# Patient Record
Sex: Male | Born: 1955 | Race: White | Hispanic: No | Marital: Married | Smoking: Current every day smoker
Health system: Southern US, Community
[De-identification: ages and names within clinical notes are randomized; demographics above are authoritative.]

## PROBLEM LIST (undated history)

## (undated) DIAGNOSIS — E559 Vitamin D deficiency, unspecified: Secondary | ICD-10-CM

## (undated) DIAGNOSIS — Z96659 Presence of unspecified artificial knee joint: Secondary | ICD-10-CM

## (undated) DIAGNOSIS — I739 Peripheral vascular disease, unspecified: Secondary | ICD-10-CM

## (undated) DIAGNOSIS — F419 Anxiety disorder, unspecified: Secondary | ICD-10-CM

## (undated) DIAGNOSIS — J45909 Unspecified asthma, uncomplicated: Secondary | ICD-10-CM

## (undated) DIAGNOSIS — I10 Essential (primary) hypertension: Secondary | ICD-10-CM

## (undated) DIAGNOSIS — I2510 Atherosclerotic heart disease of native coronary artery without angina pectoris: Secondary | ICD-10-CM

## (undated) DIAGNOSIS — R51 Headache: Secondary | ICD-10-CM

## (undated) DIAGNOSIS — K649 Unspecified hemorrhoids: Secondary | ICD-10-CM

## (undated) DIAGNOSIS — N400 Enlarged prostate without lower urinary tract symptoms: Secondary | ICD-10-CM

## (undated) DIAGNOSIS — R519 Headache, unspecified: Secondary | ICD-10-CM

## (undated) DIAGNOSIS — R7303 Prediabetes: Secondary | ICD-10-CM

## (undated) DIAGNOSIS — J449 Chronic obstructive pulmonary disease, unspecified: Secondary | ICD-10-CM

## (undated) DIAGNOSIS — K219 Gastro-esophageal reflux disease without esophagitis: Secondary | ICD-10-CM

## (undated) DIAGNOSIS — M5030 Other cervical disc degeneration, unspecified cervical region: Secondary | ICD-10-CM

## (undated) DIAGNOSIS — G4700 Insomnia, unspecified: Secondary | ICD-10-CM

## (undated) DIAGNOSIS — M1711 Unilateral primary osteoarthritis, right knee: Secondary | ICD-10-CM

## (undated) DIAGNOSIS — E785 Hyperlipidemia, unspecified: Secondary | ICD-10-CM

## (undated) DIAGNOSIS — F330 Major depressive disorder, recurrent, mild: Secondary | ICD-10-CM

## (undated) DIAGNOSIS — K429 Umbilical hernia without obstruction or gangrene: Secondary | ICD-10-CM

## (undated) DIAGNOSIS — M51369 Other intervertebral disc degeneration, lumbar region without mention of lumbar back pain or lower extremity pain: Secondary | ICD-10-CM

## (undated) DIAGNOSIS — M5136 Other intervertebral disc degeneration, lumbar region: Secondary | ICD-10-CM

## (undated) DIAGNOSIS — K2270 Barrett's esophagus without dysplasia: Secondary | ICD-10-CM

## (undated) DIAGNOSIS — I77819 Aortic ectasia, unspecified site: Secondary | ICD-10-CM

## (undated) DIAGNOSIS — G629 Polyneuropathy, unspecified: Secondary | ICD-10-CM

## (undated) DIAGNOSIS — T8459XA Infection and inflammatory reaction due to other internal joint prosthesis, initial encounter: Secondary | ICD-10-CM

## (undated) DIAGNOSIS — F17200 Nicotine dependence, unspecified, uncomplicated: Secondary | ICD-10-CM

## (undated) DIAGNOSIS — M1990 Unspecified osteoarthritis, unspecified site: Secondary | ICD-10-CM

## (undated) DIAGNOSIS — Z720 Tobacco use: Secondary | ICD-10-CM

## (undated) DIAGNOSIS — G609 Hereditary and idiopathic neuropathy, unspecified: Secondary | ICD-10-CM

## (undated) DIAGNOSIS — J329 Chronic sinusitis, unspecified: Secondary | ICD-10-CM

## (undated) HISTORY — PX: REPLACEMENT UNICONDYLAR JOINT KNEE: SUR1227

## (undated) HISTORY — PX: APPENDECTOMY: SHX54

## (undated) HISTORY — PX: OTHER SURGICAL HISTORY: SHX169

## (undated) HISTORY — PX: SUPERFICIAL PERONEAL NERVE RELEASE: SHX6200

## (undated) HISTORY — PX: BACK SURGERY: SHX140

## (undated) HISTORY — PX: HERNIA REPAIR: SHX51

## (undated) HISTORY — PX: JOINT REPLACEMENT: SHX530

## (undated) HISTORY — PX: KNEE ARTHROSCOPY: SUR90

---

## 2005-08-25 ENCOUNTER — Emergency Department: Payer: Self-pay | Admitting: Emergency Medicine

## 2005-11-09 ENCOUNTER — Emergency Department: Payer: Self-pay | Admitting: Emergency Medicine

## 2006-01-08 ENCOUNTER — Ambulatory Visit: Payer: Self-pay | Admitting: Gastroenterology

## 2006-08-17 ENCOUNTER — Ambulatory Visit: Payer: Self-pay | Admitting: Urology

## 2006-10-10 ENCOUNTER — Ambulatory Visit: Payer: Self-pay | Admitting: Family Medicine

## 2007-03-16 ENCOUNTER — Ambulatory Visit: Payer: Self-pay | Admitting: Family Medicine

## 2007-03-19 ENCOUNTER — Ambulatory Visit: Payer: Self-pay | Admitting: Family Medicine

## 2007-10-20 ENCOUNTER — Ambulatory Visit: Payer: Self-pay | Admitting: Unknown Physician Specialty

## 2007-12-13 ENCOUNTER — Ambulatory Visit: Payer: Self-pay | Admitting: Pain Medicine

## 2008-03-18 ENCOUNTER — Other Ambulatory Visit: Payer: Self-pay

## 2008-03-18 ENCOUNTER — Emergency Department: Payer: Self-pay | Admitting: Emergency Medicine

## 2008-04-06 ENCOUNTER — Ambulatory Visit: Payer: Self-pay | Admitting: Unknown Physician Specialty

## 2008-04-12 ENCOUNTER — Encounter: Payer: Self-pay | Admitting: Unknown Physician Specialty

## 2008-05-06 ENCOUNTER — Encounter: Payer: Self-pay | Admitting: Unknown Physician Specialty

## 2008-07-17 ENCOUNTER — Ambulatory Visit: Payer: Self-pay | Admitting: Psychology

## 2008-08-03 ENCOUNTER — Ambulatory Visit: Payer: Self-pay | Admitting: Internal Medicine

## 2008-08-30 ENCOUNTER — Ambulatory Visit: Payer: Self-pay | Admitting: Gastroenterology

## 2008-09-14 ENCOUNTER — Ambulatory Visit: Payer: Self-pay | Admitting: Gastroenterology

## 2008-11-09 ENCOUNTER — Ambulatory Visit: Payer: Self-pay | Admitting: Surgery

## 2009-06-21 ENCOUNTER — Emergency Department: Payer: Self-pay | Admitting: Emergency Medicine

## 2009-10-02 ENCOUNTER — Ambulatory Visit: Payer: Self-pay | Admitting: Unknown Physician Specialty

## 2009-10-06 ENCOUNTER — Ambulatory Visit: Payer: Self-pay | Admitting: Internal Medicine

## 2009-10-07 ENCOUNTER — Emergency Department: Payer: Self-pay | Admitting: Unknown Physician Specialty

## 2009-10-12 ENCOUNTER — Ambulatory Visit: Payer: Self-pay | Admitting: Internal Medicine

## 2009-10-17 ENCOUNTER — Ambulatory Visit: Payer: Self-pay | Admitting: Internal Medicine

## 2009-11-06 ENCOUNTER — Ambulatory Visit: Payer: Self-pay | Admitting: Internal Medicine

## 2010-01-04 ENCOUNTER — Ambulatory Visit: Payer: Self-pay | Admitting: Gastroenterology

## 2011-10-13 ENCOUNTER — Ambulatory Visit: Payer: Self-pay | Admitting: Family Medicine

## 2011-10-31 ENCOUNTER — Ambulatory Visit: Payer: Self-pay | Admitting: Gastroenterology

## 2011-11-20 ENCOUNTER — Ambulatory Visit: Payer: Self-pay | Admitting: Gastroenterology

## 2011-11-24 LAB — PATHOLOGY REPORT

## 2012-01-27 ENCOUNTER — Ambulatory Visit: Payer: Self-pay | Admitting: Gastroenterology

## 2012-02-03 ENCOUNTER — Ambulatory Visit: Payer: Self-pay | Admitting: Gastroenterology

## 2012-04-19 ENCOUNTER — Inpatient Hospital Stay: Payer: Self-pay | Admitting: Internal Medicine

## 2012-04-19 LAB — COMPREHENSIVE METABOLIC PANEL
Anion Gap: 11 (ref 7–16)
BUN: 12 mg/dL (ref 7–18)
Bilirubin,Total: 0.8 mg/dL (ref 0.2–1.0)
Chloride: 101 mmol/L (ref 98–107)
Co2: 27 mmol/L (ref 21–32)
Creatinine: 2.5 mg/dL — ABNORMAL HIGH (ref 0.60–1.30)
EGFR (African American): 32 — ABNORMAL LOW
Potassium: 3.6 mmol/L (ref 3.5–5.1)
Sodium: 139 mmol/L (ref 136–145)
Total Protein: 7.6 g/dL (ref 6.4–8.2)

## 2012-04-19 LAB — CK TOTAL AND CKMB (NOT AT ARMC): CK, Total: 226 U/L (ref 35–232)

## 2012-04-19 LAB — CBC
HCT: 48.6 % (ref 40.0–52.0)
MCHC: 34.1 g/dL (ref 32.0–36.0)
RDW: 13.6 % (ref 11.5–14.5)

## 2012-04-19 LAB — URINALYSIS, COMPLETE
Blood: NEGATIVE
Glucose,UR: NEGATIVE mg/dL (ref 0–75)
Hyaline Cast: 37
Ph: 5 (ref 4.5–8.0)
Squamous Epithelial: <1

## 2012-04-20 LAB — CBC WITH DIFFERENTIAL/PLATELET
Basophil #: 0.1 10*3/uL (ref 0.0–0.1)
Basophil %: 1.7 %
Eosinophil #: 0.2 10*3/uL (ref 0.0–0.7)
HCT: 44.4 % (ref 40.0–52.0)
HGB: 15.3 g/dL (ref 13.0–18.0)
Lymphocyte #: 2.2 10*3/uL (ref 1.0–3.6)
MCH: 34.6 pg — ABNORMAL HIGH (ref 26.0–34.0)
MCHC: 34.5 g/dL (ref 32.0–36.0)
MCV: 100 fL (ref 80–100)
Monocyte #: 0.5 x10 3/mm (ref 0.2–1.0)
Neutrophil #: 2.6 10*3/uL (ref 1.4–6.5)
RBC: 4.42 10*6/uL (ref 4.40–5.90)
RDW: 13.7 % (ref 11.5–14.5)
WBC: 5.6 10*3/uL (ref 3.8–10.6)

## 2012-04-20 LAB — BASIC METABOLIC PANEL
Calcium, Total: 8.6 mg/dL (ref 8.5–10.1)
Chloride: 106 mmol/L (ref 98–107)
EGFR (African American): >60
EGFR (Non-African Amer.): >60
Glucose: 119 mg/dL — ABNORMAL HIGH (ref 65–99)
Sodium: 142 mmol/L (ref 136–145)

## 2012-04-20 LAB — TROPONIN I: Troponin-I: <0.02 ng/mL

## 2012-04-25 LAB — CULTURE, BLOOD (SINGLE)

## 2012-10-13 ENCOUNTER — Other Ambulatory Visit: Payer: Self-pay | Admitting: Neurology

## 2012-10-13 DIAGNOSIS — M5430 Sciatica, unspecified side: Secondary | ICD-10-CM

## 2012-10-22 ENCOUNTER — Ambulatory Visit
Admission: RE | Admit: 2012-10-22 | Discharge: 2012-10-22 | Disposition: A | Discharge status: Home / Self Care | Payer: PRIVATE HEALTH INSURANCE | Source: Ambulatory Visit | Attending: Neurology | Admitting: Neurology

## 2012-10-22 DIAGNOSIS — M5430 Sciatica, unspecified side: Secondary | ICD-10-CM

## 2013-03-03 ENCOUNTER — Telehealth: Payer: Self-pay | Admitting: Neurology

## 2013-11-23 IMAGING — US US ABDOMEN LIMITED SLG ORGAN/ASCITES
1 series · 17 of 25 positions shown · non-contrast
Comparison: none

REASON FOR EXAM: RUQ abd pain gallbladder
COMMENTS:

[Series 1: us abdomen limited slg organ/ascites · 17 of 57 slices shown]
[im 1/57]
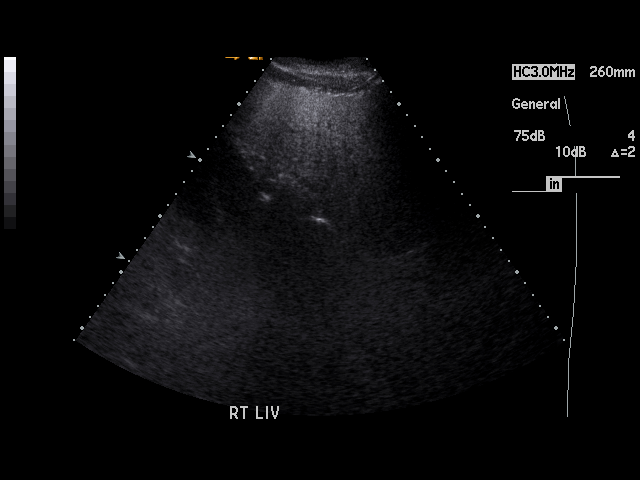
[im 5/57]
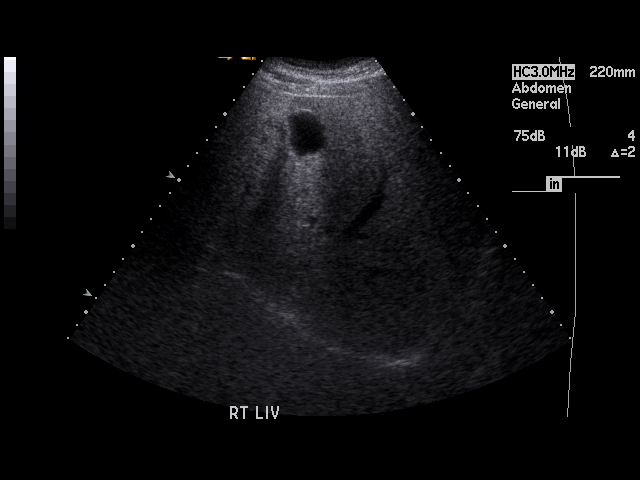
[im 8/57]
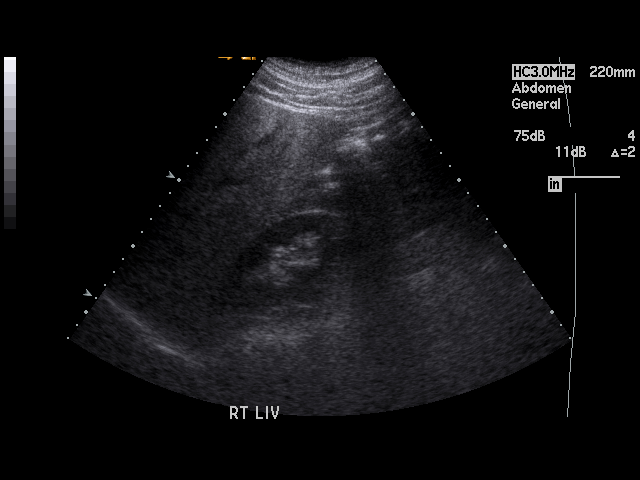
[im 12/57]
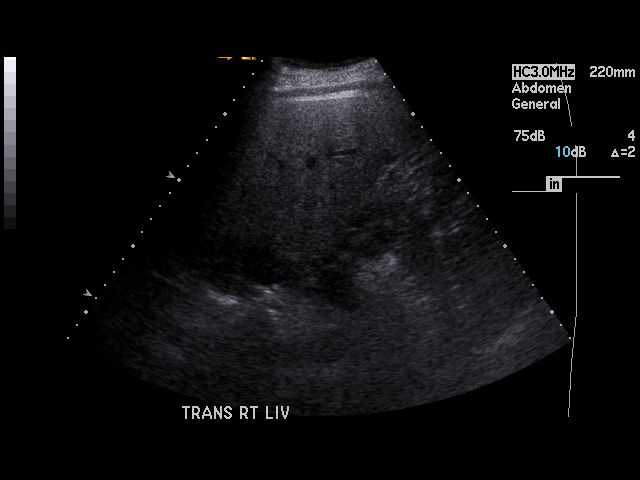
[im 15/57]
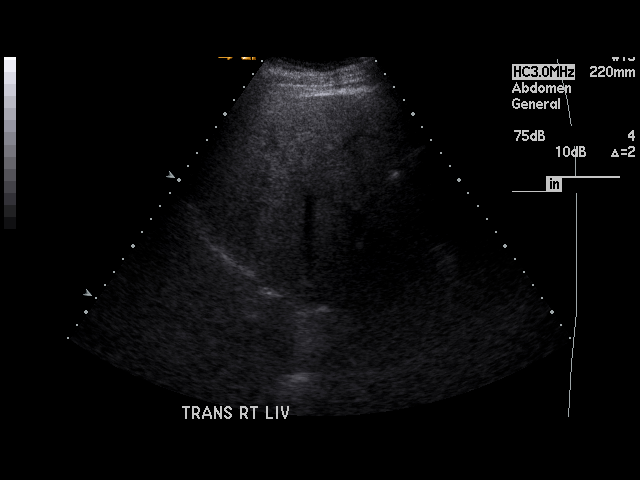
[im 19/57]
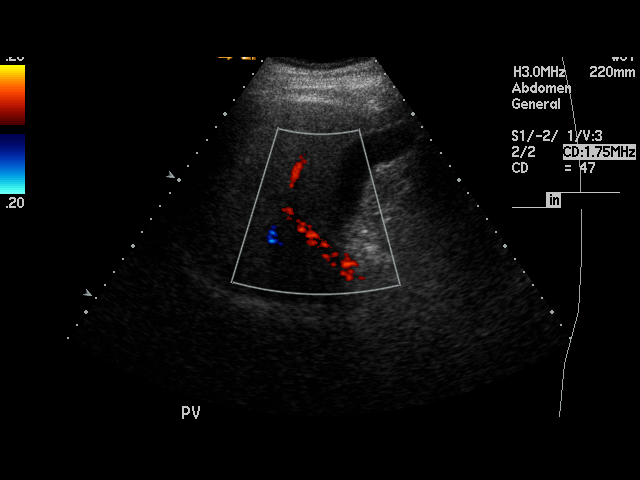
[im 22/57]
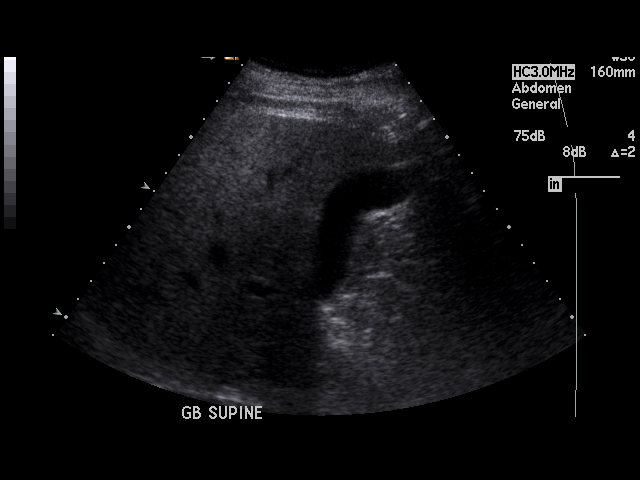
[im 26/57]
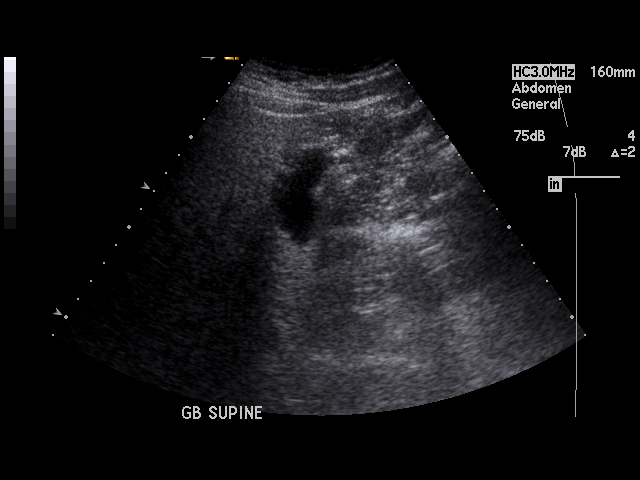
[im 29/57]
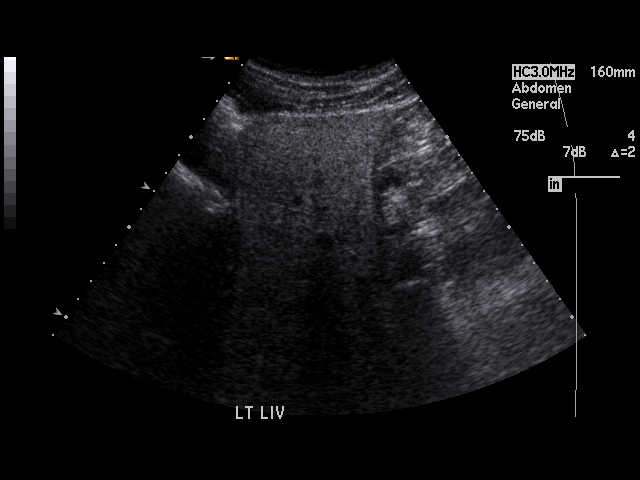
[im 31/57]
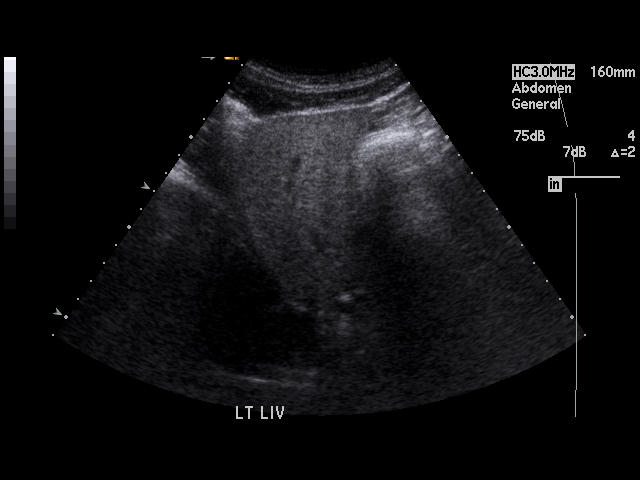
[im 36/57]
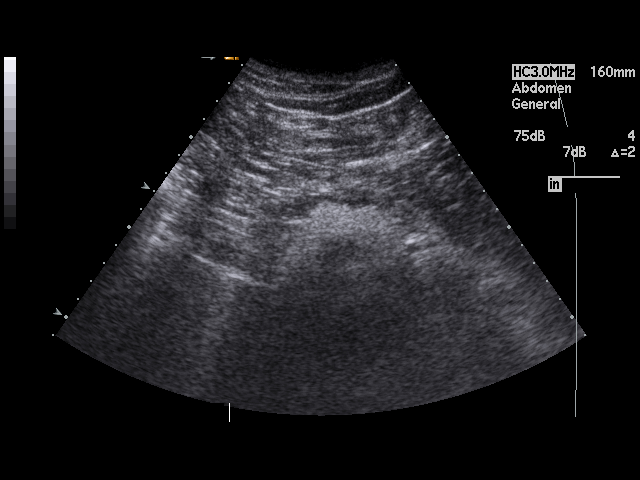
[im 38/57]
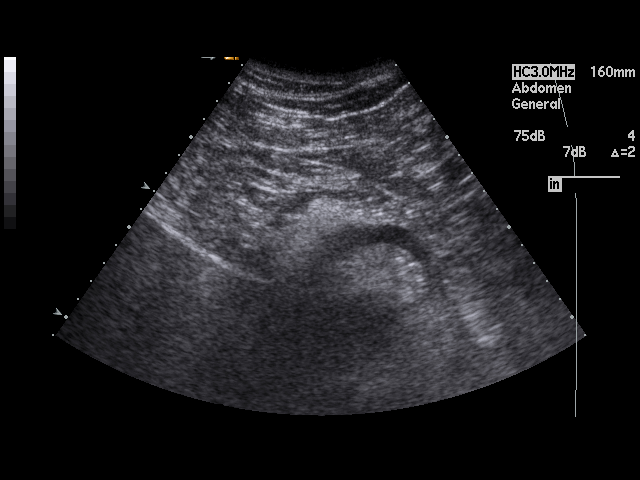
[im 43/57]
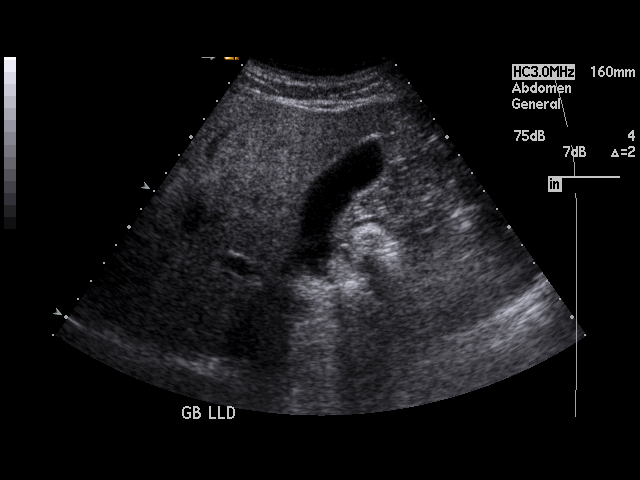
[im 45/57]
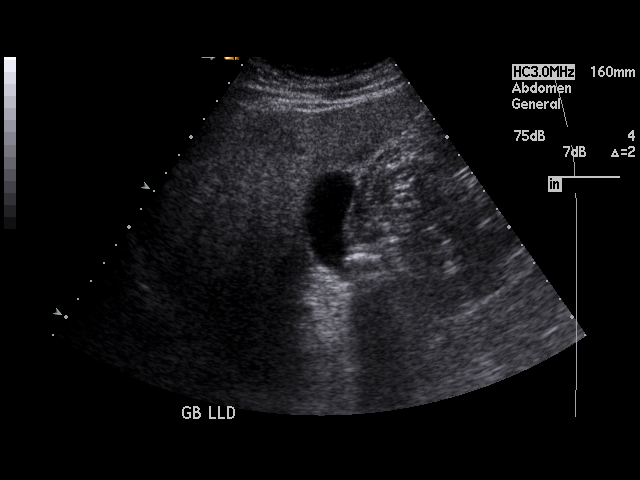
[im 50/57]
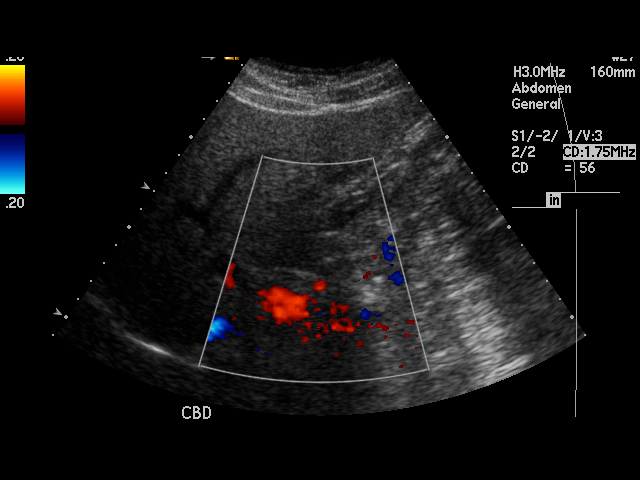
[im 52/57]
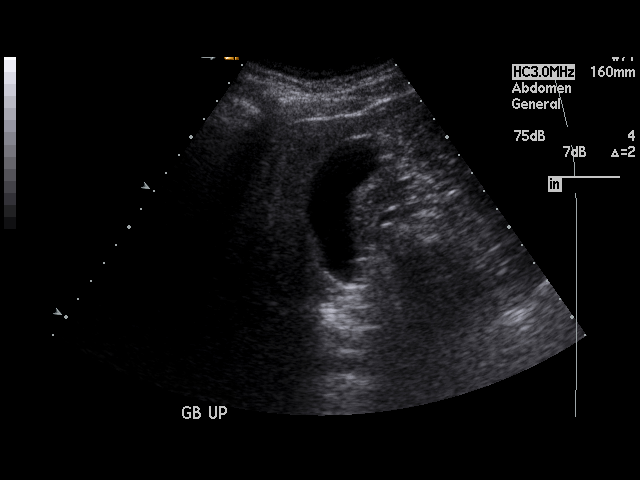
[im 57/57]
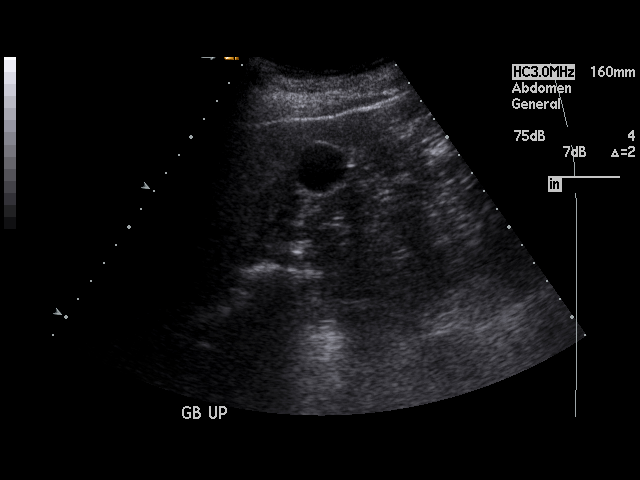

[17 of 25 positions shown; findings below may reference images not displayed]

PROCEDURE:     BRAIN - BRAIN ABDOMEN LTD 1 ORGAN OR QUAD  - October 13, 2011  [DATE]

RESULT:     Limited abdominal ultrasound examination was performed for
evaluation of the biliary tract. No gallstones are seen. There is no
thickening of the gallbladder wall. Common bile duct measures 4.8 mm in
number which is within normal limits. The pancreas is not visualized
adequately for evaluation. The region of the porta hepatis shows no
significant abnormalities. The liver appears hyperechogenic, suspicious for
fatty infiltration.
IMPRESSION: 1. No gallstones or other acute change is identified.
2. Possible fatty infiltration of the liver.

## 2013-12-14 NOTE — Telephone Encounter (Signed)
Pt never called to schedule an apt. Closing encounter

## 2014-03-21 DIAGNOSIS — G609 Hereditary and idiopathic neuropathy, unspecified: Secondary | ICD-10-CM | POA: Insufficient documentation

## 2014-03-21 DIAGNOSIS — I10 Essential (primary) hypertension: Secondary | ICD-10-CM | POA: Insufficient documentation

## 2014-03-21 DIAGNOSIS — K649 Unspecified hemorrhoids: Secondary | ICD-10-CM | POA: Insufficient documentation

## 2014-03-21 DIAGNOSIS — E559 Vitamin D deficiency, unspecified: Secondary | ICD-10-CM | POA: Insufficient documentation

## 2014-03-21 DIAGNOSIS — J449 Chronic obstructive pulmonary disease, unspecified: Secondary | ICD-10-CM | POA: Insufficient documentation

## 2014-04-27 ENCOUNTER — Emergency Department: Payer: Self-pay | Admitting: Emergency Medicine

## 2014-04-27 LAB — URINALYSIS, COMPLETE
BILIRUBIN, UR: NEGATIVE
Bacteria: NONE SEEN
Blood: NEGATIVE
GLUCOSE, UR: NEGATIVE mg/dL (ref 0–75)
Ketone: NEGATIVE
LEUKOCYTE ESTERASE: NEGATIVE
Nitrite: NEGATIVE
PROTEIN: NEGATIVE
Ph: 7 (ref 4.5–8.0)
RBC,UR: <1 /HPF (ref 0–5)
SQUAMOUS EPITHELIAL: NONE SEEN
Specific Gravity: 1.004 (ref 1.003–1.030)

## 2014-04-27 LAB — CBC WITH DIFFERENTIAL/PLATELET
Basophil #: 0.2 10*3/uL — ABNORMAL HIGH (ref 0.0–0.1)
Basophil %: 2.1 %
Eosinophil #: 0.2 10*3/uL (ref 0.0–0.7)
Eosinophil %: 2.2 %
HCT: 45 % (ref 40.0–52.0)
HGB: 15.2 g/dL (ref 13.0–18.0)
LYMPHS PCT: 24.2 %
Lymphocyte #: 1.8 10*3/uL (ref 1.0–3.6)
MCH: 34.5 pg — ABNORMAL HIGH (ref 26.0–34.0)
MCHC: 33.8 g/dL (ref 32.0–36.0)
MCV: 102 fL — ABNORMAL HIGH (ref 80–100)
MONOS PCT: 8.5 %
Monocyte #: 0.7 x10 3/mm (ref 0.2–1.0)
Neutrophil #: 4.8 10*3/uL (ref 1.4–6.5)
Neutrophil %: 63 %
PLATELETS: 177 10*3/uL (ref 150–440)
RBC: 4.41 10*6/uL (ref 4.40–5.90)
RDW: 13.9 % (ref 11.5–14.5)
WBC: 7.6 10*3/uL (ref 3.8–10.6)

## 2014-04-27 LAB — COMPREHENSIVE METABOLIC PANEL
ALT: 37 U/L
ANION GAP: 6 — AB (ref 7–16)
Albumin: 3.6 g/dL (ref 3.4–5.0)
Alkaline Phosphatase: 80 U/L
BILIRUBIN TOTAL: 0.5 mg/dL (ref 0.2–1.0)
BUN: 11 mg/dL (ref 7–18)
CHLORIDE: 107 mmol/L (ref 98–107)
Calcium, Total: 8.5 mg/dL (ref 8.5–10.1)
Co2: 25 mmol/L (ref 21–32)
Creatinine: 0.93 mg/dL (ref 0.60–1.30)
EGFR (African American): >60
Glucose: 118 mg/dL — ABNORMAL HIGH (ref 65–99)
Osmolality: 276 (ref 275–301)
POTASSIUM: 3.7 mmol/L (ref 3.5–5.1)
SGOT(AST): 25 U/L (ref 15–37)
Sodium: 138 mmol/L (ref 136–145)
Total Protein: 7.3 g/dL (ref 6.4–8.2)

## 2014-04-27 LAB — LIPASE, BLOOD: Lipase: 98 U/L (ref 73–393)

## 2014-04-27 LAB — TROPONIN I: Troponin-I: <0.02 ng/mL

## 2014-04-27 LAB — MAGNESIUM: Magnesium: 2.2 mg/dL

## 2014-07-22 ENCOUNTER — Emergency Department: Payer: Self-pay | Admitting: Emergency Medicine

## 2014-07-22 LAB — URINALYSIS, COMPLETE
Bacteria: NONE SEEN
Bilirubin,UR: NEGATIVE
Blood: NEGATIVE
GLUCOSE, UR: NEGATIVE mg/dL (ref 0–75)
KETONE: NEGATIVE
LEUKOCYTE ESTERASE: NEGATIVE
Nitrite: NEGATIVE
PROTEIN: NEGATIVE
Ph: 6 (ref 4.5–8.0)
RBC,UR: <1 /HPF (ref 0–5)
Specific Gravity: 1.005 (ref 1.003–1.030)
Squamous Epithelial: NONE SEEN
WBC UR: <1 /HPF (ref 0–5)

## 2015-01-12 ENCOUNTER — Ambulatory Visit
Admit: 2015-01-12 | Disposition: A | Discharge status: Home / Self Care | Payer: Self-pay | Attending: Gastroenterology | Admitting: Gastroenterology

## 2015-01-12 LAB — KOH PREP

## 2015-01-23 NOTE — Discharge Summary (Signed)
PATIENT NAME:  Anthony Bender, Anthony Bender MR#:  [id number] DATE OF BIRTH:  [birth year]  DATE OF ADMISSION:  04/19/2012 DATE OF DISCHARGE:  04/20/2012  DISCHARGE DIAGNOSES:  1. Acute renal failure, likely prerenal in nature, now resolved status post aggressive IV hydration. Stopped hydrochlorothiazide, which could be a contributing factor.  2. Hypokalemia, repleted.   SECONDARY DIAGNOSES:  1. Hypertension.  2. Chronic obstructive pulmonary disease.  3. Gastroesophageal reflux disease.  4. Hiatal hernia.   CONSULTATIONS: None.   PROCEDURES/RADIOLOGY:  1. Chest x-ray on 07/15 showed minimal bibasilar atelectasis.  2. Bilateral renal ultrasound on 07/16 showed no hydronephrosis.  3. Left lower extremity Doppler on 07/15 showed no evidence of deep vein thrombosis.   MAJOR LABORATORY PANEL: Urinalysis on admission was negative.   HISTORY AND SHORT HOSPITAL COURSE: The patient is a 59 year old male with above-mentioned medical problems who was admitted for acute renal failure thought to be secondary to prerenal etiology with dehydration and use of hydrochlorothiazide. The patient was having poor p.o. intake recently. Please see Dr. Marshia Ly dictated history and physical for further details. The patient was hydrated aggressively with IV fluids and his hydrochlorothiazide/losartan combination was held. His blood pressure was stable and he is renal function normalized. His symptoms were also significantly improved with aggressive IV hydration. He had some hypokalemia, which was repleted, and he was feeling much better and was discharged home in stable condition.   VITAL SIGNS: On the date of discharge, his vital signs are as follows: Temperature 97.9, heart rate 72 per minute, respirations 20 per minute, blood pressure 126/85 mmHg. He was saturating 96% on room air.   PERTINENT PHYSICAL EXAMINATION: On the date of discharge: CARDIOVASCULAR: S1, S2 normal. No murmur, rubs, gallop. LUNGS: Clear to auscultation  bilaterally. No wheezes, rales, rhonchi, or crepitation. ABDOMEN: Soft, benign. NEUROLOGIC: Nonfocal examination. All other physical examination remained at baseline.   DISCHARGE MEDICATIONS: 1. Nexium 40 mg p.o. b.i.d.  2. Spiriva once daily.  3. Advair 250/50, 1 puff b.i.d.  4. Ventolin 2 puffs inhaled every four hours as needed.  5. Neurontin 600 mg p.o. 3 times a day.  6. Ambien 10 mg p.o. at bedtime.  7. Gemfibrozil twice a day. 8. Losartan 50 mg p.o. daily.   DISCHARGE DIET: Low sodium.   DISCHARGE ACTIVITY: As tolerated.  DISCHARGE INSTRUCTIONS AND FOLLOWUP:  1. The patient was instructed to stop his hydrochlorothiazide/losartan combination.  2. He was instructed to follow up with his primary care physician, Dr. Juluis Pitch, in 1 to 2 weeks.   TOTAL TIME DISCHARGING THIS PATIENT: 50 minutes. ____________________________ Lucina Mellow. Manuella Ghazi, MD vss:bjt D: 04/20/2012 20:51:55 ET T: 04/21/2012 12:31:59 ET JOB#: [id number]  cc: Aikeem Lilley S. Manuella Ghazi, MD, <Dictator> Youlanda Roys. Lovie Macadamia, MD Remer Macho MD ELECTRONICALLY SIGNED 04/21/2012 16:50

## 2015-01-23 NOTE — H&P (Signed)
PATIENT NAME:  Anthony Bender, Anthony Bender MR#:  [id number] DATE OF BIRTH:  Jul 22, 1956  DATE OF ADMISSION:  04/20/2012  PRIMARY CARE PHYSICIAN: Dr. Lovie Macadamia   CHIEF COMPLAINT: Dizziness.   HISTORY OF PRESENT ILLNESS: This is a 59 year old man with history of hypertension, who takes lisinopril hydrochlorothiazide. He got dizzy. He went to take his blood pressure. It was 75/56. He had some neck pain, center of his back pain and shoulder blade pain and leg pain at the time. These pains got better after IV fluid hydration and eased up around 6:00 p.m. Patient was in the Emergency Room for over five hours, had an ultrasound of the left upper extremity that was negative for deep vein thrombosis. He was found in acute renal failure and he was hypotensive He has been feeling fatigued. Hospitalist services were contacted for further evaluation.   PAST MEDICAL HISTORY:  1. Chronic obstructive pulmonary disease.  2. Hypertension.  3. Neuropathy of the feet. 4. Gastroesophageal reflux disease. 5. Hiatal hernia.   PAST SURGICAL HISTORY:  1. Four knee arthroscopies.   2. Appendix.  3. Inguinal hernia. 4. Fatty tumor on the back. 5. Right arm nerve release. 6. Fifth finger on the left hand reattachment.   ALLERGIES: Codeine and Zyban.   MEDICATIONS:  1. Advair Diskus 250/50, 1 inhalation twice a day. 2. Hydrochlorothiazide/losartan 12.5/50 mg daily.  3. Neurontin 600 mg 3 times a day.  4. Nexium 40 mg twice a day.  5. Spiriva 18 mcg 1 inhalation daily.  6. Ventolin HFA 2 puffs every four hours as needed for wheezing.  7. Ambien 10 mg at bedtime.   SOCIAL HISTORY: Smokes half pack per day. No alcohol. No drug use. Works as a Air traffic controller.   FAMILY HISTORY: Mother living with CVA. He had tuberculosis as a child. Father died of an aneurysm in the chest or abdomen. He had chronic obstructive pulmonary disease and prostate cancer.   REVIEW OF SYSTEMS: CONSTITUTIONAL: Positive for fatigue. No  fever, chills, or sweats. No weight gain. No weight loss. EYES: He does wear glasses. EARS, NOSE, MOUTH, AND THROAT: Positive for ringing in the ears. Positive for jaw tightness while trying to eat today. CARDIOVASCULAR: No chest pain. Positive for palpitations. RESPIRATORY: Positive for shortness of breath. No coughing. No sputum. No hemoptysis. GASTROINTESTINAL: No nausea. No vomiting. No abdominal pain. No diarrhea. No constipation. No bright red blood per rectum. No melena. GENITOURINARY: No burning on urination. Decreased urination today. MUSCULOSKELETAL: Positive for joint pains. INTEGUMENT: Positive for itching on the back. NEUROLOGIC: No fainting or blackouts. PSYCHIATRIC: No anxiety, depression. ENDOCRINE: No thyroid problems. HEMATOLOGIC/LYMPHATIC: No anemia. No easy bruising or bleeding.   PHYSICAL EXAMINATION:  VITAL SIGNS: Temperature 98.5, pulse 70, respirations 18, blood pressure 101/60, pulse oximetry 96%, on presentation blood pressure was 101/59.   GENERAL: No respiratory distress.   EYES: Conjunctivae and lids normal. Pupils equal, round, and reactive to light. Extraocular muscles intact. No nystagmus.   EARS, NOSE, MOUTH, AND THROAT: Tympanic membranes no erythema. Nasal mucosa no erythema. Throat no erythema. No exudate seen. Lips and gums no lesions.   NECK: No JVD. No bruits. No lymphadenopathy. No thyromegaly. No thyroid nodules palpated.   RESPIRATORY: Lungs clear to auscultation. No use of accessory muscles to breathe. No rhonchi, rales, or wheeze heard.   CARDIOVASCULAR: S1, S2 normal. No gallops, rubs, or murmurs heard. Carotid upstroke 2+ bilaterally. No bruits.   EXTREMITIES: Dorsalis pedis pulses 2+ bilaterally. All pulses equal throughout upper and lower  extremities.   ABDOMEN: Soft, nontender. No organomegaly/splenomegaly. Normoactive bowel sounds. No masses felt.   LYMPHATIC: No lymph nodes in the neck.   MUSCULOSKELETAL: No clubbing, edema, or cyanosis.    SKIN: No rashes or ulcers seen.   NEUROLOGIC: Cranial nerves II through XII grossly intact. Deep tendon reflexes 2+ bilateral lower extremities.   PSYCHIATRIC: Patient is oriented to person, place, and time.   LABORATORY, DIAGNOSTIC, AND RADIOLOGICAL DATA: EKG normal sinus rhythm, 98 beats per minute, no acute ST-T wave changes. Left atrial enlargement. Ultrasound the left lower extremity negative for deep vein thrombosis. White blood cell count 11.3, hemoglobin and hematocrit 16.6 and 48.8, platelet count 208, MCV 101, glucose 128, BUN 12, creatinine 2.5, sodium 139, potassium 3.6, chloride 101, CO2 27, calcium 9.0. Liver function tests: AST slightly elevated at 38, GFR 27. Troponin negative. Urinalysis 1+ bacteria, nitrites and leukocyte esterase negative, ketones trace. D-dimer negative.   ASSESSMENT AND PLAN:  1. Acute renal failure with hypotension, probable ATN. Will give IV fluid hydration. Patient received 2 liters open, will give 125 mL/h. Will hold lisinopril/HCT. Will get a renal ultrasound, strict ins and outs. Hospital course will depend on how his kidney function and blood pressure respond.  2. Chronic obstructive pulmonary disease. Respiratory status stable. Continue Advair and Spiriva and Ventolin inhaler.  3. Tobacco abuse. Smoking cessation counseling done, three minutes by me. Nicotine patch applied.  4. Gastroesophageal reflux disease and hiatal hernia. Continue Nexium.  5. Neuropathy. Continue gabapentin.   TIME SPENT ON ADMISSION: 50 minutes.   ____________________________ Tana Conch. Leslye Peer, MD rjw:cms D: 04/19/2012 23:19:39 ET T: 04/20/2012 09:14:04 ET JOB#: [id number]  cc: Tana Conch. Leslye Peer, MD, <Dictator> Youlanda Roys. Lovie Macadamia, MD Marisue Brooklyn MD ELECTRONICALLY SIGNED 04/24/2012 16:03

## 2015-01-29 LAB — SURGICAL PATHOLOGY

## 2015-02-26 ENCOUNTER — Other Ambulatory Visit: Payer: Self-pay | Admitting: Unknown Physician Specialty

## 2015-02-26 DIAGNOSIS — M25562 Pain in left knee: Secondary | ICD-10-CM

## 2015-02-26 DIAGNOSIS — M25561 Pain in right knee: Secondary | ICD-10-CM

## 2015-02-26 DIAGNOSIS — M170 Bilateral primary osteoarthritis of knee: Secondary | ICD-10-CM

## 2015-03-02 ENCOUNTER — Ambulatory Visit: Payer: BLUE CROSS/BLUE SHIELD

## 2015-03-02 ENCOUNTER — Ambulatory Visit: Admission: RE | Admit: 2015-03-02 | Payer: BLUE CROSS/BLUE SHIELD | Source: Ambulatory Visit

## 2015-03-07 DIAGNOSIS — M1711 Unilateral primary osteoarthritis, right knee: Secondary | ICD-10-CM | POA: Insufficient documentation

## 2015-03-13 ENCOUNTER — Encounter: Payer: Self-pay | Admitting: *Deleted

## 2015-03-15 ENCOUNTER — Encounter
Admission: RE | Disposition: A | Discharge status: Home / Self Care | Payer: Self-pay | Source: Ambulatory Visit | Attending: Gastroenterology

## 2015-03-15 ENCOUNTER — Ambulatory Visit: Payer: BLUE CROSS/BLUE SHIELD | Admitting: Anesthesiology

## 2015-03-15 ENCOUNTER — Ambulatory Visit
Admission: RE | Admit: 2015-03-15 | Discharge: 2015-03-15 | Disposition: A | Discharge status: Home / Self Care | Payer: BLUE CROSS/BLUE SHIELD | Source: Ambulatory Visit | Attending: Gastroenterology | Admitting: Gastroenterology

## 2015-03-15 DIAGNOSIS — G629 Polyneuropathy, unspecified: Secondary | ICD-10-CM | POA: Insufficient documentation

## 2015-03-15 DIAGNOSIS — I10 Essential (primary) hypertension: Secondary | ICD-10-CM | POA: Insufficient documentation

## 2015-03-15 DIAGNOSIS — N400 Enlarged prostate without lower urinary tract symptoms: Secondary | ICD-10-CM | POA: Insufficient documentation

## 2015-03-15 DIAGNOSIS — M1990 Unspecified osteoarthritis, unspecified site: Secondary | ICD-10-CM | POA: Insufficient documentation

## 2015-03-15 DIAGNOSIS — Z791 Long term (current) use of non-steroidal anti-inflammatories (NSAID): Secondary | ICD-10-CM | POA: Diagnosis not present

## 2015-03-15 DIAGNOSIS — K219 Gastro-esophageal reflux disease without esophagitis: Secondary | ICD-10-CM | POA: Insufficient documentation

## 2015-03-15 DIAGNOSIS — K2270 Barrett's esophagus without dysplasia: Secondary | ICD-10-CM | POA: Insufficient documentation

## 2015-03-15 DIAGNOSIS — E785 Hyperlipidemia, unspecified: Secondary | ICD-10-CM | POA: Insufficient documentation

## 2015-03-15 DIAGNOSIS — J45909 Unspecified asthma, uncomplicated: Secondary | ICD-10-CM | POA: Insufficient documentation

## 2015-03-15 DIAGNOSIS — Z79899 Other long term (current) drug therapy: Secondary | ICD-10-CM | POA: Insufficient documentation

## 2015-03-15 DIAGNOSIS — J449 Chronic obstructive pulmonary disease, unspecified: Secondary | ICD-10-CM | POA: Insufficient documentation

## 2015-03-15 HISTORY — DX: Gastro-esophageal reflux disease without esophagitis: K21.9

## 2015-03-15 HISTORY — DX: Hyperlipidemia, unspecified: E78.5

## 2015-03-15 HISTORY — PX: ESOPHAGOGASTRODUODENOSCOPY: SHX5428

## 2015-03-15 HISTORY — DX: Unspecified osteoarthritis, unspecified site: M19.90

## 2015-03-15 HISTORY — DX: Barrett's esophagus without dysplasia: K22.70

## 2015-03-15 HISTORY — DX: Polyneuropathy, unspecified: G62.9

## 2015-03-15 HISTORY — DX: Essential (primary) hypertension: I10

## 2015-03-15 HISTORY — DX: Benign prostatic hyperplasia without lower urinary tract symptoms: N40.0

## 2015-03-15 HISTORY — DX: Unspecified asthma, uncomplicated: J45.909

## 2015-03-15 HISTORY — DX: Chronic obstructive pulmonary disease, unspecified: J44.9

## 2015-03-15 HISTORY — DX: Unspecified hemorrhoids: K64.9

## 2015-03-15 SURGERY — EGD (ESOPHAGOGASTRODUODENOSCOPY)
Anesthesia: General

## 2015-03-15 MED ORDER — SODIUM CHLORIDE 0.9 % IR SOLN: 1000.0000 mL | Status: DC

## 2015-03-15 MED ORDER — SODIUM CHLORIDE 0.9 % IV SOLN
INTRAVENOUS | Status: DC
  Administered 2015-03-15: 11:00:00 via INTRAVENOUS

## 2015-03-15 MED ORDER — LIDOCAINE HCL (CARDIAC) 20 MG/ML IV SOLN
INTRAVENOUS | Status: DC | PRN
  Administered 2015-03-15: 100 mg via INTRAVENOUS

## 2015-03-15 MED ORDER — MIDAZOLAM HCL 5 MG/5ML IJ SOLN
INTRAMUSCULAR | Status: DC | PRN
  Administered 2015-03-15: 1 mg via INTRAVENOUS

## 2015-03-15 MED ORDER — FENTANYL CITRATE (PF) 100 MCG/2ML IJ SOLN
INTRAMUSCULAR | Status: DC | PRN
  Administered 2015-03-15: 50 ug via INTRAVENOUS

## 2015-03-15 MED ORDER — PROPOFOL INFUSION 10 MG/ML OPTIME
INTRAVENOUS | Status: DC | PRN
  Administered 2015-03-15: 75 ug/kg/min via INTRAVENOUS

## 2015-03-15 MED ORDER — STERILE WATER FOR INJECTION IJ SOLN
Freq: Once | INTRAMUSCULAR | Status: AC
  Administered 2015-03-15: 60 mL via OROMUCOSAL
  Filled 2015-03-15: qty 3

## 2015-03-15 MED ORDER — GLYCOPYRROLATE 0.2 MG/ML IJ SOLN
INTRAMUSCULAR | Status: DC | PRN
  Administered 2015-03-15: 0.3 mg via INTRAVENOUS

## 2015-03-15 MED ORDER — ALFENTANIL 500 MCG/ML IJ INJ
INJECTION | INTRAMUSCULAR | Status: DC | PRN
Start: 2015-03-15 — End: 2015-03-15
  Administered 2015-03-15: 500 ug via INTRAVENOUS

## 2015-03-15 MED ORDER — PROPOFOL 10 MG/ML IV BOLUS
INTRAVENOUS | Status: DC | PRN
  Administered 2015-03-15: 70 mg via INTRAVENOUS

## 2015-03-15 NOTE — H&P (Signed)
    Primary Care Physician:  Juluis Pitch, MD Primary Gastroenterologist:  Dr. Candace Cruise  Pre-Procedure History & Physical: HPI:  Anthony Bender is a 59 y.o. male is here for an EGD with Barrx.   Past Medical History  Diagnosis Date  . Barrett's esophagus   . COPD (chronic obstructive pulmonary disease)   . Hypertension   . GERD (gastroesophageal reflux disease)   . Osteoarthritis   . Neuropathy   . Hyperlipidemia   . BPH (benign prostatic hyperplasia)   . Hemorrhoids   . Asthma     No past surgical history on file.  Prior to Admission medications   Medication Sig Start Date End Date Taking? Authorizing Provider  LYRICA 150 MG capsule  01/02/15  Yes Historical Provider, MD  ADVAIR DISKUS 250-50 MCG/DOSE AEPB  01/02/15   Historical Provider, MD  diclofenac (VOLTAREN) 75 MG EC tablet Take 75 mg by mouth 2 (two) times daily. 01/05/15   Historical Provider, MD  esomeprazole (Nelson) 40 MG capsule  02/23/15   Historical Provider, MD  fluticasone Asencion Islam) 50 MCG/ACT nasal spray  01/02/15   Historical Provider, MD  gemfibrozil (LOPID) 600 MG tablet  02/01/15   Historical Provider, MD  losartan (COZAAR) 50 MG tablet  01/28/15   Historical Provider, MD  PROAIR HFA 108 (90 BASE) MCG/ACT inhaler Inhale 2 puffs into the lungs every 6 (six) hours as needed. 12/21/14   Historical Provider, MD  SPIRIVA HANDIHALER 18 MCG inhalation capsule  01/02/15   Historical Provider, MD  zolpidem (AMBIEN) 10 MG tablet Take 10 mg by mouth at bedtime. 02/08/15   Historical Provider, MD    Allergies as of 02/19/2015  . (Not on File)    No family history on file.  History   Social History  . Marital Status: Married    Spouse Name: N/A  . Number of Children: N/A  . Years of Education: N/A   Occupational History  . Not on file.   Social History Main Topics  . Smoking status: Not on file  . Smokeless tobacco: Not on file  . Alcohol Use: Not on file  . Drug Use: Not on file  . Sexual Activity: Not on  file   Other Topics Concern  . Not on file   Social History Narrative  . No narrative on file    Review of Systems: See HPI, otherwise negative ROS  Physical Exam: BP 120/78 mmHg  Pulse 92  Temp(Src) 97.7 F (36.5 C) (Tympanic)  Resp 18  Ht 5\' 9"  (1.753 m)  Wt 93.441 kg (206 lb)  BMI 30.41 kg/m2  SpO2 100% General:   Alert,  pleasant and cooperative in NAD Head:  Normocephalic and atraumatic. Neck:  Supple; no masses or thyromegaly. Lungs:  Clear throughout to auscultation.    Heart:  Regular rate and rhythm. Abdomen:  Soft, nontender and nondistended. Normal bowel sounds, without guarding, and without rebound.   Neurologic:  Alert and  oriented x4;  grossly normal neurologically.  Impression/Plan: Anthony Bender is here for an EGDto be performed for long segment Barrett's.  Risks, benefits, limitations, and alternatives regarding EGD with Barrx have been reviewed with the patient.  Questions have been answered.  All parties agreeable.   Leldon Steege, Lupita Dawn, MD  03/15/2015, 9:40 AM

## 2015-03-15 NOTE — Transfer of Care (Signed)
Immediate Anesthesia Transfer of Care Note  Patient: Anthony Bender  Procedure(s) Performed: Procedure(s): ESOPHAGOGASTRODUODENOSCOPY (EGD) (N/A)  Patient Location: PACU  Anesthesia Type:General  Level of Consciousness: sedated  Airway & Oxygen Therapy: Patient Spontanous Breathing and Patient connected to nasal cannula oxygen  Post-op Assessment: Report given to RN and Post -op Vital signs reviewed and stable  Post vital signs: Reviewed and stable  Last Vitals:  Filed Vitals:   03/15/15 1106  BP: 94/63  Pulse: 91  Temp:   Resp: 16    Complications: No apparent anesthesia complications

## 2015-03-15 NOTE — Anesthesia Preprocedure Evaluation (Signed)
Anesthesia Evaluation  Patient identified by MRN, date of birth, ID band Patient awake    Reviewed: Allergy & Precautions, NPO status , Patient's Chart, lab work & pertinent test results  History of Anesthesia Complications Negative for: history of anesthetic complications  Airway Mallampati: II  TM Distance: >3 FB Neck ROM: Full    Dental no notable dental hx. (+) Edentulous Upper   Pulmonary neg pulmonary ROS, asthma , COPD COPD inhaler,  breath sounds clear to auscultation  Pulmonary exam normal       Cardiovascular Exercise Tolerance: Good hypertension, Pt. on medications Normal cardiovascular examRhythm:Regular Rate:Normal     Neuro/Psych negative neurological ROS  negative psych ROS   GI/Hepatic Neg liver ROS, GERD-  Medicated and Controlled,  Endo/Other  negative endocrine ROS  Renal/GU negative Renal ROS  negative genitourinary   Musculoskeletal  (+) Arthritis -,   Abdominal   Peds negative pediatric ROS (+)  Hematology negative hematology ROS (+)   Anesthesia Other Findings   Reproductive/Obstetrics negative OB ROS                             Anesthesia Physical Anesthesia Plan  ASA: III  Anesthesia Plan: General   Post-op Pain Management:    Induction: Intravenous  Airway Management Planned: Nasal Cannula  Additional Equipment:   Intra-op Plan:   Post-operative Plan:   Informed Consent: I have reviewed the patients History and Physical, chart, labs and discussed the procedure including the risks, benefits and alternatives for the proposed anesthesia with the patient or authorized representative who has indicated his/her understanding and acceptance.     Plan Discussed with: CRNA and Surgeon  Anesthesia Plan Comments:         Anesthesia Quick Evaluation

## 2015-03-15 NOTE — Anesthesia Postprocedure Evaluation (Signed)
  Anesthesia Post-op Note  Patient: Anthony Bender  Procedure(s) Performed: Procedure(s): ESOPHAGOGASTRODUODENOSCOPY (EGD) (N/A)  Anesthesia type:General  Patient location: PACU  Post pain: Pain level controlled  Post assessment: Post-op Vital signs reviewed, Patient's Cardiovascular Status Stable, Respiratory Function Stable, Patent Airway and No signs of Nausea or vomiting  Post vital signs: Reviewed and stable  Last Vitals:  Filed Vitals:   03/15/15 1126  BP: 108/73  Pulse: 83  Temp:   Resp: 15    Level of consciousness: awake, alert  and patient cooperative  Complications: No apparent anesthesia complications

## 2015-03-15 NOTE — Op Note (Signed)
[facility] Gastroenterology Patient Name: Anthony Bender Procedure Date: 03/15/2015 10:30 AM MRN: [id number] Account #: [id number] Date of Birth: [birth year] Admit Type: Outpatient Age: 59 Room: [facility] ENDO ROOM 4 Gender: Male Note Status: Finalized Procedure:         Upper GI endoscopy Indications:       For therapy of Barrett's esophagus Providers:         Lupita Dawn. Candace Cruise, MD Referring MD:      Youlanda Roys. Lovie Macadamia, MD (Referring MD) Medicines:         Monitored Anesthesia Care Complications:     No immediate complications. Procedure:         Pre-Anesthesia Assessment:                    - Prior to the procedure, a History and Physical was                     performed, and patient medications, allergies and                     sensitivities were reviewed. The patient's tolerance of                     previous anesthesia was reviewed.                    - The risks and benefits of the procedure and the sedation                     options and risks were discussed with the patient. All                     questions were answered and informed consent was obtained.                    - After reviewing the risks and benefits, the patient was                     deemed in satisfactory condition to undergo the procedure.                    After obtaining informed consent, the endoscope was passed                     under direct vision. Throughout the procedure, the                     patient's blood pressure, pulse, and oxygen saturations                     were monitored continuously. The Endoscope was introduced                     through the mouth, and advanced to the second part of                     duodenum. The upper GI endoscopy was accomplished without                     difficulty. The patient tolerated the procedure well. Findings:      The esophagus and gastroesophageal junction were examined with white       light and narrow band imaging (NBI). There were  esophageal mucosal  changes consistent with long-segment Barrett's esophagus, extending from       the upper extent of the gastric folds which were at 40 cm from the       incisors to the Z-line which was at 35 cm from the incisors.       Circumferential salmon-colored mucosa was present from 36 to 40 cm. The       maximum longitudinal extent of these esophageal mucosal changes was 5 cm       in length. Circumferential radiofrequency ablation of Barrett's       esophagus was performed, using the Halo 360 Express catheter and       balloon-based endoscopic ablation system. With the endoscope in place,       the position and extent of the Barrett's mucosa and the anatomic       landmarks including proximal and distal extent of Barrett's mucosa were       noted. The Barrett's mucosa was irrigated with N-acetylcysteine       (Mucomyst) 1% mixed with water. Esophageal contents were suctioned. A       guidewire was passed down the biopsy channel of the endoscope and the       endoscope was then withdrawn from the mouth leaving the guidewire in       place. The shaft of the balloon catheter was lubricated with water and       passed transorally over the guidewire into the esophagus and positioned       36 cm from the incisors. The balloon was automatically inflated with the       energy generator and the inner diameter measurement of the esophagus was       obtained. The sizing balloon catheter was withdrawn. 360 Express       radiofrequency ablation balloon catheter was then selected and passed       transorally over the guidewire into the esophagus. The endoscope was       introduced in a side-by-side manner with the ablation catheter. The       balloon ablation catheter was positioned under direct visualization so       that the proximal edge of the electrode was at 36 cm from the incisors.       The balloon was automatically inflated and energy was applied at 10       J/cm2. The ablation  catheter and guidewire were removed and the balloon       was cleaned. The ablation zone was then cleaned of overlying coagulative       debris using irrigation and suction via the endoscope and a cleaning       cap. The guidewire was reinserted and then the ablation catheter was       reintroduced into the esophagus over the wire. The ablation catheter was       positioned under direct visualization so that the proximal edge of the       electrode was at the proximal edge of the ablation zone. Reinflation and       a second round of ablation was performed to re-treat the Barrett's       epithelium already treated with the first round of ablation. The       ablation catheter and guidewire were then removed. The areas of the       esophagus where Barrett's mucosa had been ablated were then carefully       examined with  the endoscope. Areas of Barrett's esophagus were partially       treated. Few island at 35cm seen on NBI was not treated today. Will be       treated next time wit focal catheters.      The entire examined stomach was normal.      The examined duodenum was normal. Impression:        - Esophageal mucosal changes consistent with long-segment                     Barrett's esophagus. Treated with radiofrequency ablation.                    - Normal stomach.                    - Normal examined duodenum.                    - No specimens collected. Recommendation:    - Discharge patient to home.                    - Observe patient's clinical course.                    - Repeat the upper endoscopy in 2 months for retreatment.                    - The findings and recommendations were discussed with the                     patient.                    - Soft diet.                    - Treat pain with GI cocktail and tylenol with codeine                     elixir. Procedure Code(s): --- Professional ---                    [phone], Esophagogastroduodenoscopy, flexible, transoral;                      with ablation of tumor(s), polyp(s), or other lesion(s)                     (includes pre- and post-dilation and guide wire passage,                     when performed) Diagnosis Code(s): --- Professional ---                    K22.70, Barrett's esophagus without dysplasia CPT copyright 2014 [employer] Association. All rights reserved. The codes documented in this report are preliminary and upon coder review may  be revised to meet current compliance requirements. Hulen Luster, MD 03/15/2015 11:10:42 AM This report has been signed electronically. Number of Addenda: 0 Note Initiated On: 03/15/2015 10:30 AM      [facility]

## 2015-03-16 ENCOUNTER — Encounter: Payer: Self-pay | Admitting: Gastroenterology

## 2015-04-06 ENCOUNTER — Other Ambulatory Visit: Payer: Self-pay | Admitting: Unknown Physician Specialty

## 2015-04-06 DIAGNOSIS — M1712 Unilateral primary osteoarthritis, left knee: Secondary | ICD-10-CM

## 2015-04-11 ENCOUNTER — Ambulatory Visit
Admission: RE | Admit: 2015-04-11 | Discharge: 2015-04-11 | Disposition: A | Discharge status: Home / Self Care | Payer: BLUE CROSS/BLUE SHIELD | Source: Ambulatory Visit | Attending: Unknown Physician Specialty | Admitting: Unknown Physician Specialty

## 2015-04-11 DIAGNOSIS — M13862 Other specified arthritis, left knee: Secondary | ICD-10-CM | POA: Insufficient documentation

## 2015-04-11 DIAGNOSIS — X58XXXA Exposure to other specified factors, initial encounter: Secondary | ICD-10-CM | POA: Insufficient documentation

## 2015-04-11 DIAGNOSIS — M1712 Unilateral primary osteoarthritis, left knee: Secondary | ICD-10-CM

## 2015-04-11 DIAGNOSIS — S83242A Other tear of medial meniscus, current injury, left knee, initial encounter: Secondary | ICD-10-CM | POA: Diagnosis not present

## 2015-04-11 DIAGNOSIS — M25562 Pain in left knee: Secondary | ICD-10-CM | POA: Diagnosis present

## 2015-05-08 ENCOUNTER — Other Ambulatory Visit: Payer: Self-pay | Admitting: Unknown Physician Specialty

## 2015-05-08 DIAGNOSIS — M1712 Unilateral primary osteoarthritis, left knee: Secondary | ICD-10-CM

## 2015-05-11 ENCOUNTER — Ambulatory Visit
Admission: RE | Admit: 2015-05-11 | Discharge: 2015-05-11 | Disposition: A | Discharge status: Home / Self Care | Payer: BLUE CROSS/BLUE SHIELD | Source: Ambulatory Visit | Attending: Unknown Physician Specialty | Admitting: Unknown Physician Specialty

## 2015-05-11 DIAGNOSIS — I70202 Unspecified atherosclerosis of native arteries of extremities, left leg: Secondary | ICD-10-CM | POA: Diagnosis not present

## 2015-05-11 DIAGNOSIS — M1712 Unilateral primary osteoarthritis, left knee: Secondary | ICD-10-CM | POA: Diagnosis not present

## 2015-06-21 ENCOUNTER — Encounter: Admission: RE | Payer: Self-pay | Source: Ambulatory Visit

## 2015-06-21 ENCOUNTER — Ambulatory Visit
Admission: RE | Admit: 2015-06-21 | Payer: BLUE CROSS/BLUE SHIELD | Source: Ambulatory Visit | Admitting: Gastroenterology

## 2015-06-21 SURGERY — ESOPHAGOGASTRODUODENOSCOPY (EGD) WITH PROPOFOL
Anesthesia: General

## 2015-07-03 DIAGNOSIS — Z96652 Presence of left artificial knee joint: Secondary | ICD-10-CM | POA: Insufficient documentation

## 2015-07-25 ENCOUNTER — Other Ambulatory Visit: Payer: Self-pay | Admitting: Unknown Physician Specialty

## 2015-07-25 DIAGNOSIS — M5412 Radiculopathy, cervical region: Secondary | ICD-10-CM

## 2015-07-26 ENCOUNTER — Ambulatory Visit: Payer: BLUE CROSS/BLUE SHIELD

## 2015-07-27 ENCOUNTER — Encounter: Payer: Self-pay | Admitting: *Deleted

## 2015-07-27 ENCOUNTER — Emergency Department
Admission: EM | Admit: 2015-07-27 | Discharge: 2015-07-27 | Disposition: A | Discharge status: Home / Self Care | Payer: BLUE CROSS/BLUE SHIELD | Attending: Student | Admitting: Student

## 2015-07-27 DIAGNOSIS — Z79899 Other long term (current) drug therapy: Secondary | ICD-10-CM | POA: Diagnosis not present

## 2015-07-27 DIAGNOSIS — I10 Essential (primary) hypertension: Secondary | ICD-10-CM | POA: Diagnosis not present

## 2015-07-27 DIAGNOSIS — Z720 Tobacco use: Secondary | ICD-10-CM | POA: Insufficient documentation

## 2015-07-27 DIAGNOSIS — M25511 Pain in right shoulder: Secondary | ICD-10-CM | POA: Diagnosis present

## 2015-07-27 DIAGNOSIS — M5412 Radiculopathy, cervical region: Secondary | ICD-10-CM | POA: Diagnosis not present

## 2015-07-27 MED ORDER — HYDROCODONE-ACETAMINOPHEN 5-325 MG PO TABS: 1.0000 | ORAL_TABLET | ORAL | Status: DC | PRN

## 2015-07-27 MED ORDER — HYDROCODONE-ACETAMINOPHEN 5-325 MG PO TABS
1.0000 | ORAL_TABLET | ORAL | Status: AC
  Administered 2015-07-27: 1 via ORAL
  Filled 2015-07-27: qty 1

## 2015-07-27 MED ORDER — PREDNISONE 10 MG PO TABS: 10.0000 mg | ORAL_TABLET | Freq: Every day | ORAL | Status: DC

## 2015-07-27 MED ORDER — DIAZEPAM 5 MG PO TABS
5.0000 mg | ORAL_TABLET | Freq: Once | ORAL | Status: AC
  Administered 2015-07-27: 5 mg via ORAL
  Filled 2015-07-27: qty 1

## 2015-07-27 MED ORDER — DIAZEPAM 5 MG PO TABS: 5.0000 mg | ORAL_TABLET | Freq: Three times a day (TID) | ORAL | Status: DC | PRN

## 2015-07-27 MED ORDER — PREDNISONE 20 MG PO TABS
60.0000 mg | ORAL_TABLET | Freq: Once | ORAL | Status: AC
  Administered 2015-07-27: 60 mg via ORAL
  Filled 2015-07-27: qty 3

## 2015-07-27 NOTE — ED Notes (Addendum)
Pt was seen in the walk in clinic 2 days ago for painful shoulder, given Toradol and Flexeril, pt reports no relief, pt reports pain in shoulder blade and arm, pt denies chest pain or any other symptoms

## 2015-07-27 NOTE — Discharge Instructions (Signed)

## 2015-07-27 NOTE — ED Provider Notes (Signed)
CSN: [id number]     Arrival date & time 07/27/15  1814 History   First MD Initiated Contact with Patient 07/27/15 1857     Chief Complaint  Patient presents with  . Shoulder Pain     (Consider location/radiation/quality/duration/timing/severity/associated sxs/prior Treatment) HPI  59 year old male presents to the emergency department for evaluation of right shoulder and arm pain. He was seen at the walk-in clinic 2 days ago and diagnosed with cervical radiculopathy. Symptoms have been present for 3 days. X-rays of the cervical spine showed no evidence of acute bony abnormality. He was treated with Flexeril and injection of Toradol. He has had no improvement. Patient describes a dull ache in his right superior scapular border, right arm and into the forearm. Pain is increased with cervical range of motion. 7 out of 10. He is currently taking Goody's powders with no improvement. No fevers.  Past Medical History  Diagnosis Date  . Barrett's esophagus   . COPD (chronic obstructive pulmonary disease) ([place])   . Hypertension   . GERD (gastroesophageal reflux disease)   . Osteoarthritis   . Neuropathy ([place])   . Hyperlipidemia   . BPH (benign prostatic hyperplasia)   . Hemorrhoids   . Asthma    Past Surgical History  Procedure Laterality Date  . Esophagogastroduodenoscopy N/A 03/15/2015    Procedure: ESOPHAGOGASTRODUODENOSCOPY (EGD);  Surgeon: Hulen Luster, MD;  Location: [facility] ENDOSCOPY;  Service: Gastroenterology;  Laterality: N/A;  . Joint replacement     No family history on file. Social History  Substance Use Topics  . Smoking status: Current Every Day Smoker -- 2.00 packs/day  . Smokeless tobacco: None  . Alcohol Use: No    Review of Systems  Constitutional: Negative.  Negative for fever, chills, activity change and appetite change.  HENT: Negative for congestion, ear pain, mouth sores, rhinorrhea, sinus pressure, sore throat and trouble swallowing.   Eyes: Negative for  photophobia, pain and discharge.  Respiratory: Negative for cough, chest tightness and shortness of breath.   Cardiovascular: Negative for chest pain and leg swelling.  Gastrointestinal: Negative for nausea, vomiting, abdominal pain, diarrhea and abdominal distention.  Genitourinary: Negative for dysuria and difficulty urinating.  Musculoskeletal: Positive for neck pain (Right upper extremity cervical radicular symptoms.). Negative for back pain, arthralgias, gait problem and neck stiffness.  Skin: Negative for color change and rash.  Neurological: Negative for dizziness and headaches.  Hematological: Negative for adenopathy.  Psychiatric/Behavioral: Negative for behavioral problems and agitation.      Allergies  Codeine and Zyban  Home Medications   Prior to Admission medications   Medication Sig Start Date End Date Taking? Authorizing Provider  ADVAIR DISKUS 250-50 MCG/DOSE AEPB  01/02/15   Historical Provider, MD  diazepam (VALIUM) 5 MG tablet Take 1 tablet (5 mg total) by mouth every 8 (eight) hours as needed for muscle spasms. 07/27/15   Duanne Guess, PA-C  diclofenac (VOLTAREN) 75 MG EC tablet Take 75 mg by mouth 2 (two) times daily. 01/05/15   Historical Provider, MD  esomeprazole ([place]) 40 MG capsule  02/23/15   Historical Provider, MD  fluticasone Asencion Islam) 50 MCG/ACT nasal spray  01/02/15   Historical Provider, MD  gemfibrozil (LOPID) 600 MG tablet  02/01/15   Historical Provider, MD  HYDROcodone-acetaminophen (NORCO) 5-325 MG tablet Take 1-2 tablets by mouth every 4 (four) hours as needed for moderate pain. 07/27/15   Duanne Guess, PA-C  losartan (COZAAR) 50 MG tablet  01/28/15   Historical Provider, MD  LYRICA 150 MG capsule  01/02/15   Historical Provider, MD  predniSONE (DELTASONE) 10 MG tablet Take 1 tablet (10 mg total) by mouth daily. 6,5,4,3,2,1 six day taper 07/27/15   Duanne Guess, PA-C  PROAIR HFA 108 (90 BASE) MCG/ACT inhaler Inhale 2 puffs into the lungs every  6 (six) hours as needed. 12/21/14   Historical Provider, MD  SPIRIVA HANDIHALER 18 MCG inhalation capsule  01/02/15   Historical Provider, MD  zolpidem (AMBIEN) 10 MG tablet Take 10 mg by mouth at bedtime. 02/08/15   Historical Provider, MD   BP 145/98 mmHg  Pulse 95  Temp(Src) 98.1 F (36.7 C) (Oral)  Ht 5\' 10"  (1.778 m)  Wt 195 lb (88.451 kg)  BMI 27.98 kg/m2  SpO2 97% Physical Exam  Constitutional: He is oriented to person, place, and time. He appears well-developed and well-nourished.  HENT:  Head: Normocephalic and atraumatic.  Eyes: Conjunctivae and EOM are normal. Pupils are equal, round, and reactive to light.  Neck: Normal range of motion. Neck supple.  Cardiovascular: Normal rate and intact distal pulses.   Pulmonary/Chest: Effort normal. No respiratory distress.  Abdominal: Soft. Bowel sounds are normal. He exhibits no distension. There is no tenderness.  Musculoskeletal: Normal range of motion.  Examination of the cervical spine shows patient has full range of motion, increased discomfort in the right shoulder with cervical extension. He has a positive Spurling's test. Patient is nontender throughout the right shoulder. He has full active and passive range of motion of the right shoulder. He is tender over the right superior scapular border. He has 5 out of 5 strength with supraspinatus, biceps, triceps, grip strength in the right upper extremity. 2+ radial pulse sensation intact throughout.  Lymphadenopathy:    He has no cervical adenopathy.  Neurological: He is alert and oriented to person, place, and time.  Skin: Skin is warm and dry.  Psychiatric: He has a normal mood and affect. His behavior is normal. Judgment and thought content normal.  Nursing note and vitals reviewed.   ED Course  Procedures (including critical care time) Labs Review Labs Reviewed - No data to display  Imaging Review No results found. I have personally reviewed and evaluated these images and  lab results as part of my medical decision-making.   EKG Interpretation None      MDM   Final diagnoses:  Right cervical radiculopathy    59 year old male with a 5 day history of right cervical radiculopathy. Patient with no improvement with Toradol/Flexeril. No neurological deficits on exam. X-rays 2 days ago negative. Patient is treated with 6 day prednisone taper, Norco 5-3 25, one to 2 tabs by mouth every 4 hours when necessary pain quantity #30 was 0 refills, Valium 5 mg 1 tab by mouth every 8 hours when necessary muscle spasms. Patient will follow-up with orthopedics or neurology in 5-7 days if no improvement, would recommend MRI of the cervical spine.    Duanne Guess, PA-C 07/27/15 1926  Joanne Gavel, MD 07/28/15 [phone]

## 2015-08-23 ENCOUNTER — Ambulatory Visit: Payer: BLUE CROSS/BLUE SHIELD | Admitting: Certified Registered Nurse Anesthetist

## 2015-08-23 ENCOUNTER — Encounter
Admission: RE | Disposition: A | Discharge status: Home / Self Care | Payer: Self-pay | Source: Ambulatory Visit | Attending: Gastroenterology

## 2015-08-23 ENCOUNTER — Ambulatory Visit
Admission: RE | Admit: 2015-08-23 | Discharge: 2015-08-23 | Disposition: A | Discharge status: Home / Self Care | Payer: BLUE CROSS/BLUE SHIELD | Source: Ambulatory Visit | Attending: Gastroenterology | Admitting: Gastroenterology

## 2015-08-23 ENCOUNTER — Encounter: Payer: Self-pay | Admitting: *Deleted

## 2015-08-23 DIAGNOSIS — Z8249 Family history of ischemic heart disease and other diseases of the circulatory system: Secondary | ICD-10-CM | POA: Diagnosis not present

## 2015-08-23 DIAGNOSIS — K219 Gastro-esophageal reflux disease without esophagitis: Secondary | ICD-10-CM | POA: Insufficient documentation

## 2015-08-23 DIAGNOSIS — Z79891 Long term (current) use of opiate analgesic: Secondary | ICD-10-CM | POA: Insufficient documentation

## 2015-08-23 DIAGNOSIS — M1991 Primary osteoarthritis, unspecified site: Secondary | ICD-10-CM | POA: Diagnosis not present

## 2015-08-23 DIAGNOSIS — Z820 Family history of epilepsy and other diseases of the nervous system: Secondary | ICD-10-CM | POA: Diagnosis not present

## 2015-08-23 DIAGNOSIS — F17210 Nicotine dependence, cigarettes, uncomplicated: Secondary | ICD-10-CM | POA: Diagnosis not present

## 2015-08-23 DIAGNOSIS — E785 Hyperlipidemia, unspecified: Secondary | ICD-10-CM | POA: Diagnosis not present

## 2015-08-23 DIAGNOSIS — J449 Chronic obstructive pulmonary disease, unspecified: Secondary | ICD-10-CM | POA: Insufficient documentation

## 2015-08-23 DIAGNOSIS — K2270 Barrett's esophagus without dysplasia: Secondary | ICD-10-CM | POA: Insufficient documentation

## 2015-08-23 DIAGNOSIS — I10 Essential (primary) hypertension: Secondary | ICD-10-CM | POA: Diagnosis not present

## 2015-08-23 DIAGNOSIS — Z9889 Other specified postprocedural states: Secondary | ICD-10-CM | POA: Insufficient documentation

## 2015-08-23 DIAGNOSIS — Z7951 Long term (current) use of inhaled steroids: Secondary | ICD-10-CM | POA: Insufficient documentation

## 2015-08-23 DIAGNOSIS — Z791 Long term (current) use of non-steroidal anti-inflammatories (NSAID): Secondary | ICD-10-CM | POA: Insufficient documentation

## 2015-08-23 DIAGNOSIS — Z8042 Family history of malignant neoplasm of prostate: Secondary | ICD-10-CM | POA: Insufficient documentation

## 2015-08-23 DIAGNOSIS — Z7952 Long term (current) use of systemic steroids: Secondary | ICD-10-CM | POA: Diagnosis not present

## 2015-08-23 DIAGNOSIS — Z808 Family history of malignant neoplasm of other organs or systems: Secondary | ICD-10-CM | POA: Diagnosis not present

## 2015-08-23 DIAGNOSIS — Z823 Family history of stroke: Secondary | ICD-10-CM | POA: Insufficient documentation

## 2015-08-23 DIAGNOSIS — Z96653 Presence of artificial knee joint, bilateral: Secondary | ICD-10-CM | POA: Insufficient documentation

## 2015-08-23 DIAGNOSIS — Z79899 Other long term (current) drug therapy: Secondary | ICD-10-CM | POA: Insufficient documentation

## 2015-08-23 DIAGNOSIS — Z833 Family history of diabetes mellitus: Secondary | ICD-10-CM | POA: Insufficient documentation

## 2015-08-23 DIAGNOSIS — Z8262 Family history of osteoporosis: Secondary | ICD-10-CM | POA: Insufficient documentation

## 2015-08-23 DIAGNOSIS — J45909 Unspecified asthma, uncomplicated: Secondary | ICD-10-CM | POA: Diagnosis not present

## 2015-08-23 DIAGNOSIS — Z885 Allergy status to narcotic agent status: Secondary | ICD-10-CM | POA: Insufficient documentation

## 2015-08-23 DIAGNOSIS — N400 Enlarged prostate without lower urinary tract symptoms: Secondary | ICD-10-CM | POA: Diagnosis not present

## 2015-08-23 DIAGNOSIS — E559 Vitamin D deficiency, unspecified: Secondary | ICD-10-CM | POA: Diagnosis not present

## 2015-08-23 DIAGNOSIS — Z888 Allergy status to other drugs, medicaments and biological substances status: Secondary | ICD-10-CM | POA: Insufficient documentation

## 2015-08-23 DIAGNOSIS — G629 Polyneuropathy, unspecified: Secondary | ICD-10-CM | POA: Insufficient documentation

## 2015-08-23 HISTORY — PX: ESOPHAGOGASTRODUODENOSCOPY (EGD) WITH PROPOFOL: SHX5813

## 2015-08-23 SURGERY — ESOPHAGOGASTRODUODENOSCOPY (EGD) WITH PROPOFOL
Anesthesia: General

## 2015-08-23 MED ORDER — STERILE WATER FOR INJECTION IJ SOLN
Freq: Once | RESPIRATORY_TRACT | Status: AC
  Administered 2015-08-23: 09:00:00 via OROMUCOSAL
  Filled 2015-08-23: qty 3

## 2015-08-23 MED ORDER — GLYCOPYRROLATE 0.2 MG/ML IJ SOLN
INTRAMUSCULAR | Status: DC | PRN
  Administered 2015-08-23: .1 mg via INTRAVENOUS

## 2015-08-23 MED ORDER — PROPOFOL 10 MG/ML IV BOLUS
INTRAVENOUS | Status: DC | PRN
  Administered 2015-08-23: 70 mg via INTRAVENOUS

## 2015-08-23 MED ORDER — SODIUM CHLORIDE 0.9 % IV SOLN: INTRAVENOUS | Status: DC

## 2015-08-23 MED ORDER — FENTANYL CITRATE (PF) 100 MCG/2ML IJ SOLN
INTRAMUSCULAR | Status: DC | PRN
  Administered 2015-08-23: 50 ug via INTRAVENOUS

## 2015-08-23 MED ORDER — MIDAZOLAM HCL 5 MG/5ML IJ SOLN
INTRAMUSCULAR | Status: DC | PRN
  Administered 2015-08-23: 1 mg via INTRAVENOUS

## 2015-08-23 MED ORDER — PROPOFOL 500 MG/50ML IV EMUL
INTRAVENOUS | Status: DC | PRN
  Administered 2015-08-23: 140 ug/kg/min via INTRAVENOUS

## 2015-08-23 MED ORDER — SODIUM CHLORIDE 0.9 % IV SOLN
INTRAVENOUS | Status: DC
  Administered 2015-08-23: 1000 mL via INTRAVENOUS

## 2015-08-23 NOTE — Op Note (Signed)
[facility] Gastroenterology Patient Name: Anthony Bender Procedure Date: 08/23/2015 8:02 AM MRN: [id number] Account #: [id number] Date of Birth: 24-Mar-1956 Admit Type: Outpatient Age: 59 Room: [facility] ENDO ROOM 4 Gender: Male Note Status: Finalized Procedure:         Upper GI endoscopy Indications:       For therapy of Barrett's esophagus, S/P Barrx x 1. Providers:         Lupita Dawn. Candace Cruise, MD Referring MD:      Philis Pique. Vickki Muff, MD (Referring MD) Medicines:         Monitored Anesthesia Care Complications:     No immediate complications. Procedure:         Pre-Anesthesia Assessment:                    - Prior to the procedure, a History and Physical was                     performed, and patient medications, allergies and                     sensitivities were reviewed. The patient's tolerance of                     previous anesthesia was reviewed.                    - The risks and benefits of the procedure and the sedation                     options and risks were discussed with the patient. All                     questions were answered and informed consent was obtained.                    - After reviewing the risks and benefits, the patient was                     deemed in satisfactory condition to undergo the procedure.                    After obtaining informed consent, the endoscope was passed                     under direct vision. Throughout the procedure, the                     patient's blood pressure, pulse, and oxygen saturations                     were monitored continuously. The Endoscope was introduced                     through the mouth, and advanced to the second part of                     duodenum. The upper GI endoscopy was accomplished without                     difficulty. The patient tolerated the procedure well. Findings:      The esophagus and gastroesophageal junction were examined with white       light and narrow band imaging (NBI).  There were esophageal mucosal  changes consistent with long-segment Barrett's esophagus. These changes       involved the mucosa at the upper extent of the gastric folds (40 cm from       the incisors) extending to the Z-line (36 cm from the incisors).       Salmon-colored mucosa was present. The maximum longitudinal extent of       these esophageal mucosal changes was 4 cm in length. Focal       radiofrequency ablation of Barrett's esophagus was performed. With the       endoscope in place, the position and extent of the Barrett's mucosa and       the anatomic landmarks including proximal and distal extent of Barrett's       mucosa were noted. The Barrett's mucosa was irrigated with       N-acetylcysteine (Mucomyst) 1% mixed with water. Esophageal contents       were suctioned. The endoscope was then removed from the patient. The       Barrx-90 Ultra radiofrequency ablation catheter was attached to the tip       of the endoscope. The endoscope with the attached radiofrequency       ablation catheter was then passed transorally under direct vision into       the esophagus and advanced to the areas of Barrett's mucosa. The areas       included circumferential areas of Barrett's mucosa. The radiofrequency       ablation catheter was placed in contact with the surface of the       Barrett's mucosa under direct visualization and energy was applied once       at 12 J/cm2. Ablation was repeated in a likewise fashion to the entire       area of suspected Barrett's mucosa. The ablation zone was cleaned of       coagulative debris. The ablation catheter and endoscope were then       removed and the catheter was cleaned. The catheter and endoscope were       reinserted into the esophagus. A second round of ablation was then       performed. Energy was applied once at 12 J/cm2 to retreat the areas of       Barrett's epithelium that had been treated with the first series of       ablation. The  areas of the esophagus where Barrett's mucosa had been       ablated were carefully examined. Areas of Barrett's esophagus were       completely treated. Total of 12 treatments were given.      The entire examined stomach was normal.      The examined duodenum was normal. Impression:        - Esophageal mucosal changes consistent with long-segment                     Barrett's esophagus. Treated with radiofrequency ablation.                    - Normal stomach.                    - Normal examined duodenum.                    - No specimens collected. Recommendation:    - Discharge patient to home.                    -  Observe patient's clinical course.                    - The findings and recommendations were discussed with the                     patient.                    - Pain meds given. Repeat Barrx in 2-3 months. Procedure Code(s): --- Professional ---                    [phone], Esophagogastroduodenoscopy, flexible, transoral;                     with ablation of tumor(s), polyp(s), or other lesion(s)                     (includes pre- and post-dilation and guide wire passage,                     when performed) Diagnosis Code(s): --- Professional ---                    K22.70, Barrett's esophagus without dysplasia CPT copyright 2014 American Medical Association. All rights reserved. The codes documented in this report are preliminary and upon coder review may  be revised to meet current compliance requirements. Hulen Luster, MD 08/23/2015 8:49:51 AM This report has been signed electronically. Number of Addenda: 0 Note Initiated On: 08/23/2015 8:02 AM      [facility]

## 2015-08-23 NOTE — H&P (Signed)
Primary Care Physician:  Juluis Pitch, MD Primary Gastroenterologist:  Dr. Candace Cruise  Pre-Procedure History & Physical: HPI:  Anthony Bender is a 59 y.o. male is here for an EGD with Barrx.  Past Medical History  Diagnosis Date  . Barrett's esophagus   . COPD (chronic obstructive pulmonary disease) ([place])   . Hypertension   . GERD (gastroesophageal reflux disease)   . Osteoarthritis   . Neuropathy ([place])   . Hyperlipidemia   . BPH (benign prostatic hyperplasia)   . Hemorrhoids   . Asthma     Past Surgical History  Procedure Laterality Date  . Esophagogastroduodenoscopy N/A 03/15/2015    Procedure: ESOPHAGOGASTRODUODENOSCOPY (EGD);  Surgeon: Hulen Luster, MD;  Location: [facility] ENDOSCOPY;  Service: Gastroenterology;  Laterality: N/A;  . Joint replacement      Prior to Admission medications   Medication Sig Start Date End Date Taking? Authorizing Provider  ADVAIR DISKUS 250-50 MCG/DOSE AEPB  01/02/15  Yes Historical Provider, MD  diazepam (VALIUM) 5 MG tablet Take 1 tablet (5 mg total) by mouth every 8 (eight) hours as needed for muscle spasms. 07/27/15  Yes Duanne Guess, PA-C  diclofenac (VOLTAREN) 75 MG EC tablet Take 75 mg by mouth 2 (two) times daily. 01/05/15  Yes Historical Provider, MD  esomeprazole ([place]) 40 MG capsule  02/23/15  Yes Historical Provider, MD  fluticasone (FLONASE) 50 MCG/ACT nasal spray  01/02/15  Yes Historical Provider, MD  gemfibrozil (LOPID) 600 MG tablet  02/01/15  Yes Historical Provider, MD  HYDROcodone-acetaminophen (NORCO) 5-325 MG tablet Take 1-2 tablets by mouth every 4 (four) hours as needed for moderate pain. 07/27/15  Yes Duanne Guess, PA-C  losartan (COZAAR) 50 MG tablet  01/28/15  Yes Historical Provider, MD  LYRICA 150 MG capsule  01/02/15  Yes Historical Provider, MD  PROAIR HFA 108 (90 BASE) MCG/ACT inhaler Inhale 2 puffs into the lungs every 6 (six) hours as needed. 12/21/14  Yes Historical Provider, MD  SPIRIVA HANDIHALER 18 MCG inhalation  capsule  01/02/15  Yes Historical Provider, MD  zolpidem (AMBIEN) 10 MG tablet Take 10 mg by mouth at bedtime. 02/08/15  Yes Historical Provider, MD  predniSONE (DELTASONE) 10 MG tablet Take 1 tablet (10 mg total) by mouth daily. 6,5,4,3,2,1 six day taper Patient not taking: Reported on 08/23/2015 07/27/15   Duanne Guess, PA-C    Allergies as of 07/26/2015 - Review Complete 03/15/2015  Allergen Reaction Noted  . Codeine  03/14/2015  . Zyban [bupropion]  03/14/2015    History reviewed. No pertinent family history.  Social History   Social History  . Marital Status: Married    Spouse Name: N/A  . Number of Children: N/A  . Years of Education: N/A   Occupational History  . Not on file.   Social History Main Topics  . Smoking status: Current Every Day Smoker -- 2.00 packs/day  . Smokeless tobacco: Not on file  . Alcohol Use: No  . Drug Use: Not on file  . Sexual Activity: Not on file   Other Topics Concern  . Not on file   Social History Narrative    Review of Systems: See HPI, otherwise negative ROS  Physical Exam: BP 119/81 mmHg  Pulse 78  Temp(Src) 98.4 F (36.9 C) (Oral)  Resp 18  Ht 5\' 10"  (1.778 m)  Wt 89.812 kg (198 lb)  BMI 28.41 kg/m2  SpO2 96% General:   Alert,  pleasant and cooperative in NAD Head:  Normocephalic  and atraumatic. Neck:  Supple; no masses or thyromegaly. Lungs:  Clear throughout to auscultation.    Heart:  Regular rate and rhythm. Abdomen:  Soft, nontender and nondistended. Normal bowel sounds, without guarding, and without rebound.   Neurologic:  Alert and  oriented x4;  grossly normal neurologically.  Impression/Plan: Anthony Bender is here for an EGD with Barrx to be performed for long segment Barrett's Risks, benefits, limitations, and alternatives regarding EGD with Barrx have been reviewed with the patient.  Questions have been answered.  All parties agreeable.   Elyssa Pendelton, Lupita Dawn, MD  08/23/2015, 8:00 AM

## 2015-08-23 NOTE — Anesthesia Preprocedure Evaluation (Signed)
Anesthesia Evaluation  Patient identified by MRN, date of birth, ID band Patient awake    Reviewed: Allergy & Precautions, H&P , NPO status , Patient's Chart, lab work & pertinent test results, reviewed documented beta blocker date and time   Airway Mallampati: II  TM Distance: >3 FB Neck ROM: full    Dental  (+) Edentulous Upper, Upper Dentures, Partial Lower, Missing, Poor Dentition   Pulmonary neg shortness of breath, asthma , neg sleep apnea, COPD, neg recent URI, Current Smoker,    Pulmonary exam normal breath sounds clear to auscultation       Cardiovascular Exercise Tolerance: Good hypertension, On Medications (-) angina(-) CAD, (-) Past MI, (-) Cardiac Stents and (-) CABG Normal cardiovascular exam(-) dysrhythmias (-) Valvular Problems/Murmurs Rhythm:regular Rate:Normal     Neuro/Psych negative neurological ROS  negative psych ROS   GI/Hepatic Neg liver ROS, GERD  ,Barrett's esophagus   Endo/Other  negative endocrine ROS  Renal/GU negative Renal ROS  negative genitourinary   Musculoskeletal   Abdominal   Peds  Hematology negative hematology ROS (+)   Anesthesia Other Findings Past Medical History:   Barrett's esophagus                                          COPD (chronic obstructive pulmonary disease) (*              Hypertension                                                 GERD (gastroesophageal reflux disease)                       Osteoarthritis                                               Neuropathy (HCC)                                             Hyperlipidemia                                               BPH (benign prostatic hyperplasia)                           Hemorrhoids                                                  Asthma  Reproductive/Obstetrics negative OB ROS                             Anesthesia  Physical Anesthesia Plan  ASA: III  Anesthesia Plan: General   Post-op Pain Management:    Induction:   Airway Management Planned:   Additional Equipment:   Intra-op Plan:   Post-operative Plan:   Informed Consent: I have reviewed the patients History and Physical, chart, labs and discussed the procedure including the risks, benefits and alternatives for the proposed anesthesia with the patient or authorized representative who has indicated his/her understanding and acceptance.   Dental Advisory Given  Plan Discussed with: Anesthesiologist, CRNA and Surgeon  Anesthesia Plan Comments:         Anesthesia Quick Evaluation

## 2015-08-23 NOTE — Anesthesia Procedure Notes (Signed)
Date/Time: 08/23/2015 8:20 AM Performed by: Kennon Holter Pre-anesthesia Checklist: Patient identified, Emergency Drugs available, Suction available, Patient being monitored and Timeout performed Patient Re-evaluated:Patient Re-evaluated prior to inductionOxygen Delivery Method: Nasal cannula Preoxygenation: Pre-oxygenation with 100% oxygen Intubation Type: IV induction

## 2015-08-23 NOTE — Transfer of Care (Signed)
Immediate Anesthesia Transfer of Care Note  Patient: Anthony Bender  Procedure(s) Performed: Procedure(s) with comments: ESOPHAGOGASTRODUODENOSCOPY (EGD) WITH PROPOFOL (N/A) -  Barrx Procedure  Patient Location: PACU and Endoscopy Unit  Anesthesia Type:General  Level of Consciousness: awake and sedated  Airway & Oxygen Therapy: Patient Spontanous Breathing and Patient connected to nasal cannula oxygen  Post-op Assessment: Report given to RN and Post -op Vital signs reviewed and stable  Post vital signs: Reviewed and stable  Last Vitals:  Filed Vitals:   08/23/15 0837  BP:   Pulse:   Temp: 37.2 C  Resp:     Complications: No apparent anesthesia complications

## 2015-08-23 NOTE — Anesthesia Postprocedure Evaluation (Signed)
  Anesthesia Post-op Note  Patient: Anthony Bender  Procedure(s) Performed: Procedure(s) with comments: ESOPHAGOGASTRODUODENOSCOPY (EGD) WITH PROPOFOL (N/A) -  Barrx Procedure  Anesthesia type:General  Patient location: PACU  Post pain: Pain level controlled  Post assessment: Post-op Vital signs reviewed, Patient's Cardiovascular Status Stable, Respiratory Function Stable, Patent Airway and No signs of Nausea or vomiting  Post vital signs: Reviewed and stable  Last Vitals:  Filed Vitals:   08/23/15 0907  BP: 132/82  Pulse: 76  Temp:   Resp: 13    Level of consciousness: awake, alert  and patient cooperative  Complications: No apparent anesthesia complications

## 2015-08-24 ENCOUNTER — Encounter: Payer: Self-pay | Admitting: Gastroenterology

## 2015-08-24 DIAGNOSIS — F5104 Psychophysiologic insomnia: Secondary | ICD-10-CM | POA: Insufficient documentation

## 2015-09-04 ENCOUNTER — Other Ambulatory Visit: Payer: Self-pay | Admitting: Unknown Physician Specialty

## 2015-09-04 DIAGNOSIS — M5412 Radiculopathy, cervical region: Secondary | ICD-10-CM

## 2015-09-25 ENCOUNTER — Ambulatory Visit
Admission: RE | Admit: 2015-09-25 | Discharge: 2015-09-25 | Disposition: A | Discharge status: Home / Self Care | Payer: BLUE CROSS/BLUE SHIELD | Source: Ambulatory Visit | Attending: Unknown Physician Specialty | Admitting: Unknown Physician Specialty

## 2015-09-25 DIAGNOSIS — M5412 Radiculopathy, cervical region: Secondary | ICD-10-CM

## 2015-10-12 DIAGNOSIS — Z87891 Personal history of nicotine dependence: Secondary | ICD-10-CM | POA: Insufficient documentation

## 2015-10-19 ENCOUNTER — Ambulatory Visit
Admission: RE | Admit: 2015-10-19 | Discharge: 2015-10-19 | Disposition: A | Discharge status: Home / Self Care | Payer: BLUE CROSS/BLUE SHIELD | Source: Ambulatory Visit | Attending: Unknown Physician Specialty | Admitting: Unknown Physician Specialty

## 2015-10-19 DIAGNOSIS — M5020 Other cervical disc displacement, unspecified cervical region: Secondary | ICD-10-CM | POA: Insufficient documentation

## 2015-10-19 DIAGNOSIS — M50321 Other cervical disc degeneration at C4-C5 level: Secondary | ICD-10-CM | POA: Insufficient documentation

## 2015-10-19 DIAGNOSIS — M5412 Radiculopathy, cervical region: Secondary | ICD-10-CM | POA: Diagnosis present

## 2015-12-18 DIAGNOSIS — M545 Low back pain, unspecified: Secondary | ICD-10-CM | POA: Insufficient documentation

## 2015-12-18 DIAGNOSIS — M5440 Lumbago with sciatica, unspecified side: Secondary | ICD-10-CM

## 2016-01-17 ENCOUNTER — Other Ambulatory Visit: Payer: Self-pay | Admitting: Orthopedic Surgery

## 2016-01-17 DIAGNOSIS — M5441 Lumbago with sciatica, right side: Secondary | ICD-10-CM

## 2016-01-17 DIAGNOSIS — M5136 Other intervertebral disc degeneration, lumbar region: Secondary | ICD-10-CM

## 2016-01-23 ENCOUNTER — Encounter: Payer: Self-pay | Admitting: *Deleted

## 2016-01-24 ENCOUNTER — Encounter: Payer: Self-pay | Admitting: *Deleted

## 2016-01-24 ENCOUNTER — Encounter
Admission: RE | Disposition: A | Discharge status: Home / Self Care | Payer: Self-pay | Source: Ambulatory Visit | Attending: Gastroenterology

## 2016-01-24 ENCOUNTER — Ambulatory Visit
Admission: RE | Admit: 2016-01-24 | Discharge: 2016-01-24 | Disposition: A | Discharge status: Home / Self Care | Payer: BLUE CROSS/BLUE SHIELD | Source: Ambulatory Visit | Attending: Gastroenterology | Admitting: Gastroenterology

## 2016-01-24 ENCOUNTER — Ambulatory Visit: Payer: BLUE CROSS/BLUE SHIELD | Admitting: Anesthesiology

## 2016-01-24 DIAGNOSIS — K219 Gastro-esophageal reflux disease without esophagitis: Secondary | ICD-10-CM | POA: Diagnosis not present

## 2016-01-24 DIAGNOSIS — M1990 Unspecified osteoarthritis, unspecified site: Secondary | ICD-10-CM | POA: Diagnosis not present

## 2016-01-24 DIAGNOSIS — F17210 Nicotine dependence, cigarettes, uncomplicated: Secondary | ICD-10-CM | POA: Insufficient documentation

## 2016-01-24 DIAGNOSIS — K2270 Barrett's esophagus without dysplasia: Secondary | ICD-10-CM | POA: Insufficient documentation

## 2016-01-24 DIAGNOSIS — Z7982 Long term (current) use of aspirin: Secondary | ICD-10-CM | POA: Insufficient documentation

## 2016-01-24 DIAGNOSIS — N400 Enlarged prostate without lower urinary tract symptoms: Secondary | ICD-10-CM | POA: Insufficient documentation

## 2016-01-24 DIAGNOSIS — J45909 Unspecified asthma, uncomplicated: Secondary | ICD-10-CM | POA: Insufficient documentation

## 2016-01-24 DIAGNOSIS — I10 Essential (primary) hypertension: Secondary | ICD-10-CM | POA: Diagnosis not present

## 2016-01-24 DIAGNOSIS — Z9049 Acquired absence of other specified parts of digestive tract: Secondary | ICD-10-CM | POA: Diagnosis not present

## 2016-01-24 DIAGNOSIS — Z79891 Long term (current) use of opiate analgesic: Secondary | ICD-10-CM | POA: Insufficient documentation

## 2016-01-24 DIAGNOSIS — Z9889 Other specified postprocedural states: Secondary | ICD-10-CM | POA: Insufficient documentation

## 2016-01-24 DIAGNOSIS — Z79899 Other long term (current) drug therapy: Secondary | ICD-10-CM | POA: Diagnosis not present

## 2016-01-24 DIAGNOSIS — Z7951 Long term (current) use of inhaled steroids: Secondary | ICD-10-CM | POA: Diagnosis not present

## 2016-01-24 DIAGNOSIS — K449 Diaphragmatic hernia without obstruction or gangrene: Secondary | ICD-10-CM | POA: Diagnosis not present

## 2016-01-24 DIAGNOSIS — Z888 Allergy status to other drugs, medicaments and biological substances status: Secondary | ICD-10-CM | POA: Diagnosis not present

## 2016-01-24 DIAGNOSIS — Z885 Allergy status to narcotic agent status: Secondary | ICD-10-CM | POA: Insufficient documentation

## 2016-01-24 DIAGNOSIS — J449 Chronic obstructive pulmonary disease, unspecified: Secondary | ICD-10-CM | POA: Insufficient documentation

## 2016-01-24 DIAGNOSIS — G629 Polyneuropathy, unspecified: Secondary | ICD-10-CM | POA: Insufficient documentation

## 2016-01-24 DIAGNOSIS — E785 Hyperlipidemia, unspecified: Secondary | ICD-10-CM | POA: Diagnosis not present

## 2016-01-24 DIAGNOSIS — Z791 Long term (current) use of non-steroidal anti-inflammatories (NSAID): Secondary | ICD-10-CM | POA: Diagnosis not present

## 2016-01-24 HISTORY — PX: ESOPHAGOGASTRODUODENOSCOPY (EGD) WITH PROPOFOL: SHX5813

## 2016-01-24 SURGERY — ESOPHAGOGASTRODUODENOSCOPY (EGD) WITH PROPOFOL
Anesthesia: General

## 2016-01-24 MED ORDER — PROPOFOL 500 MG/50ML IV EMUL
INTRAVENOUS | Status: DC | PRN
  Administered 2016-01-24: 120 ug/kg/min via INTRAVENOUS

## 2016-01-24 MED ORDER — STERILE WATER FOR INJECTION IJ SOLN
Freq: Once | RESPIRATORY_TRACT | Status: DC
  Filled 2016-01-24: qty 3

## 2016-01-24 MED ORDER — MIDAZOLAM HCL 2 MG/2ML IJ SOLN
INTRAMUSCULAR | Status: DC | PRN
  Administered 2016-01-24: 1 mg via INTRAVENOUS

## 2016-01-24 MED ORDER — SODIUM CHLORIDE 0.9 % IV SOLN
INTRAVENOUS | Status: DC
  Administered 2016-01-24: 09:00:00 via INTRAVENOUS

## 2016-01-24 MED ORDER — FENTANYL CITRATE (PF) 100 MCG/2ML IJ SOLN
INTRAMUSCULAR | Status: DC | PRN
  Administered 2016-01-24: 50 ug via INTRAVENOUS

## 2016-01-24 MED ORDER — PROPOFOL 10 MG/ML IV BOLUS
INTRAVENOUS | Status: DC | PRN
  Administered 2016-01-24: 90 mg via INTRAVENOUS

## 2016-01-24 MED ORDER — SODIUM CHLORIDE 0.9 % IV SOLN: INTRAVENOUS | Status: DC

## 2016-01-24 NOTE — H&P (Signed)
Primary Care Physician:  Chrisandra Carota, MD Primary Gastroenterologist:  Dr. Candace Cruise  Pre-Procedure History & Physical: HPI:  Anthony Bender is a 60 y.o. male is here for an EGD   Past Medical History  Diagnosis Date  . Barrett's esophagus   . COPD (chronic obstructive pulmonary disease) ([place])   . Hypertension   . GERD (gastroesophageal reflux disease)   . Osteoarthritis   . Neuropathy ([place])   . Hyperlipidemia   . BPH (benign prostatic hyperplasia)   . Hemorrhoids   . Asthma     Past Surgical History  Procedure Laterality Date  . Esophagogastroduodenoscopy N/A 03/15/2015    Procedure: ESOPHAGOGASTRODUODENOSCOPY (EGD);  Surgeon: Hulen Luster, MD;  Location: [facility] ENDOSCOPY;  Service: Gastroenterology;  Laterality: N/A;  . Joint replacement    . Esophagogastroduodenoscopy (egd) with propofol N/A 08/23/2015    Procedure: ESOPHAGOGASTRODUODENOSCOPY (EGD) WITH PROPOFOL;  Surgeon: Hulen Luster, MD;  Location: [facility] ENDOSCOPY;  Service: Gastroenterology;  Laterality: N/A;   Barrx Procedure  . Appendectomy    . Hernia repair    . Knee arthroscopy    . Reattachment of finger    . Superficial peroneal nerve release    . Back surgery      Prior to Admission medications   Medication Sig Start Date End Date Taking? Authorizing Provider  aspirin 325 MG tablet Take 325 mg by mouth daily.   Yes Historical Provider, MD  esomeprazole (NEXIUM) 40 MG capsule  02/23/15  Yes Historical Provider, MD  losartan (COZAAR) 50 MG tablet  01/28/15  Yes Historical Provider, MD  nicotine (NICOTROL) 10 MG inhaler Inhale 1 continuous puffing into the lungs as needed for smoking cessation.   Yes Historical Provider, MD  omega-3 acid ethyl esters (LOVAZA) 1 g capsule Take by mouth 2 (two) times daily.   Yes Historical Provider, MD  oxycodone (OXY-IR) 5 MG capsule Take 5 mg by mouth every 4 (four) hours as needed.   Yes Historical Provider, MD  PROAIR HFA 108 (90 BASE) MCG/ACT inhaler Inhale 2 puffs into the lungs  every 6 (six) hours as needed. 12/21/14  Yes Historical Provider, MD  varenicline (CHANTIX) 1 MG tablet Take 1 mg by mouth 2 (two) times daily.   Yes Historical Provider, MD  diazepam (VALIUM) 5 MG tablet Take 1 tablet (5 mg total) by mouth every 8 (eight) hours as needed for muscle spasms. 07/27/15   Duanne Guess, PA-C  diclofenac (VOLTAREN) 75 MG EC tablet Take 75 mg by mouth 2 (two) times daily. 01/05/15   Historical Provider, MD  fluticasone Asencion Islam) 50 MCG/ACT nasal spray  01/02/15   Historical Provider, MD  gemfibrozil (LOPID) 600 MG tablet  02/01/15   Historical Provider, MD  LYRICA 150 MG capsule  01/02/15   Historical Provider, MD  SPIRIVA HANDIHALER 18 MCG inhalation capsule  01/02/15   Historical Provider, MD  zolpidem (AMBIEN) 10 MG tablet Take 10 mg by mouth at bedtime. 02/08/15   Historical Provider, MD    Allergies as of 01/18/2016 - Review Complete 08/23/2015  Allergen Reaction Noted  . Codeine  03/14/2015  . Zyban [bupropion]  03/14/2015    History reviewed. No pertinent family history.  Social History   Social History  . Marital Status: Married    Spouse Name: N/A  . Number of Children: N/A  . Years of Education: N/A   Occupational History  . Not on file.   Social History Main Topics  . Smoking status: Current Every  Day Smoker -- 2.00 packs/day  . Smokeless tobacco: Not on file  . Alcohol Use: No  . Drug Use: No  . Sexual Activity: Not on file   Other Topics Concern  . Not on file   Social History Narrative    Review of Systems: See HPI, otherwise negative ROS  Physical Exam: BP 129/85 mmHg  Pulse 79  Temp(Src) 98.9 F (37.2 C) (Tympanic)  Resp 18  Ht 5\' 10"  (1.778 m)  Wt 89.812 kg (198 lb)  BMI 28.41 kg/m2  SpO2 100% General:   Alert,  pleasant and cooperative in NAD Head:  Normocephalic and atraumatic. Neck:  Supple; no masses or thyromegaly. Lungs:  Clear throughout to auscultation.    Heart:  Regular rate and rhythm. Abdomen:  Soft,  nontender and nondistended. Normal bowel sounds, without guarding, and without rebound.   Neurologic:  Alert and  oriented x4;  grossly normal neurologically.  Impression/Plan: Anthony Bender is here for an EGD to be performed for Barrett's esophagus  Risks, benefits, limitations, and alternatives regarding  EGD have been reviewed with the patient.  Questions have been answered.  All parties agreeable.   Alexandro Line, Lupita Dawn, MD  01/24/2016, 8:46 AM

## 2016-01-24 NOTE — Brief Op Note (Signed)
Ablation zone 37-40 cms. TIM 37, TGF 40. 8 pass1, Total 16.

## 2016-01-24 NOTE — Op Note (Signed)
[facility] Gastroenterology Patient Name: Anthony Bender Procedure Date: 01/24/2016 8:51 AM MRN: [id number] Account #: [id number] Date of Birth: [birth year] Admit Type: Outpatient Age: 60 Room: [facility] ENDO ROOM 4 Gender: Male Note Status: Finalized Procedure:            Upper GI endoscopy Indications:          For therapy of Barrett's esophagus, Long segment                        Barret's Providers:            Lupita Dawn. Candace Cruise, MD Referring MD:         Bo Mcclintock. Vickki Muff (Referring MD) Medicines:            s/p Barrx x 2. Monitored Anesthesia Care Complications:        No immediate complications. Procedure:            Pre-Anesthesia Assessment:                       - Prior to the procedure, a History and Physical was                        performed, and patient medications, allergies and                        sensitivities were reviewed. The patient's tolerance of                        previous anesthesia was reviewed.                       - The risks and benefits of the procedure and the                        sedation options and risks were discussed with the                        patient. All questions were answered and informed                        consent was obtained.                       - After reviewing the risks and benefits, the patient                        was deemed in satisfactory condition to undergo the                        procedure.                       After obtaining informed consent, the endoscope was                        passed under direct vision. Throughout the procedure,                        the patient's blood pressure, pulse, and oxygen  saturations were monitored continuously. The                        Colonoscope was introduced through the mouth, and                        advanced to the second part of duodenum. The upper GI                        endoscopy was accomplished without difficulty. The                     patient tolerated the procedure well. Findings:      The esophagus and gastroesophageal junction were examined with white       light and narrow band imaging (NBI) from a forward view and retroflexed       position. There were esophageal mucosal changes consistent with       short-segment Barrett's esophagus. These changes involved the mucosa at       the upper extent of the gastric folds (40 cm from the incisors)       extending to the Z-line (37 cm from the incisors). Tongues of       salmon-colored mucosa were present. The maximum longitudinal extent of       these esophageal mucosal changes was 3 cm in length. Focal       radiofrequency ablation of Barrett's esophagus was performed. With the       endoscope in place, the position and extent of the Barrett's mucosa and       the anatomic landmarks including proximal and distal extent of Barrett's       mucosa were noted. Endoscopic visualization identified an ablation site       including the entire visible Barrett's segment. The Barrett's mucosa was       irrigated with N-acetylcysteine (Mucomyst) 1% mixed with water.       Esophageal contents were suctioned. The endoscope was then removed from       the patient. The Barrx-90 Ultra radiofrequency ablation catheter was       attached to the tip of the endoscope. The endoscope with the attached       radiofrequency ablation catheter was then passed transorally under       direct vision into the esophagus and advanced to the areas of Barrett's       mucosa. The areas included tongues of Barrett's mucosa. The       radiofrequency ablation catheter was placed in contact with the surface       of the Barrett's mucosa under direct visualization and energy was       applied once at 12 J/cm2. Ablation was repeated in a likewise fashion to       the entire area of suspected Barrett's mucosa. The ablation zone was       cleaned of coagulative debris. The ablation catheter and  endoscope were       then removed and the catheter was cleaned. The catheter and endoscope       were reinserted into the esophagus. A second round of ablation was then       performed. Energy was applied once at 12 J/cm2 to retreat the areas of       Barrett's epithelium that had been treated with the first series of  ablation. The areas of the esophagus where Barrett's mucosa had been       ablated were examined. Total of 16 treatments were given.      The exam was otherwise without abnormality.      A medium-sized hiatal hernia was present.      The exam was otherwise without abnormality.      The examined duodenum was normal. Impression:           - Esophageal mucosal changes consistent with                        short-segment Barrett's esophagus. Treated with                        radiofrequency ablation.                       - The examination was otherwise normal.                       - Medium-sized hiatal hernia.                       - The examination was otherwise normal.                       - Normal examined duodenum.                       - No specimens collected. Recommendation:       - Discharge patient to home.                       - Observe patient's clinical course.                       - Continue present medications.                       - The findings and recommendations were discussed with                        the patient.                       - Pain meds prn. Procedure Code(s):    --- Professional ---                       [phone], Esophagogastroduodenoscopy, flexible, transoral;                        with ablation of tumor(s), polyp(s), or other lesion(s)                        (includes pre- and post-dilation and guide wire                        passage, when performed) Diagnosis Code(s):    --- Professional ---                       K22.70, Barrett's esophagus without dysplasia                       K44.9, Diaphragmatic hernia  without  obstruction or                        gangrene CPT copyright 2016 American Medical Association. All rights reserved. The codes documented in this report are preliminary and upon coder review may  be revised to meet current compliance requirements. Hulen Luster, MD 01/24/2016 9:12:07 AM This report has been signed electronically. Number of Addenda: 0 Note Initiated On: 01/24/2016 8:51 AM      [facility]

## 2016-01-24 NOTE — Anesthesia Preprocedure Evaluation (Signed)
Anesthesia Evaluation  Patient identified by MRN, date of birth, ID band Patient awake    Reviewed: Allergy & Precautions, NPO status , Patient's Chart, lab work & pertinent test results  Airway Mallampati: II       Dental  (+) Upper Dentures   Pulmonary COPD,  COPD inhaler, Current Smoker,    + rhonchi  + decreased breath sounds      Cardiovascular Exercise Tolerance: Good hypertension, Pt. on medications  Rhythm:Regular Rate:Normal     Neuro/Psych    GI/Hepatic Neg liver ROS, GERD  ,  Endo/Other    Renal/GU negative Renal ROS     Musculoskeletal   Abdominal Normal abdominal exam  (+)   Peds  Hematology negative hematology ROS (+)   Anesthesia Other Findings   Reproductive/Obstetrics                             Anesthesia Physical Anesthesia Plan  ASA: III  Anesthesia Plan: General   Post-op Pain Management:    Induction: Intravenous  Airway Management Planned: Natural Airway and Nasal Cannula  Additional Equipment:   Intra-op Plan:   Post-operative Plan:   Informed Consent: I have reviewed the patients History and Physical, chart, labs and discussed the procedure including the risks, benefits and alternatives for the proposed anesthesia with the patient or authorized representative who has indicated his/her understanding and acceptance.     Plan Discussed with: CRNA  Anesthesia Plan Comments:         Anesthesia Quick Evaluation

## 2016-01-24 NOTE — Transfer of Care (Signed)
Immediate Anesthesia Transfer of Care Note  Patient: Anthony Bender  Procedure(s) Performed: Procedure(s): ESOPHAGOGASTRODUODENOSCOPY (EGD) WITH PROPOFOL (N/A)  Patient Location: PACU and Endoscopy Unit  Anesthesia Type:General  Level of Consciousness: awake, alert  and oriented  Airway & Oxygen Therapy: Patient Spontanous Breathing and Patient connected to nasal cannula oxygen  Post-op Assessment: Report given to RN and Post -op Vital signs reviewed and stable  Post vital signs: Reviewed and stable  Last Vitals:  Filed Vitals:   01/24/16 0815 01/24/16 0915  BP: 129/85 137/86  Pulse: 79 80  Temp: 37.2 C   Resp: 18 22    Complications: No apparent anesthesia complications

## 2016-01-24 NOTE — Anesthesia Postprocedure Evaluation (Signed)
Anesthesia Post Note  Patient: Anthony Bender  Procedure(s) Performed: Procedure(s) (LRB): ESOPHAGOGASTRODUODENOSCOPY (EGD) WITH PROPOFOL (N/A)  Patient location during evaluation: PACU Anesthesia Type: General Level of consciousness: awake Pain management: pain level controlled Vital Signs Assessment: post-procedure vital signs reviewed and stable Respiratory status: spontaneous breathing Cardiovascular status: blood pressure returned to baseline Anesthetic complications: no    Last Vitals:  Filed Vitals:   01/24/16 0815 01/24/16 0915  BP: 129/85 137/86  Pulse: 79 80  Temp: 37.2 C 36.9 C  Resp: 18 22    Last Pain: There were no vitals filed for this visit.               VAN STAVEREN,Boyce Keltner

## 2016-01-27 ENCOUNTER — Encounter: Payer: Self-pay | Admitting: Gastroenterology

## 2016-02-06 ENCOUNTER — Ambulatory Visit
Admission: RE | Admit: 2016-02-06 | Discharge: 2016-02-06 | Disposition: A | Discharge status: Home / Self Care | Payer: BLUE CROSS/BLUE SHIELD | Source: Ambulatory Visit | Attending: Orthopedic Surgery | Admitting: Orthopedic Surgery

## 2016-02-06 DIAGNOSIS — M5441 Lumbago with sciatica, right side: Secondary | ICD-10-CM | POA: Diagnosis present

## 2016-02-06 DIAGNOSIS — M5136 Other intervertebral disc degeneration, lumbar region: Secondary | ICD-10-CM | POA: Insufficient documentation

## 2016-03-12 DIAGNOSIS — M25561 Pain in right knee: Secondary | ICD-10-CM | POA: Insufficient documentation

## 2016-03-12 DIAGNOSIS — M25562 Pain in left knee: Secondary | ICD-10-CM

## 2016-03-13 ENCOUNTER — Other Ambulatory Visit: Payer: Self-pay | Admitting: Unknown Physician Specialty

## 2016-03-13 DIAGNOSIS — M1711 Unilateral primary osteoarthritis, right knee: Secondary | ICD-10-CM

## 2016-03-18 DIAGNOSIS — M5030 Other cervical disc degeneration, unspecified cervical region: Secondary | ICD-10-CM | POA: Insufficient documentation

## 2016-03-18 DIAGNOSIS — I739 Peripheral vascular disease, unspecified: Secondary | ICD-10-CM | POA: Insufficient documentation

## 2016-06-07 IMAGING — CR DG CHEST 1V PORT
1 series · 1 of 1 positions shown · non-contrast
Comparison: 04/19/2012

CLINICAL DATA: Right upper quadrant pain

EXAM:
PORTABLE CHEST - 1 VIEW

[ap]
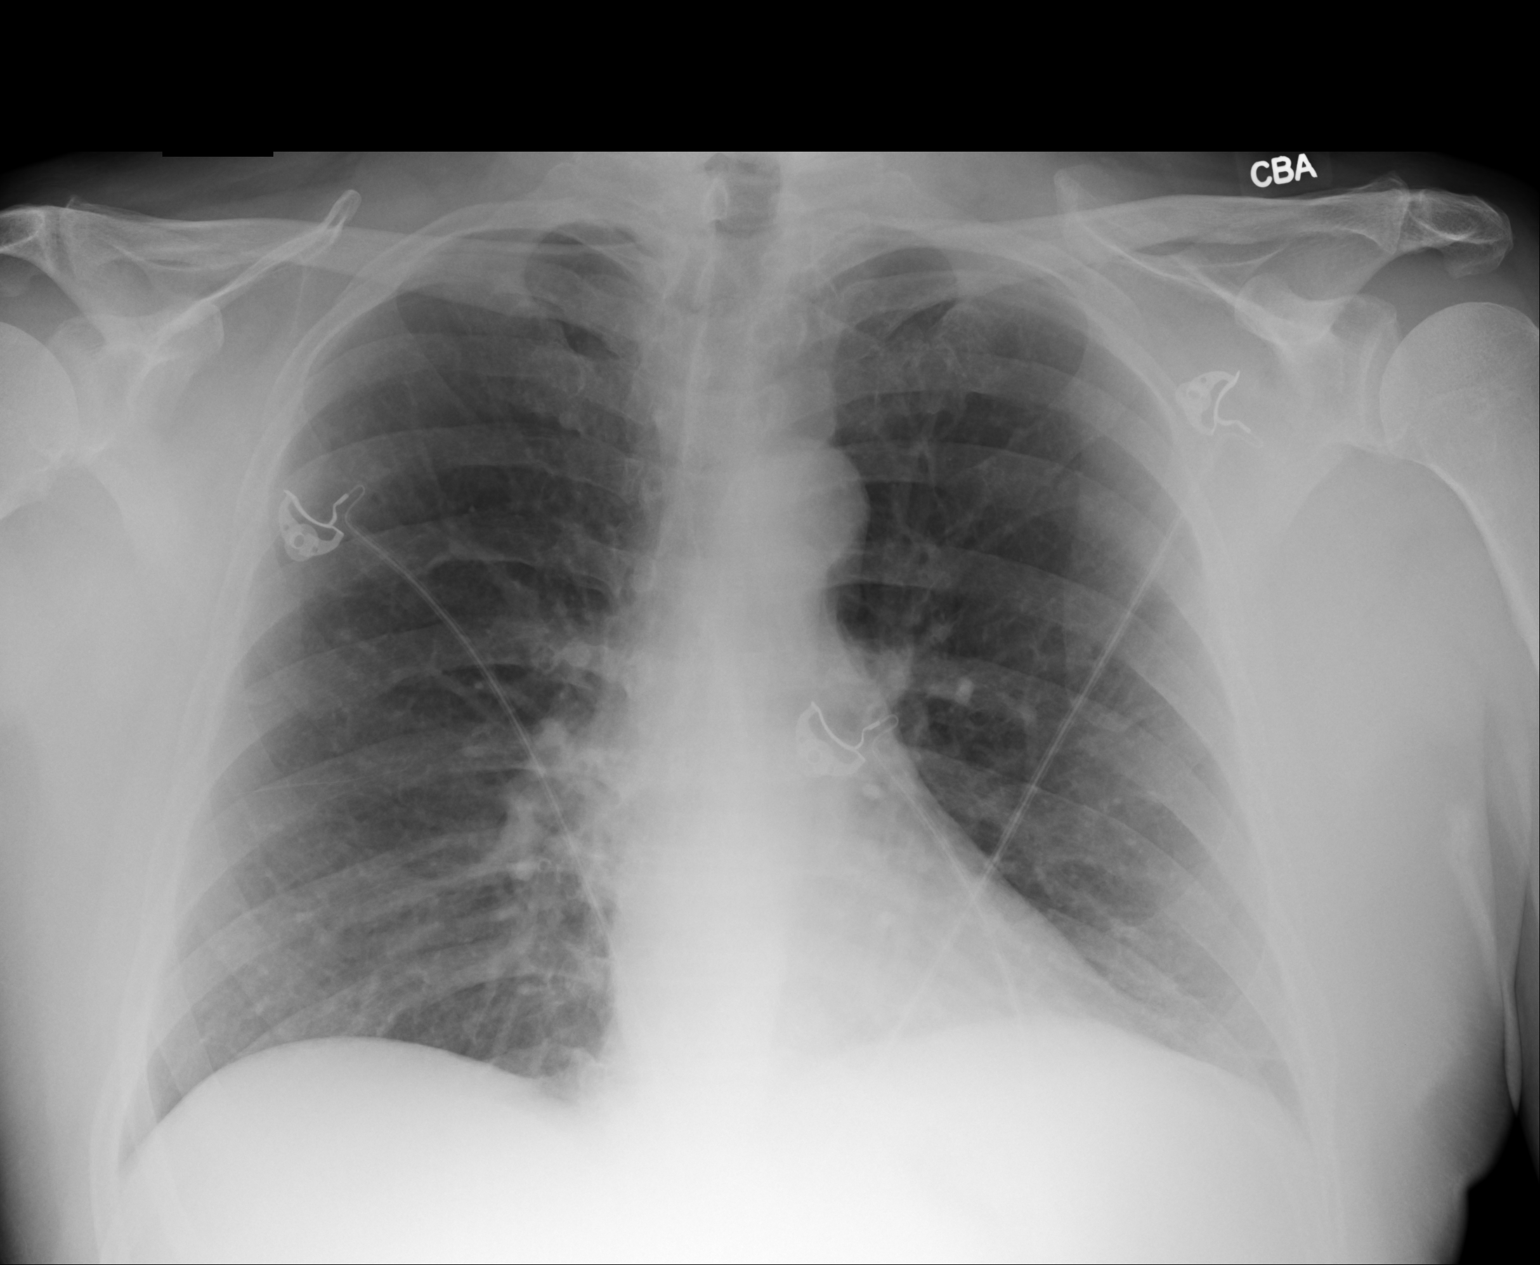

[1 of 1 positions shown; findings below may reference images not displayed]

FINDINGS: The heart size and mediastinal contours are within normal limits.
Both lungs are clear. The visualized skeletal structures are
unremarkable.
IMPRESSION: No active disease.

## 2016-10-30 ENCOUNTER — Encounter: Admission: RE | Payer: Self-pay | Source: Ambulatory Visit

## 2016-10-30 ENCOUNTER — Ambulatory Visit
Admission: RE | Admit: 2016-10-30 | Payer: BLUE CROSS/BLUE SHIELD | Source: Ambulatory Visit | Admitting: Gastroenterology

## 2016-10-30 SURGERY — COLONOSCOPY WITH PROPOFOL
Anesthesia: General

## 2017-05-01 ENCOUNTER — Other Ambulatory Visit: Payer: Self-pay | Admitting: Neurology

## 2017-05-01 DIAGNOSIS — R51 Headache: Secondary | ICD-10-CM

## 2017-05-01 DIAGNOSIS — R413 Other amnesia: Secondary | ICD-10-CM

## 2017-05-01 DIAGNOSIS — R519 Headache, unspecified: Secondary | ICD-10-CM

## 2017-05-19 ENCOUNTER — Ambulatory Visit
Admission: RE | Admit: 2017-05-19 | Discharge: 2017-05-19 | Disposition: A | Discharge status: Home / Self Care | Payer: BLUE CROSS/BLUE SHIELD | Source: Ambulatory Visit | Attending: Neurology | Admitting: Neurology

## 2017-05-19 DIAGNOSIS — R519 Headache, unspecified: Secondary | ICD-10-CM

## 2017-05-19 DIAGNOSIS — R413 Other amnesia: Secondary | ICD-10-CM | POA: Insufficient documentation

## 2017-05-19 DIAGNOSIS — R51 Headache: Secondary | ICD-10-CM | POA: Diagnosis present

## 2017-06-02 ENCOUNTER — Other Ambulatory Visit: Payer: Self-pay | Admitting: Unknown Physician Specialty

## 2017-06-02 DIAGNOSIS — K118 Other diseases of salivary glands: Secondary | ICD-10-CM

## 2017-06-09 ENCOUNTER — Other Ambulatory Visit: Payer: Self-pay | Admitting: Radiology

## 2017-06-10 ENCOUNTER — Ambulatory Visit
Admission: RE | Admit: 2017-06-10 | Discharge: 2017-06-10 | Disposition: A | Discharge status: Home / Self Care | Payer: BLUE CROSS/BLUE SHIELD | Source: Ambulatory Visit | Attending: Unknown Physician Specialty | Admitting: Unknown Physician Specialty

## 2017-06-10 ENCOUNTER — Other Ambulatory Visit: Payer: Self-pay | Admitting: Unknown Physician Specialty

## 2017-06-10 DIAGNOSIS — K119 Disease of salivary gland, unspecified: Secondary | ICD-10-CM | POA: Diagnosis not present

## 2017-06-10 DIAGNOSIS — K118 Other diseases of salivary glands: Secondary | ICD-10-CM

## 2017-06-10 LAB — CBC
HEMATOCRIT: 49.5 % (ref 40.0–52.0)
HEMOGLOBIN: 17.4 g/dL (ref 13.0–18.0)
MCH: 34 pg (ref 26.0–34.0)
MCHC: 35.1 g/dL (ref 32.0–36.0)
MCV: 96.8 fL (ref 80.0–100.0)
Platelets: 190 10*3/uL (ref 150–440)
RBC: 5.11 MIL/uL (ref 4.40–5.90)
RDW: 14 % (ref 11.5–14.5)
WBC: 9.4 10*3/uL (ref 3.8–10.6)

## 2017-06-10 LAB — PROTIME-INR
INR: 0.88
Prothrombin Time: 11.9 seconds (ref 11.4–15.2)

## 2017-06-10 LAB — APTT: APTT: 31 s (ref 24–36)

## 2017-06-10 MED ORDER — FENTANYL CITRATE (PF) 100 MCG/2ML IJ SOLN
INTRAMUSCULAR | Status: AC | PRN
  Administered 2017-06-10 (×2): 25 ug via INTRAVENOUS
  Administered 2017-06-10: 50 ug via INTRAVENOUS

## 2017-06-10 MED ORDER — FENTANYL CITRATE (PF) 100 MCG/2ML IJ SOLN
INTRAMUSCULAR | Status: AC
  Filled 2017-06-10: qty 2

## 2017-06-10 MED ORDER — MIDAZOLAM HCL 5 MG/5ML IJ SOLN
INTRAMUSCULAR | Status: AC | PRN
  Administered 2017-06-10 (×2): 1 mg via INTRAVENOUS
  Administered 2017-06-10: 0.5 mg via INTRAVENOUS
  Administered 2017-06-10: 1 mg via INTRAVENOUS

## 2017-06-10 MED ORDER — SODIUM CHLORIDE 0.9 % IV SOLN
INTRAVENOUS | Status: DC
  Administered 2017-06-10: 12:00:00 via INTRAVENOUS

## 2017-06-10 MED ORDER — MIDAZOLAM HCL 5 MG/5ML IJ SOLN
INTRAMUSCULAR | Status: AC
  Filled 2017-06-10: qty 5

## 2017-06-10 NOTE — Procedures (Signed)
  Procedure:   [place] L parotid mass FNA 25g x4 Preprocedure diagnosis:  Parotid mass Postprocedure diagnosis:  same EBL:     minimal Complications:   none immediate  See full dictation in [facility].  Dillard Cannon MD Main # [phone] Pager  [phone]

## 2017-06-11 ENCOUNTER — Other Ambulatory Visit: Payer: Self-pay | Admitting: Unknown Physician Specialty

## 2017-06-12 ENCOUNTER — Other Ambulatory Visit: Payer: Self-pay | Admitting: Unknown Physician Specialty

## 2017-06-12 LAB — CYTOLOGY - NON PAP

## 2017-06-16 ENCOUNTER — Other Ambulatory Visit: Payer: Self-pay | Admitting: Unknown Physician Specialty

## 2017-06-17 ENCOUNTER — Other Ambulatory Visit: Payer: Self-pay | Admitting: Unknown Physician Specialty

## 2017-06-17 DIAGNOSIS — K118 Other diseases of salivary glands: Secondary | ICD-10-CM

## 2017-06-23 ENCOUNTER — Encounter
Admission: RE | Admit: 2017-06-23 | Discharge: 2017-06-23 | Disposition: A | Discharge status: Home / Self Care | Payer: BLUE CROSS/BLUE SHIELD | Source: Ambulatory Visit | Attending: Unknown Physician Specialty | Admitting: Unknown Physician Specialty

## 2017-06-23 DIAGNOSIS — Z01810 Encounter for preprocedural cardiovascular examination: Secondary | ICD-10-CM | POA: Diagnosis present

## 2017-06-23 DIAGNOSIS — Z01812 Encounter for preprocedural laboratory examination: Secondary | ICD-10-CM | POA: Insufficient documentation

## 2017-06-23 DIAGNOSIS — I10 Essential (primary) hypertension: Secondary | ICD-10-CM | POA: Diagnosis not present

## 2017-06-23 HISTORY — DX: Other intervertebral disc degeneration, lumbar region without mention of lumbar back pain or lower extremity pain: M51.369

## 2017-06-23 HISTORY — DX: Anxiety disorder, unspecified: F41.9

## 2017-06-23 HISTORY — DX: Vitamin D deficiency, unspecified: E55.9

## 2017-06-23 HISTORY — DX: Headache, unspecified: R51.9

## 2017-06-23 HISTORY — DX: Tobacco use: Z72.0

## 2017-06-23 HISTORY — DX: Other intervertebral disc degeneration, lumbar region: M51.36

## 2017-06-23 HISTORY — DX: Insomnia, unspecified: G47.00

## 2017-06-23 HISTORY — DX: Headache: R51

## 2017-06-23 NOTE — Pre-Procedure Instructions (Signed)
Component Name Value Ref Range  Vent Rate (bpm) 79   PR Interval (msec) 164   QRS Interval (msec) 80   QT Interval (msec) 386   QTc (msec) 442   Result Narrative  Normal sinus rhythm Normal ECG  No previous ECGs available I reviewed and concur with this report. Electronically signed PF:[email or web address], MD, DOUGLAS [phone]) on 10/09/2016 4:28:25 PM

## 2017-06-23 NOTE — Patient Instructions (Signed)
Your procedure is scheduled on: June 30, 2017 [facility]) Report to Same Day Surgery 2nd floor medical mall ([place] Entrance-take elevator on left to 2nd floor.  Check in with surgery information desk.) To find out your arrival time please call [phone] between 1PM - 3PM on  June 29, 2017 (MONDAY)   Remember: Instructions that are not followed completely may result in serious medical risk, up to and including death, or upon the discretion of your surgeon and anesthesiologist your surgery may need to be rescheduled.    _x___ 1. Do not eat food after midnight the night before your procedure. You may drink clear liquids up to 2 hours before you are scheduled to arrive at the hospital for your procedure.  Do not drink clear liquids within 2 hours of your scheduled arrival to the hospital.  Clear liquids include  --Water or Apple juice without pulp  --Clear carbohydrate beverage such as ClearFast or Gatorade  --Black Coffee or Clear Tea (No milk, no creamers, do not add anything to                  the coffee or Tea Type 1 and type 2 diabetics should only drink water.  No gum chewing or hard candies.     __x__ 2. No Alcohol for 24 hours before or after surgery.   __x__3. No Smoking for 24 prior to surgery.   ____  4. Bring all medications with you on the day of surgery if instructed.    __x__ 5. Notify your doctor if there is any change in your medical condition     (cold, fever, infections).     Do not wear jewelry, make-up, hairpins, clips or nail polish.  Do not wear lotions, powders, or perfumes.   Do not shave 48 hours prior to surgery. Men may shave face and neck.  Do not bring valuables to the hospital.    [facility] is not responsible for any belongings or valuables.               Contacts, dentures or bridgework may not be worn into surgery.  Leave your suitcase in the car. After surgery it may be brought to your room.  For patients admitted to the  hospital, discharge time is determined by your treatment team                       Patients discharged the day of surgery will not be allowed to drive home.  You will need someone to drive you home and stay with you the night of your procedure.    Please read over the following fact sheets that you were given:   [facility] Preparing for Surgery and or MRSA Information  TAKE THE FOLLOWING MEDICATIONS THE MORNING OF SURGERY WITH A SIP OF WATER :  1. Brice  2. LOPID  3. LYRICA  4.  5.  6.  ____Fleets enema or Magnesium Citrate as directed.   __ Use CHG Soap or sage wipes as directed on instruction sheet   _X___ Use inhalers on the day of surgery and bring to hospital day of surgery (USE ALBUTEROL AND Ada ALBUTEROL [place] )  ____ Stop Metformin and Janumet 2 days prior to surgery.    ____ Take 1/2 of usual insulin dose the night before surgery and none on the morning     surgery.   _x___  Follow recommendations from Cardiologist, Pulmonologist or PCP regarding          stopping Aspirin, Coumadin, Plavix ,Eliquis, Effient, or Pradaxa, and Pletal. (PATIENT STATED HAS ALREADY STOPPED ASPIRIN )  X____Stop Anti-inflammatories such as Advil, Aleve, Ibuprofen, Motrin, Naproxen, Naprosyn, Goodies powders or aspirin products. OK to take Tylenol   (PATIENT HAS STOPPED VOLTAREN TABLET )   _x___ Stop supplements until after surgery.  But may continue Vitamin D, Vitamin B, and multivitamin (STOP OMEGA -3 NOW )        ____ Bring C-Pap to the hospital.

## 2017-06-30 ENCOUNTER — Ambulatory Visit: Payer: BLUE CROSS/BLUE SHIELD | Admitting: Anesthesiology

## 2017-06-30 ENCOUNTER — Encounter: Payer: Self-pay | Admitting: *Deleted

## 2017-06-30 ENCOUNTER — Encounter
Admission: RE | Disposition: A | Discharge status: Home / Self Care | Payer: Self-pay | Source: Ambulatory Visit | Attending: Unknown Physician Specialty

## 2017-06-30 ENCOUNTER — Ambulatory Visit
Admission: RE | Admit: 2017-06-30 | Discharge: 2017-06-30 | Disposition: A | Discharge status: Home / Self Care | Payer: BLUE CROSS/BLUE SHIELD | Source: Ambulatory Visit | Attending: Unknown Physician Specialty | Admitting: Unknown Physician Specialty

## 2017-06-30 DIAGNOSIS — F419 Anxiety disorder, unspecified: Secondary | ICD-10-CM | POA: Diagnosis not present

## 2017-06-30 DIAGNOSIS — K219 Gastro-esophageal reflux disease without esophagitis: Secondary | ICD-10-CM | POA: Diagnosis not present

## 2017-06-30 DIAGNOSIS — Z79899 Other long term (current) drug therapy: Secondary | ICD-10-CM | POA: Diagnosis not present

## 2017-06-30 DIAGNOSIS — Z885 Allergy status to narcotic agent status: Secondary | ICD-10-CM | POA: Insufficient documentation

## 2017-06-30 DIAGNOSIS — Z91030 Bee allergy status: Secondary | ICD-10-CM | POA: Diagnosis not present

## 2017-06-30 DIAGNOSIS — D110 Benign neoplasm of parotid gland: Secondary | ICD-10-CM | POA: Insufficient documentation

## 2017-06-30 DIAGNOSIS — F17200 Nicotine dependence, unspecified, uncomplicated: Secondary | ICD-10-CM | POA: Diagnosis not present

## 2017-06-30 DIAGNOSIS — R221 Localized swelling, mass and lump, neck: Secondary | ICD-10-CM | POA: Diagnosis present

## 2017-06-30 DIAGNOSIS — Z7982 Long term (current) use of aspirin: Secondary | ICD-10-CM | POA: Diagnosis not present

## 2017-06-30 DIAGNOSIS — Z888 Allergy status to other drugs, medicaments and biological substances status: Secondary | ICD-10-CM | POA: Diagnosis not present

## 2017-06-30 DIAGNOSIS — I10 Essential (primary) hypertension: Secondary | ICD-10-CM | POA: Insufficient documentation

## 2017-06-30 DIAGNOSIS — J449 Chronic obstructive pulmonary disease, unspecified: Secondary | ICD-10-CM | POA: Diagnosis not present

## 2017-06-30 HISTORY — PX: PAROTIDECTOMY: SHX2163

## 2017-06-30 SURGERY — EXCISION, PAROTID GLAND
Anesthesia: General | Laterality: Left

## 2017-06-30 MED ORDER — SODIUM CHLORIDE 0.9 % IV SOLN
INTRAVENOUS | Status: DC | PRN
  Administered 2017-06-30: 15 ug/min via INTRAVENOUS

## 2017-06-30 MED ORDER — ACETAMINOPHEN 10 MG/ML IV SOLN
INTRAVENOUS | Status: AC
  Filled 2017-06-30: qty 100

## 2017-06-30 MED ORDER — PROPOFOL 10 MG/ML IV BOLUS
INTRAVENOUS | Status: AC
  Filled 2017-06-30: qty 20

## 2017-06-30 MED ORDER — OXYCODONE-ACETAMINOPHEN 5-325 MG PO TABS
ORAL_TABLET | ORAL | Status: AC
  Filled 2017-06-30: qty 1

## 2017-06-30 MED ORDER — MIDAZOLAM HCL 2 MG/2ML IJ SOLN
INTRAMUSCULAR | Status: DC | PRN
  Administered 2017-06-30: 2 mg via INTRAVENOUS

## 2017-06-30 MED ORDER — METOPROLOL TARTRATE 5 MG/5ML IV SOLN
INTRAVENOUS | Status: AC
  Filled 2017-06-30: qty 5

## 2017-06-30 MED ORDER — FENTANYL CITRATE (PF) 100 MCG/2ML IJ SOLN
INTRAMUSCULAR | Status: AC
  Filled 2017-06-30: qty 2

## 2017-06-30 MED ORDER — ONDANSETRON HCL 4 MG/2ML IJ SOLN
INTRAMUSCULAR | Status: DC | PRN
  Administered 2017-06-30: 4 mg via INTRAVENOUS

## 2017-06-30 MED ORDER — OXYCODONE-ACETAMINOPHEN 5-325 MG PO TABS: 1.0000 | ORAL_TABLET | Freq: Four times a day (QID) | ORAL | 0 refills | Status: DC | PRN

## 2017-06-30 MED ORDER — FENTANYL CITRATE (PF) 100 MCG/2ML IJ SOLN
25.0000 ug | INTRAMUSCULAR | Status: AC | PRN
  Administered 2017-06-30 (×6): 25 ug via INTRAVENOUS

## 2017-06-30 MED ORDER — LIDOCAINE-EPINEPHRINE 1 %-1:100000 IJ SOLN
INTRAMUSCULAR | Status: AC
  Filled 2017-06-30: qty 1

## 2017-06-30 MED ORDER — ACETAMINOPHEN 10 MG/ML IV SOLN
INTRAVENOUS | Status: DC | PRN
  Administered 2017-06-30: 1000 mg via INTRAVENOUS

## 2017-06-30 MED ORDER — PROPOFOL 10 MG/ML IV BOLUS
INTRAVENOUS | Status: DC | PRN
  Administered 2017-06-30: 30 mg via INTRAVENOUS
  Administered 2017-06-30: 50 mg via INTRAVENOUS
  Administered 2017-06-30: 170 mg via INTRAVENOUS
  Administered 2017-06-30: 20 mg via INTRAVENOUS

## 2017-06-30 MED ORDER — FENTANYL CITRATE (PF) 100 MCG/2ML IJ SOLN
INTRAMUSCULAR | Status: DC | PRN
  Administered 2017-06-30 (×6): 50 ug via INTRAVENOUS

## 2017-06-30 MED ORDER — LIDOCAINE-EPINEPHRINE 1 %-1:100000 IJ SOLN
INTRAMUSCULAR | Status: DC | PRN
  Administered 2017-06-30: 4 mL

## 2017-06-30 MED ORDER — METOPROLOL TARTRATE 5 MG/5ML IV SOLN
INTRAVENOUS | Status: DC | PRN
  Administered 2017-06-30 (×2): 1 mg via INTRAVENOUS

## 2017-06-30 MED ORDER — OXYCODONE-ACETAMINOPHEN 5-325 MG PO TABS
1.0000 | ORAL_TABLET | Freq: Four times a day (QID) | ORAL | Status: DC | PRN
  Administered 2017-06-30: 1 via ORAL

## 2017-06-30 MED ORDER — MIDAZOLAM HCL 2 MG/2ML IJ SOLN
INTRAMUSCULAR | Status: AC
  Filled 2017-06-30: qty 2

## 2017-06-30 MED ORDER — LACTATED RINGERS IV SOLN
INTRAVENOUS | Status: DC
  Administered 2017-06-30: 06:00:00 via INTRAVENOUS

## 2017-06-30 MED ORDER — SUCCINYLCHOLINE CHLORIDE 20 MG/ML IJ SOLN
INTRAMUSCULAR | Status: DC | PRN
  Administered 2017-06-30: 120 mg via INTRAVENOUS

## 2017-06-30 MED ORDER — FENTANYL CITRATE (PF) 100 MCG/2ML IJ SOLN
INTRAMUSCULAR | Status: AC
  Administered 2017-06-30: 25 ug via INTRAVENOUS
  Filled 2017-06-30: qty 2

## 2017-06-30 MED ORDER — LIDOCAINE HCL (CARDIAC) 20 MG/ML IV SOLN
INTRAVENOUS | Status: DC | PRN
  Administered 2017-06-30: 40 mg via INTRAVENOUS

## 2017-06-30 MED ORDER — DEXAMETHASONE SODIUM PHOSPHATE 10 MG/ML IJ SOLN
INTRAMUSCULAR | Status: DC | PRN
  Administered 2017-06-30: 10 mg via INTRAVENOUS

## 2017-06-30 MED ORDER — ONDANSETRON HCL 4 MG/2ML IJ SOLN: 4.0000 mg | Freq: Once | INTRAMUSCULAR | Status: DC | PRN

## 2017-06-30 SURGICAL SUPPLY — 46 items
ADH LQ OCL WTPRF AMP STRL LF (MISCELLANEOUS)
ADH SKN CLS APL DERMABOND .7 (GAUZE/BANDAGES/DRESSINGS) ×1
ADHESIVE MASTISOL STRL (MISCELLANEOUS) ×1 IMPLANT
BLADE SURG 15 STRL LF DISP TIS (BLADE) ×1 IMPLANT
BLADE SURG 15 STRL SS (BLADE) ×3
CANISTER SUCT 1200ML W/VALVE (MISCELLANEOUS) ×3 IMPLANT
CORD BIP STRL DISP 12FT (MISCELLANEOUS) ×3 IMPLANT
DERMABOND ADVANCED (GAUZE/BANDAGES/DRESSINGS) ×2
DERMABOND ADVANCED .7 DNX12 (GAUZE/BANDAGES/DRESSINGS) ×1 IMPLANT
DRAIN TLS ROUND 10FR (DRAIN) ×1 IMPLANT
DRAPE MAG INST 16X20 L/F (DRAPES) ×3 IMPLANT
DRAPE SURG 17X11 SM STRL (DRAPES) ×3 IMPLANT
DRSG TEGADERM 2-3/8X2-3/4 SM (GAUZE/BANDAGES/DRESSINGS) ×9 IMPLANT
ELECT CAUTERY BLADE TIP 2.5 (TIP) ×3
ELECT EMG 20MM DUAL (MISCELLANEOUS) ×6
ELECT NEEDLE 20X.3 GREEN (MISCELLANEOUS) ×3
ELECT REM PT RETURN 9FT ADLT (ELECTROSURGICAL) ×3
ELECTRODE CAUTERY BLDE TIP 2.5 (TIP) ×1 IMPLANT
ELECTRODE EMG 20MM DUAL (MISCELLANEOUS) ×2 IMPLANT
ELECTRODE NDL 20X.3 GREEN (MISCELLANEOUS) ×1 IMPLANT
ELECTRODE NEEDLE 20X.3 GREEN (MISCELLANEOUS) ×1 IMPLANT
ELECTRODE REM PT RTRN 9FT ADLT (ELECTROSURGICAL) ×1 IMPLANT
FORCEPS JEWEL BIP 4-3/4 STR (INSTRUMENTS) ×3 IMPLANT
GLOVE BIO SURGEON STRL SZ7.5 (GLOVE) ×6 IMPLANT
GOWN STRL REUS W/ TWL LRG LVL3 (GOWN DISPOSABLE) ×2 IMPLANT
GOWN STRL REUS W/TWL LRG LVL3 (GOWN DISPOSABLE) ×6
HEMOSTAT SURGICEL 2X3 (HEMOSTASIS) ×1 IMPLANT
HOOK STAY 5M SHARP BLUNT 3316- (MISCELLANEOUS) ×3 IMPLANT
JACKSON PRATT 7MM (INSTRUMENTS) ×2 IMPLANT
KIT RM TURNOVER STRD PROC AR (KITS) ×3 IMPLANT
LABEL OR SOLS (LABEL) ×1 IMPLANT
MARKER SKIN DUAL TIP RULER LAB (MISCELLANEOUS) ×3 IMPLANT
NS IRRIG 500ML POUR BTL (IV SOLUTION) ×3 IMPLANT
PACK HEAD/NECK (MISCELLANEOUS) ×3 IMPLANT
PROBE MONO 100X0.75 ELECT 1.9M (MISCELLANEOUS) ×3 IMPLANT
SHEARS HARMONIC 9CM CVD (BLADE) ×3 IMPLANT
SPONGE KITTNER 5P (MISCELLANEOUS) ×7 IMPLANT
SPONGE XRAY 4X4 16PLY STRL (MISCELLANEOUS) ×7 IMPLANT
STAPLER SKIN PROX 35W (STAPLE) ×3 IMPLANT
SUT PROLENE 5 0 PS 3 (SUTURE) ×3 IMPLANT
SUT SILK 2 0 (SUTURE) ×3
SUT SILK 2-0 30XBRD TIE 12 (SUTURE) ×1 IMPLANT
SUT SILK 3 0 (SUTURE) ×3
SUT SILK 3-0 18XBRD TIE 12 (SUTURE) ×1 IMPLANT
SUT VIC AB 4-0 RB1 18 (SUTURE) ×6 IMPLANT
SYSTEM CHEST DRAIN TLS 7FR (DRAIN) ×1 IMPLANT

## 2017-06-30 NOTE — Op Note (Signed)
06/30/2017  9:45 AM    Tresa Res  [id number]   Pre-Op Dx: Left parotid mass  Post-op Dx: SAME  Proc: left superficial parotidectomy;facial nerve monitoring 2 hours  Surg:  Beverly Gust T  Asst.:Juengel  Anes:  GOT  EBL:  Less than 10 cc  Comp:  none  Findings:  2 separate masses within the inferior aspect of the superficial lobe of  Parotid. Normal facial nerve anatomy  Procedure: Lem was identified in the holding area taken the operating room placed in supine position.after general endotracheal anesthesia the table was turned 90. The facial nerve monitor was applied and tested and remained on throughout the procedure. The mass could be palpated in the inferior aspect and superficial lobe of the parotid. Incision line was marked in the preauricular area down onto the upper neck. A local anesthetic of 1% lidocaine with 1 100,000pinephrine was used to inject the incision line.  A total of 5 cc was used The left face was then prepped and draped in sterile fashion. A 15 blade was used to incise down to the SMAS hemostasis was achieved using the Bovie cautery. A supra-SMAS flap was elevated onto the face posteriorly. Hypertension stays were then placed. The pretragal fascia was identified the parotid was dissected anteriorly away from the tragal cartilage. This extended posteriorly to the anterior aspect of the sternocleidomastoid muscle.The Harmonic scalpel was used for hemostasis. The parotid was dissected anteriorly down the tympanosquamous suture line any facial nerve was identified and stimulated. All branches were intact. The parotid was then dissected off of the facial nerve using blunt dissection the Harmonic scalpel.His inferior aspect was dissected free froe sternocleidomastoid muscle a mass was identified which appeared to be either tumor or lymph node. Careful dissection around this to prevent capsular opening was performed. This was sent for frozen section and was  consistent with a Warthin's tumors.Another masses palpated within the gland therefore thetion continued along the branches of the facial nerve tracking down all branches. His inferior aspect of the superficial lobe of the  parootid was lifted anteriorly another mass was identified which appeared to be similar to the previous mass.this mass was left with the superficial lobe of the parotid as it  was dissected over the anterior portion of the facial nerve. The fascia was then divided anteriorly removing the superficial lobe and the inferior mass. With all masses removed the facial nerve was beautifully identified.This was stimulated and all branches remained intact. The wounds was copiously irrigated with saline any small bleeding vessels were cauterized using the microbipolar. With no active bleeding a #7 Jackson-Pratt drain was brought of the wound inferiorly the SMAS layer was closed using 4-0 Vicryl, the subcutaneous layers were closed using 4-0 Vicryl and the skin was closed using Dermabond. A 2-0 silk was used as a drain stitch. The patient was in n anesthesia where he was awakened in thoperating room taken recovein stable condition.  Cultures: None  Specimens: Deep parotid mas and left superficial lobe parotid  Dispo:   good  Plan:  Discharged home will be taught how to empty drain every 4-6 hours return to office 2 days  Ivory Maduro T  06/30/2017 9:45 AM

## 2017-06-30 NOTE — Discharge Instructions (Signed)

## 2017-06-30 NOTE — Anesthesia Post-op Follow-up Note (Signed)
Anesthesia QCDR form completed.        

## 2017-06-30 NOTE — OR Nursing (Signed)
Instructions given to pt and family about JP drain specifically how to empty, record drainage, recharge drain and milk drain line if needed.

## 2017-06-30 NOTE — Anesthesia Procedure Notes (Signed)
Procedure Name: Intubation Performed by: Demetrius Charity Pre-anesthesia Checklist: Patient identified, Patient being monitored, Timeout performed, Emergency Drugs available and Suction available Patient Re-evaluated:Patient Re-evaluated prior to induction Oxygen Delivery Method: Circle system utilized Preoxygenation: Pre-oxygenation with 100% oxygen Induction Type: IV induction Ventilation: Oral airway inserted - appropriate to patient size and Mask ventilation without difficulty Laryngoscope Size: Mac and 4 Grade View: Grade II Tube type: Oral Tube size: 7.5 mm Number of attempts: 1 Airway Equipment and Method: Stylet Placement Confirmation: ETT inserted through vocal cords under direct vision,  positive ETCO2 and breath sounds checked- equal and bilateral Secured at: 23 cm Tube secured with: Tape Dental Injury: Teeth and Oropharynx as per pre-operative assessment

## 2017-06-30 NOTE — Anesthesia Postprocedure Evaluation (Signed)
Anesthesia Post Note  Patient: Anthony Bender  Procedure(s) Performed: Procedure(s) (LRB): PAROTIDECTOMY (Left)  Patient location during evaluation: PACU Anesthesia Type: General Level of consciousness: awake and alert Pain management: pain level controlled Vital Signs Assessment: post-procedure vital signs reviewed and stable Respiratory status: spontaneous breathing and respiratory function stable (placed on her CPAP) Cardiovascular status: stable Anesthetic complications: no     Last Vitals:  Vitals:   06/30/17 0958 06/30/17 1008  BP: 140/88   Pulse: 79 81  Resp: 12 13  Temp:    SpO2: 98% 97%    Last Pain:  Vitals:   06/30/17 0958  TempSrc:   PainSc: Asleep                 KEPHART,WILLIAM K

## 2017-06-30 NOTE — Transfer of Care (Signed)
Immediate Anesthesia Transfer of Care Note  Patient: Anthony Bender  Procedure(s) Performed: Procedure(s): PAROTIDECTOMY (Left)  Patient Location: PACU  Anesthesia Type:General  Level of Consciousness: sedated  Airway & Oxygen Therapy: Patient Spontanous Breathing and Patient connected to face mask oxygen  Post-op Assessment: Report given to RN and Post -op Vital signs reviewed and stable  Post vital signs: Reviewed and stable  Last Vitals:  Vitals:   06/30/17 0619 06/30/17 0943  BP: (!) 126/92 135/82  Pulse: 90 80  Resp: 18 (!) 9  Temp: 36.8 C (!) 36.2 C  SpO2: 96% 98%    Last Pain:  Vitals:   06/30/17 0619  TempSrc: Oral  PainSc: 6          Complications: No apparent anesthesia complications

## 2017-06-30 NOTE — Anesthesia Preprocedure Evaluation (Signed)
Anesthesia Evaluation  Patient identified by MRN, date of birth, ID band Patient awake    Reviewed: Allergy & Precautions, NPO status , Patient's Chart, lab work & pertinent test results  History of Anesthesia Complications Negative for: history of anesthetic complications  Airway Mallampati: II       Dental   Pulmonary asthma , COPD,  COPD inhaler, Current Smoker,           Cardiovascular hypertension, Pt. on medications      Neuro/Psych neg Seizures Anxiety    GI/Hepatic Neg liver ROS, GERD  Medicated and Controlled,  Endo/Other  neg diabetes  Renal/GU negative Renal ROS     Musculoskeletal   Abdominal   Peds  Hematology   Anesthesia Other Findings   Reproductive/Obstetrics                             Anesthesia Physical Anesthesia Plan  ASA: II  Anesthesia Plan: General   Post-op Pain Management:    Induction:   PONV Risk Score and Plan: 1 and Ondansetron and Dexamethasone  Airway Management Planned: Oral ETT  Additional Equipment:   Intra-op Plan:   Post-operative Plan:   Informed Consent: I have reviewed the patients History and Physical, chart, labs and discussed the procedure including the risks, benefits and alternatives for the proposed anesthesia with the patient or authorized representative who has indicated his/her understanding and acceptance.     Plan Discussed with:   Anesthesia Plan Comments:         Anesthesia Quick Evaluation

## 2017-06-30 NOTE — H&P (Signed)
The patient's history has been reviewed, patient examined, no change in status, stable for surgery.  Questions were answered to the patients satisfaction.  

## 2017-07-01 LAB — SURGICAL PATHOLOGY

## 2017-11-10 ENCOUNTER — Other Ambulatory Visit: Payer: Self-pay | Admitting: Orthopedic Surgery

## 2017-11-10 DIAGNOSIS — M4802 Spinal stenosis, cervical region: Secondary | ICD-10-CM

## 2017-11-10 DIAGNOSIS — M5412 Radiculopathy, cervical region: Secondary | ICD-10-CM

## 2017-11-10 DIAGNOSIS — M5030 Other cervical disc degeneration, unspecified cervical region: Secondary | ICD-10-CM

## 2017-11-20 ENCOUNTER — Ambulatory Visit
Admission: RE | Admit: 2017-11-20 | Discharge: 2017-11-20 | Disposition: A | Discharge status: Home / Self Care | Payer: BLUE CROSS/BLUE SHIELD | Source: Ambulatory Visit | Attending: Orthopedic Surgery | Admitting: Orthopedic Surgery

## 2017-11-20 DIAGNOSIS — M4802 Spinal stenosis, cervical region: Secondary | ICD-10-CM | POA: Diagnosis not present

## 2017-11-20 DIAGNOSIS — M50222 Other cervical disc displacement at C5-C6 level: Secondary | ICD-10-CM | POA: Insufficient documentation

## 2017-11-20 DIAGNOSIS — M5412 Radiculopathy, cervical region: Secondary | ICD-10-CM | POA: Insufficient documentation

## 2017-11-20 DIAGNOSIS — M5030 Other cervical disc degeneration, unspecified cervical region: Secondary | ICD-10-CM | POA: Diagnosis not present

## 2018-01-12 ENCOUNTER — Other Ambulatory Visit: Payer: Self-pay

## 2018-01-12 ENCOUNTER — Ambulatory Visit (INDEPENDENT_AMBULATORY_CARE_PROVIDER_SITE_OTHER): Payer: BLUE CROSS/BLUE SHIELD | Admitting: Psychiatry

## 2018-01-12 ENCOUNTER — Encounter: Payer: Self-pay | Admitting: Psychiatry

## 2018-01-12 VITALS — BP 127/87 | HR 97 | Temp 97.8°F | Wt 193.4 lb

## 2018-01-12 DIAGNOSIS — F5104 Psychophysiologic insomnia: Secondary | ICD-10-CM | POA: Diagnosis not present

## 2018-01-12 DIAGNOSIS — F330 Major depressive disorder, recurrent, mild: Secondary | ICD-10-CM

## 2018-01-12 DIAGNOSIS — F17200 Nicotine dependence, unspecified, uncomplicated: Secondary | ICD-10-CM

## 2018-01-12 DIAGNOSIS — N400 Enlarged prostate without lower urinary tract symptoms: Secondary | ICD-10-CM | POA: Insufficient documentation

## 2018-01-12 DIAGNOSIS — J45909 Unspecified asthma, uncomplicated: Secondary | ICD-10-CM | POA: Insufficient documentation

## 2018-01-12 DIAGNOSIS — F1290 Cannabis use, unspecified, uncomplicated: Secondary | ICD-10-CM

## 2018-01-12 DIAGNOSIS — K2270 Barrett's esophagus without dysplasia: Secondary | ICD-10-CM | POA: Insufficient documentation

## 2018-01-12 DIAGNOSIS — E785 Hyperlipidemia, unspecified: Secondary | ICD-10-CM | POA: Insufficient documentation

## 2018-01-12 DIAGNOSIS — F411 Generalized anxiety disorder: Secondary | ICD-10-CM | POA: Diagnosis not present

## 2018-01-12 MED ORDER — MIRTAZAPINE 7.5 MG PO TABS: 7.5000 mg | ORAL_TABLET | Freq: Every day | ORAL | 1 refills | Status: DC

## 2018-01-12 MED ORDER — DULOXETINE HCL 30 MG PO CPEP: 30.0000 mg | ORAL_CAPSULE | Freq: Every day | ORAL | 1 refills | Status: DC

## 2018-01-12 MED ORDER — ZOLPIDEM TARTRATE 10 MG PO TABS: 5.0000 mg | ORAL_TABLET | Freq: Every day | ORAL | 0 refills | Status: AC

## 2018-01-12 MED ORDER — HYDROXYZINE PAMOATE 25 MG PO CAPS: 25.0000 mg | ORAL_CAPSULE | Freq: Two times a day (BID) | ORAL | 1 refills | Status: DC | PRN
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
## 2018-01-12 NOTE — Patient Instructions (Signed)
Hydroxyzine capsules or tablets What is this medicine? HYDROXYZINE (hye Felsenthal i zeen) is an antihistamine. This medicine is used to treat allergy symptoms. It is also used to treat anxiety and tension. This medicine can be used with other medicines to induce sleep before surgery. This medicine may be used for other purposes; ask your health care provider or pharmacist if you have questions. COMMON BRAND NAME(S): ANX, Atarax, Rezine, Vistaril What should I tell my health care provider before I take this medicine? They need to know if you have any of these conditions: -any chronic illness -difficulty passing urine -glaucoma -heart disease -kidney disease -liver disease -lung disease -an unusual or allergic reaction to hydroxyzine, cetirizine, other medicines, foods, dyes, or preservatives -pregnant or trying to get pregnant -breast-feeding How should I use this medicine? Take this medicine by mouth with a full glass of water. Follow the directions on the prescription label. You may take this medicine with food or on an empty stomach. Take your medicine at regular intervals. Do not take your medicine more often than directed. Talk to your pediatrician regarding the use of this medicine in children. Special care may be needed. While this drug may be prescribed for children as young as 27 years of age for selected conditions, precautions do apply. Patients over 75 years old may have a stronger reaction and need a smaller dose. Overdosage: If you think you have taken too much of this medicine contact a poison control center or emergency room at once. NOTE: This medicine is only for you. Do not share this medicine with others. What if I miss a dose? If you miss a dose, take it as soon as you can. If it is almost time for your next dose, take only that dose. Do not take double or extra doses. What may interact with this medicine? -alcohol -barbiturate medicines for sleep or  seizures -medicines for colds, allergies -medicines for depression, anxiety, or emotional disturbances -medicines for pain -medicines for sleep -muscle relaxants This list may not describe all possible interactions. Give your health care provider a list of all the medicines, herbs, non-prescription drugs, or dietary supplements you use. Also tell them if you smoke, drink alcohol, or use illegal drugs. Some items may interact with your medicine. What should I watch for while using this medicine? Tell your doctor or health care professional if your symptoms do not improve. You may get drowsy or dizzy. Do not drive, use machinery, or do anything that needs mental alertness until you know how this medicine affects you. Do not stand or sit up quickly, especially if you are an older patient. This reduces the risk of dizzy or fainting spells. Alcohol may interfere with the effect of this medicine. Avoid alcoholic drinks. Your mouth may get dry. Chewing sugarless gum or sucking hard candy, and drinking plenty of water may help. Contact your doctor if the problem does not go away or is severe. This medicine may cause dry eyes and blurred vision. If you wear contact lenses you may feel some discomfort. Lubricating drops may help. See your eye doctor if the problem does not go away or is severe. If you are receiving skin tests for allergies, tell your doctor you are using this medicine. What side effects may I notice from receiving this medicine? Side effects that you should report to your doctor or health care professional as soon as possible: -fast or irregular heartbeat -difficulty passing urine -seizures -slurred speech or confusion -tremor  Side effects that usually do not require medical attention (report to your doctor or health care professional if they continue or are bothersome): -constipation -drowsiness -fatigue -headache -stomach upset This list may not describe all possible side effects.  Call your doctor for medical advice about side effects. You may report side effects to FDA at 1-800-FDA-1088. Where should I keep my medicine? Keep out of the reach of children. Store at room temperature between 15 and 30 degrees C (59 and 86 degrees F). Keep container tightly closed. Throw away any unused medicine after the expiration date. NOTE: This sheet is a summary. It may not cover all possible information. If you have questions about this medicine, talk to your doctor, pharmacist, or health care provider.  2018 Elsevier/Gold Standard (2008-02-04 14:50:59) Mirtazapine tablets What is this medicine? MIRTAZAPINE (mir TAZ a peen) is used to treat depression. This medicine may be used for other purposes; ask your health care provider or pharmacist if you have questions. COMMON BRAND NAME(S): Remeron What should I tell my health care provider before I take this medicine? They need to know if you have any of these conditions: -bipolar disorder -glaucoma -kidney disease -liver disease -suicidal thoughts -an unusual or allergic reaction to mirtazapine, other medicines, foods, dyes, or preservatives -pregnant or trying to get pregnant -breast-feeding How should I use this medicine? Take this medicine by mouth with a glass of water. Follow the directions on the prescription label. Take your medicine at regular intervals. Do not take your medicine more often than directed. Do not stop taking this medicine suddenly except upon the advice of your doctor. Stopping this medicine too quickly may cause serious side effects or your condition may worsen. A special MedGuide will be given to you by the pharmacist with each prescription and refill. Be sure to read this information carefully each time. Talk to your pediatrician regarding the use of this medicine in children. Special care may be needed. Overdosage: If you think you have taken too much of this medicine contact a poison control center or  emergency room at once. NOTE: This medicine is only for you. Do not share this medicine with others. What if I miss a dose? If you miss a dose, take it as soon as you can. If it is almost time for your next dose, take only that dose. Do not take double or extra doses. What may interact with this medicine? Do not take this medicine with any of the following medications: -linezolid -MAOIs like Carbex, Eldepryl, Marplan, Nardil, and Parnate -methylene blue (injected into a vein) This medicine may also interact with the following medications: -alcohol -antiviral medicines for HIV or AIDS -certain medicines that treat or prevent blood clots like warfarin -certain medicines for depression, anxiety, or psychotic disturbances -certain medicines for fungal infections like ketoconazole and itraconazole -certain medicines for migraine headache like almotriptan, eletriptan, frovatriptan, naratriptan, rizatriptan, sumatriptan, zolmitriptan -certain medicines for seizures like carbamazepine or phenytoin -certain medicines for sleep -cimetidine -erythromycin -fentanyl -lithium -medicines for blood pressure -nefazodone -rasagiline -rifampin -supplements like St. John's wort, kava kava, valerian -tramadol -tryptophan This list may not describe all possible interactions. Give your health care provider a list of all the medicines, herbs, non-prescription drugs, or dietary supplements you use. Also tell them if you smoke, drink alcohol, or use illegal drugs. Some items may interact with your medicine. What should I watch for while using this medicine? Tell your doctor if your symptoms do not get better or if they get worse.  Visit your doctor or health care professional for regular checks on your progress. Because it may take several weeks to see the full effects of this medicine, it is important to continue your treatment as prescribed by your doctor. Patients and their families should watch out for new  or worsening thoughts of suicide or depression. Also watch out for sudden changes in feelings such as feeling anxious, agitated, panicky, irritable, hostile, aggressive, impulsive, severely restless, overly excited and hyperactive, or not being able to sleep. If this happens, especially at the beginning of treatment or after a change in dose, call your health care professional. Dennis Bast may get drowsy or dizzy. Do not drive, use machinery, or do anything that needs mental alertness until you know how this medicine affects you. Do not stand or sit up quickly, especially if you are an older patient. This reduces the risk of dizzy or fainting spells. Alcohol may interfere with the effect of this medicine. Avoid alcoholic drinks. This medicine may cause dry eyes and blurred vision. If you wear contact lenses you may feel some discomfort. Lubricating drops may help. See your eye doctor if the problem does not go away or is severe. Your mouth may get dry. Chewing sugarless gum or sucking hard candy, and drinking plenty of water may help. Contact your doctor if the problem does not go away or is severe. What side effects may I notice from receiving this medicine? Side effects that you should report to your doctor or health care professional as soon as possible: -allergic reactions like skin rash, itching or hives, swelling of the face, lips, or tongue -anxious -changes in vision -chest pain -confusion -elevated mood, decreased need for sleep, racing thoughts, impulsive behavior -eye pain -fast, irregular heartbeat -feeling faint or lightheaded, falls -feeling agitated, angry, or irritable -fever or chills, sore throat -hallucination, loss of contact with reality -loss of balance or coordination -mouth sores -redness, blistering, peeling or loosening of the skin, including inside the mouth -restlessness, pacing, inability to keep still -seizures -stiff muscles -suicidal thoughts or other mood  changes -trouble passing urine or change in the amount of urine -trouble sleeping -unusual bleeding or bruising -unusually weak or tired -vomiting Side effects that usually do not require medical attention (report to your doctor or health care professional if they continue or are bothersome): -change in appetite -constipation -dizziness -dry mouth -muscle aches or pains -nausea -tired -weight gain This list may not describe all possible side effects. Call your doctor for medical advice about side effects. You may report side effects to FDA at 1-800-FDA-1088. Where should I keep my medicine? Keep out of the reach of children. Store at room temperature between 15 and 30 degrees C (59 and 86 degrees F) Protect from light and moisture. Throw away any unused medicine after the expiration date. NOTE: This sheet is a summary. It may not cover all possible information. If you have questions about this medicine, talk to your doctor, pharmacist, or health care provider.  2018 Elsevier/Gold Standard (2016-02-21 17:30:45)  PLEASE START WEANING OFF KLONOPIN. PLEASE TAKE KLONOPIN AS NEEDED EVERY TWO DAYS OR SO , ONLY FOR SEVERE ANXIETY AND AGITATION AND STOP TAKING IT IN TWO WEEKS.  PLEASE USE HYDROXYZINE AS NEEDED IN PLACE OF KLONOPIN FOR ANXIETY SX AS NEEDED.  PLEASE CUT YOU AMBIEN IN TO HALF AND TAKE IT FOR SLEEP SINCE YOU ARE ALSO ON ANOTHER SLEEP AID NOW CALLED REMERON ( MIRTAZEPINE)  PLEASE START TAKING CYMBALTA 30 MG STARTING TODAY.

## 2018-01-13 ENCOUNTER — Encounter: Payer: Self-pay | Admitting: Psychiatry

## 2018-02-09 ENCOUNTER — Ambulatory Visit (INDEPENDENT_AMBULATORY_CARE_PROVIDER_SITE_OTHER): Payer: BLUE CROSS/BLUE SHIELD | Admitting: Psychiatry

## 2018-02-09 ENCOUNTER — Other Ambulatory Visit: Payer: Self-pay

## 2018-02-09 ENCOUNTER — Ambulatory Visit (INDEPENDENT_AMBULATORY_CARE_PROVIDER_SITE_OTHER): Payer: BLUE CROSS/BLUE SHIELD | Admitting: Licensed Clinical Social Worker

## 2018-02-09 ENCOUNTER — Encounter: Payer: Self-pay | Admitting: Psychiatry

## 2018-02-09 VITALS — BP 149/96 | HR 86 | Temp 97.7°F | Wt 194.0 lb

## 2018-02-09 DIAGNOSIS — F5104 Psychophysiologic insomnia: Secondary | ICD-10-CM

## 2018-02-09 DIAGNOSIS — F411 Generalized anxiety disorder: Secondary | ICD-10-CM

## 2018-02-09 DIAGNOSIS — F330 Major depressive disorder, recurrent, mild: Secondary | ICD-10-CM | POA: Diagnosis not present

## 2018-02-09 DIAGNOSIS — F17200 Nicotine dependence, unspecified, uncomplicated: Secondary | ICD-10-CM

## 2018-02-09 MED ORDER — MIRTAZAPINE 15 MG PO TABS: 15.0000 mg | ORAL_TABLET | Freq: Every day | ORAL | 1 refills | Status: DC
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
## 2018-02-10 ENCOUNTER — Encounter: Payer: Self-pay | Admitting: Psychiatry

## 2018-03-09 ENCOUNTER — Ambulatory Visit: Payer: BLUE CROSS/BLUE SHIELD | Admitting: Psychiatry

## 2018-03-15 ENCOUNTER — Encounter: Payer: Self-pay | Admitting: Psychiatry

## 2018-03-15 ENCOUNTER — Ambulatory Visit (INDEPENDENT_AMBULATORY_CARE_PROVIDER_SITE_OTHER): Payer: BLUE CROSS/BLUE SHIELD | Admitting: Psychiatry

## 2018-03-15 ENCOUNTER — Other Ambulatory Visit: Payer: Self-pay

## 2018-03-15 VITALS — BP 136/89 | HR 89 | Temp 97.6°F | Wt 195.6 lb

## 2018-03-15 DIAGNOSIS — F411 Generalized anxiety disorder: Secondary | ICD-10-CM

## 2018-03-15 DIAGNOSIS — F17200 Nicotine dependence, unspecified, uncomplicated: Secondary | ICD-10-CM | POA: Diagnosis not present

## 2018-03-15 DIAGNOSIS — F1290 Cannabis use, unspecified, uncomplicated: Secondary | ICD-10-CM | POA: Diagnosis not present

## 2018-03-15 DIAGNOSIS — F330 Major depressive disorder, recurrent, mild: Secondary | ICD-10-CM

## 2018-03-15 MED ORDER — MIRTAZAPINE 30 MG PO TABS: 30.0000 mg | ORAL_TABLET | Freq: Every day | ORAL | 2 refills | Status: DC

## 2018-03-15 MED ORDER — HYDROXYZINE PAMOATE 25 MG PO CAPS: 25.0000 mg | ORAL_CAPSULE | Freq: Two times a day (BID) | ORAL | 2 refills | Status: DC | PRN
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
## 2018-03-25 ENCOUNTER — Encounter

## 2018-03-25 ENCOUNTER — Ambulatory Visit: Payer: BLUE CROSS/BLUE SHIELD | Admitting: Licensed Clinical Social Worker

## 2018-04-22 ENCOUNTER — Telehealth: Payer: Self-pay

## 2018-04-22 DIAGNOSIS — F330 Major depressive disorder, recurrent, mild: Secondary | ICD-10-CM

## 2018-04-22 NOTE — Telephone Encounter (Signed)
Medication management - Telephone call with pt, after he left a message requesting a call back.  States he is getting upset easily with his sons, agitated often and he and his PCP from [facility] do not feel his medication is working effectively at this time.  Patient requests a change.  Stated he is easily angered and is taking his prescribed Hydroxyzine, Remeron and Ambien as prescribed.  Patient denied any current suicidal or homicidal ideations and no plan or intent to harm self or others but admitted this has been getting worse.  Stated his PCP was even concerned he may need hospitalization if continued to worsen which patient denied and again stated he has no plan or intent to harm self or others but does feel a medication adjustment may be warranted.  Agreed to send a message to a covering provider since Dr. Shea Evans is currently out of the country and patient agreed with plan.

## 2018-04-23 MED ORDER — ARIPIPRAZOLE 2 MG PO TABS: 2.0000 mg | ORAL_TABLET | Freq: Every day | ORAL | 0 refills | Status: DC

## 2018-04-23 NOTE — Telephone Encounter (Signed)
Medication management - Telephone call with patient to inform of Dr. Joycelyn Schmid recommendation for patient to try Abilify 2 mg, one a day in conjunction with Remeron.  Reviewed all potential risks and benefits of medication and potential side effects.  Patient stated understanding plan and reported understanding all risks.  New one time order e-scribed to patient's [facility] on [address] in [place].  Patient agreed to follow-up with Dr. Shea Evans upon her return from vacation and to keep appointment on 05/14/18.  Patient to also call the [place] office back if any problems with medication once started and agreed with plan.

## 2018-04-23 NOTE — Telephone Encounter (Signed)
I looked over his chart, he would be appropriate to start a low dose of Abilify for augmentation of remeron - can start at 2 mg daily, and should make sure he is aware of the risks of akathisia, dystonia, metabolic effects with increasing doses of the medication.  Looks like he will see Dr. Shea Evans in a couple weeks and perhaps they can go to 5 mg at that time if he is having some benefits

## 2018-04-27 DIAGNOSIS — R0789 Other chest pain: Secondary | ICD-10-CM | POA: Insufficient documentation

## 2018-04-29 ENCOUNTER — Telehealth: Payer: Self-pay

## 2018-04-29 NOTE — Telephone Encounter (Signed)
I have reviewed patient's chart. Dr. Shea Evans had increased patient's dose of Remeron to 30 mg on 03/15/2018 which does not seem to have helped patient. He was recently started on Abilify 2 mg by Dr. Tory Emerald after he complained of not having any relief from anxiety and mood lability. He has called back reporting that this has not been helpful. Given multiple medication changes recently I will not be changing any of his medications and recommended that he go to the ER if he is not safe to other people or has suicidal thoughts. He can also approach the [facility] office for IOP as suggested by Dr. Shea Evans.

## 2018-04-29 NOTE — Telephone Encounter (Signed)
ARIPiprazole (ABILIFY) 2 MG tablet  Medication  Date: 04/23/2018 Department: [facility] Ordering/Authorizing: Aundra Dubin, MD  Order Providers   Prescribing Provider Encounter Provider  Daron Offer, Richard Miu, MD Watt Climes, RN  Outpatient Medication Detail    Disp Refills Start End   ARIPiprazole (ABILIFY) 2 MG tablet 30 tablet 0 04/23/2018    Sig - Route: Take 1 tablet (2 mg total) by mouth daily. - Oral   Sent to pharmacy as: ARIPiprazole (ABILIFY) 2 MG tablet   E-Prescribing Status: Receipt confirmed by pharmacy (04/23/2018 12:44 PM EDT)    Dr. Shea Evans patient.   - pt called states that the he been on the abilify 1-2 weeks now and is telling no differences.  States it is not working.

## 2018-05-05 ENCOUNTER — Ambulatory Visit: Payer: BLUE CROSS/BLUE SHIELD | Admitting: Licensed Clinical Social Worker

## 2018-05-14 ENCOUNTER — Ambulatory Visit: Payer: BLUE CROSS/BLUE SHIELD | Admitting: Psychiatry

## 2018-05-25 ENCOUNTER — Telehealth: Payer: Self-pay

## 2018-05-25 DIAGNOSIS — F330 Major depressive disorder, recurrent, mild: Secondary | ICD-10-CM

## 2018-05-25 MED ORDER — ARIPIPRAZOLE 2 MG PO TABS: 2.0000 mg | ORAL_TABLET | Freq: Every day | ORAL | 1 refills | Status: DC

## 2018-05-25 NOTE — Telephone Encounter (Signed)
Sent Abilify to pharmacy 

## 2018-05-25 NOTE — Telephone Encounter (Signed)
pt called states that he needs a refill on his medication the abilify.   ARIPiprazole (ABILIFY) 2 MG tablet  Medication  Date: 04/23/2018 Department: [facility] Ordering/Authorizing: Aundra Dubin, MD  Order Providers   Prescribing Provider Encounter Provider  Daron Offer, Richard Miu, MD Watt Climes, RN  Outpatient Medication Detail    Disp Refills Start End   ARIPiprazole (ABILIFY) 2 MG tablet 30 tablet 0 04/23/2018    Sig - Route: Take 1 tablet (2 mg total) by mouth daily. - Oral   Sent to pharmacy as: ARIPiprazole (ABILIFY) 2 MG tablet   E-Prescribing Status: Receipt confirmed by pharmacy (04/23/2018 12:44 PM EDT)

## 2018-05-26 ENCOUNTER — Ambulatory Visit: Payer: BLUE CROSS/BLUE SHIELD | Admitting: Psychiatry

## 2018-06-01 ENCOUNTER — Other Ambulatory Visit: Payer: Self-pay

## 2018-06-01 ENCOUNTER — Ambulatory Visit (INDEPENDENT_AMBULATORY_CARE_PROVIDER_SITE_OTHER): Payer: BLUE CROSS/BLUE SHIELD | Admitting: Psychiatry

## 2018-06-01 ENCOUNTER — Encounter: Payer: Self-pay | Admitting: Psychiatry

## 2018-06-01 VITALS — BP 158/100 | HR 83 | Wt 196.7 lb

## 2018-06-01 DIAGNOSIS — F1290 Cannabis use, unspecified, uncomplicated: Secondary | ICD-10-CM | POA: Diagnosis not present

## 2018-06-01 DIAGNOSIS — F331 Major depressive disorder, recurrent, moderate: Secondary | ICD-10-CM

## 2018-06-01 DIAGNOSIS — F17200 Nicotine dependence, unspecified, uncomplicated: Secondary | ICD-10-CM | POA: Diagnosis not present

## 2018-06-01 DIAGNOSIS — F5104 Psychophysiologic insomnia: Secondary | ICD-10-CM

## 2018-06-01 DIAGNOSIS — F411 Generalized anxiety disorder: Secondary | ICD-10-CM | POA: Diagnosis not present

## 2018-06-01 MED ORDER — DULOXETINE HCL 30 MG PO CPEP: 30.0000 mg | ORAL_CAPSULE | Freq: Two times a day (BID) | ORAL | 1 refills | Status: DC

## 2018-06-01 MED ORDER — MIRTAZAPINE 30 MG PO TABS: 30.0000 mg | ORAL_TABLET | Freq: Every day | ORAL | 2 refills | Status: DC

## 2018-06-01 MED ORDER — QUETIAPINE FUMARATE 25 MG PO TABS: 25.0000 mg | ORAL_TABLET | Freq: Every day | ORAL | 1 refills | Status: DC

## 2018-06-01 MED ORDER — HYDROXYZINE PAMOATE 25 MG PO CAPS: 25.0000 mg | ORAL_CAPSULE | Freq: Two times a day (BID) | ORAL | 2 refills | Status: DC | PRN

## 2018-06-01 MED ORDER — QUETIAPINE FUMARATE 25 MG PO TABS: 25.0000 mg | ORAL_TABLET | Freq: Every day | ORAL | 1 refills | Status: DC | PRN
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
## 2018-06-01 NOTE — Patient Instructions (Signed)
Quetiapine tablets What is this medicine? QUETIAPINE (kwe TYE a peen) is an antipsychotic. It is used to treat schizophrenia and bipolar disorder, also known as manic-depression. This medicine may be used for other purposes; ask your health care provider or pharmacist if you have questions. COMMON BRAND NAME(S): Seroquel What should I tell my health care provider before I take this medicine? They need to know if you have any of these conditions: -brain tumor or head injury -breast cancer -cataracts -diabetes -difficulty swallowing -heart disease -kidney disease -liver disease -low blood counts, like low white cell, platelet, or red cell counts -low blood pressure or dizziness when standing up -Parkinson's disease -previous heart attack -seizures -suicidal thoughts, plans, or attempt by you or a family member -thyroid disease -an unusual or allergic reaction to quetiapine, other medicines, foods, dyes, or preservatives -pregnant or trying to get pregnant -breast-feeding How should I use this medicine? Take this medicine by mouth. Swallow it with a drink of water. Follow the directions on the prescription label. If it upsets your stomach you can take it with food. Take your medicine at regular intervals. Do not take it more often than directed. Do not stop taking except on the advice of your doctor or health care professional. A special MedGuide will be given to you by the pharmacist with each prescription and refill. Be sure to read this information carefully each time. Talk to your pediatrician regarding the use of this medicine in children. While this drug may be prescribed for children as young as 10 years for selected conditions, precautions do apply. Patients over age 65 years may have a stronger reaction to this medicine and need smaller doses. Overdosage: If you think you have taken too much of this medicine contact a poison control center or emergency room at once. NOTE: This  medicine is only for you. Do not share this medicine with others. What if I miss a dose? If you miss a dose, take it as soon as you can. If it is almost time for your next dose, take only that dose. Do not take double or extra doses. What may interact with this medicine? Do not take this medicine with any of the following medications: -certain medicines for fungal infections like fluconazole, itraconazole, ketoconazole, posaconazole, voriconazole -cisapride -dofetilide -dronedarone -droperidol -grepafloxacin -halofantrine -phenothiazines like chlorpromazine, mesoridazine, thioridazine -pimozide -sparfloxacin -ziprasidone This medicine may also interact with the following medications: -alcohol -antiviral medicines for HIV or AIDS -certain medicines for blood pressure -certain medicines for depression, anxiety, or psychotic disturbances like haloperidol, lorazepam -certain medicines for diabetes -certain medicines for Parkinson's disease -certain medicines for seizures like carbamazepine, phenobarbital, phenytoin -cimetidine -erythromycin -other medicines that prolong the QT interval (cause an abnormal heart rhythm) -rifampin -steroid medicines like prednisone or cortisone This list may not describe all possible interactions. Give your health care provider a list of all the medicines, herbs, non-prescription drugs, or dietary supplements you use. Also tell them if you smoke, drink alcohol, or use illegal drugs. Some items may interact with your medicine. What should I watch for while using this medicine? Visit your doctor or health care professional for regular checks on your progress. It may be several weeks before you see the full effects of this medicine. Your health care provider may suggest that you have your eyes examined prior to starting this medicine, and every 6 months thereafter. If you have been taking this medicine regularly for some time, do not suddenly stop taking it.  You must gradually   reduce the dose or your symptoms may get worse. Ask your doctor or health care professional for advice. Patients and their families should watch out for worsening depression or thoughts of suicide. Also watch out for sudden or severe changes in feelings such as feeling anxious, agitated, panicky, irritable, hostile, aggressive, impulsive, severely restless, overly excited and hyperactive, or not being able to sleep. If this happens, especially at the beginning of antidepressant treatment or after a change in dose, call your health care professional. You may get dizzy or drowsy. Do not drive, use machinery, or do anything that needs mental alertness until you know how this medicine affects you. Do not stand or sit up quickly, especially if you are an older patient. This reduces the risk of dizzy or fainting spells. Alcohol can increase dizziness and drowsiness. Avoid alcoholic drinks. Do not treat yourself for colds, diarrhea or allergies. Ask your doctor or health care professional for advice, some ingredients may increase possible side effects. This medicine can reduce the response of your body to heat or cold. Dress warm in cold weather and stay hydrated in hot weather. If possible, avoid extreme temperatures like saunas, hot tubs, very hot or cold showers, or activities that can cause dehydration such as vigorous exercise. What side effects may I notice from receiving this medicine? Side effects that you should report to your doctor or health care professional as soon as possible: -allergic reactions like skin rash, itching or hives, swelling of the face, lips, or tongue -difficulty swallowing -fast or irregular heartbeat -fever or chills, sore throat -fever with rash, swollen lymph nodes, or swelling of the face -increased hunger or thirst -increased urination -problems with balance, talking, walking -seizures -stiff muscles -suicidal thoughts or other mood  changes -uncontrollable head, mouth, neck, arm, or leg movements -unusually weak or tired Side effects that usually do not require medical attention (report to your doctor or health care professional if they continue or are bothersome): -change in sex drive or performance -constipation -drowsy or dizzy -dry mouth -stomach upset -weight gain This list may not describe all possible side effects. Call your doctor for medical advice about side effects. You may report side effects to FDA at 1-800-FDA-1088. Where should I keep my medicine? Keep out of the reach of children. Store at room temperature between 15 and 30 degrees C (59 and 86 degrees F). Throw away any unused medicine after the expiration date. NOTE: This sheet is a summary. It may not cover all possible information. If you have questions about this medicine, talk to your doctor, pharmacist, or health care provider.  2018 Elsevier/Gold Standard (2015-03-27 13:07:35)  

## 2018-06-08 ENCOUNTER — Ambulatory Visit (INDEPENDENT_AMBULATORY_CARE_PROVIDER_SITE_OTHER): Payer: BLUE CROSS/BLUE SHIELD | Admitting: Licensed Clinical Social Worker

## 2018-06-08 DIAGNOSIS — F411 Generalized anxiety disorder: Secondary | ICD-10-CM

## 2018-06-08 DIAGNOSIS — F331 Major depressive disorder, recurrent, moderate: Secondary | ICD-10-CM

## 2018-06-29 ENCOUNTER — Ambulatory Visit: Payer: BLUE CROSS/BLUE SHIELD | Admitting: Psychiatry

## 2018-06-30 NOTE — Progress Notes (Signed)
   THERAPIST PROGRESS NOTE  Session Time: 42mn  Participation Level: Active  Behavioral Response: CasualAlertAnxious  Type of Therapy: Individual Therapy  Treatment Goals addressed: Anxiety  Interventions: CBT  Summary: Anthony FARVEis a 62y.o. male who presents with symptoms of his diagnosis.  Therapist met with Patient in an outpatient setting to assess current mood and assist with making progress towards goals through the use of therapeutic intervention. Therapist did a brief mood check, assessing anger, fear, disgust, excitement, happiness, and sadness.  Patient reports that he is "angry" and it seems to not go away.  Patient becomes frustrated easily about his oldest 2 adult sons.  Patient reports that he wants his sons to be drug free and live productive lives.  Explored how he can relax and assist with guiding his children but not avoid taking on their struggles. Completed Action, Emotions, Behavior CBT technique with Patient.    Suicidal/Homicidal: No  Plan: Return again in2 weeks.  Diagnosis: Axis I: Generalized Anxiety Disorder and Depression    Axis II: No diagnosis    NLubertha South LCSW 06/08/2018

## 2018-07-08 ENCOUNTER — Ambulatory Visit: Payer: BLUE CROSS/BLUE SHIELD | Admitting: Licensed Clinical Social Worker

## 2019-04-14 ENCOUNTER — Other Ambulatory Visit: Payer: Self-pay | Admitting: Specialist

## 2019-04-14 DIAGNOSIS — R0609 Other forms of dyspnea: Secondary | ICD-10-CM

## 2019-04-14 DIAGNOSIS — J439 Emphysema, unspecified: Secondary | ICD-10-CM

## 2019-04-21 ENCOUNTER — Telehealth: Payer: Self-pay | Admitting: *Deleted

## 2019-04-21 ENCOUNTER — Encounter: Payer: Self-pay | Admitting: *Deleted

## 2019-04-21 DIAGNOSIS — Z122 Encounter for screening for malignant neoplasm of respiratory organs: Secondary | ICD-10-CM

## 2019-04-21 DIAGNOSIS — Z87891 Personal history of nicotine dependence: Secondary | ICD-10-CM

## 2019-04-21 NOTE — Telephone Encounter (Signed)
Received referral for initial lung cancer screening scan. Contacted patient and obtained smoking history,(current, 88.5 pack year) as well as answering questions related to screening process. Patient denies signs of lung cancer such as weight loss or hemoptysis. Patient denies comorbidity that would prevent curative treatment if lung cancer were found. Patient is scheduled for shared decision making visit and CT scan on 04/25/19 at 215pm.

## 2019-04-25 ENCOUNTER — Other Ambulatory Visit: Payer: Self-pay

## 2019-04-25 ENCOUNTER — Inpatient Hospital Stay: Payer: Managed Care, Other (non HMO) | Attending: Nurse Practitioner | Admitting: Nurse Practitioner

## 2019-04-25 ENCOUNTER — Ambulatory Visit
Admission: RE | Admit: 2019-04-25 | Discharge: 2019-04-25 | Disposition: A | Discharge status: Home / Self Care | Payer: Managed Care, Other (non HMO) | Source: Ambulatory Visit | Attending: Nurse Practitioner | Admitting: Nurse Practitioner

## 2019-04-25 DIAGNOSIS — Z122 Encounter for screening for malignant neoplasm of respiratory organs: Secondary | ICD-10-CM | POA: Diagnosis not present

## 2019-04-25 DIAGNOSIS — Z87891 Personal history of nicotine dependence: Secondary | ICD-10-CM

## 2019-04-25 NOTE — Progress Notes (Signed)
Virtual Visit via Video Enabled Telemedicine Note   I connected with Anthony Bender on 04/25/19 at 2:00 PM EST by video enabled telemedicine visit and verified that I am speaking with the correct person using two identifiers.   I discussed the limitations, risks, security and privacy concerns of performing an evaluation and management service by telemedicine and the availability of in-person appointments. I also discussed with the patient that there may be a patient responsible charge related to this service. The patient expressed understanding and agreed to proceed.   Other persons participating in the visit and their role in the encounter: Burgess Estelle, RN- checking in patient & navigation  Patient's location: clinic  Provider's location: home  Chief Complaint: Low Dose CT Screening  Patient agreed to evaluation by telemedicine to discuss shared decision making for consideration of low dose CT lung cancer screening.    In accordance with CMS guidelines, patient has met eligibility criteria including age, absence of signs or symptoms of lung cancer.  Social History   Tobacco Use  . Smoking status: Current Every Day Smoker    Packs/day: 1.50    Years: 59.00    Pack years: 88.50    Types: Cigarettes  . Smokeless tobacco: Never Used  Substance Use Topics  . Alcohol use: Not Currently    Comment: socail     A shared decision-making session was conducted prior to the performance of CT scan. This includes one or more decision aids, includes benefits and harms of screening, follow-up diagnostic testing, over-diagnosis, false positive rate, and total radiation exposure.   Counseling on the importance of adherence to annual lung cancer LDCT screening, impact of co-morbidities, and ability or willingness to undergo diagnosis and treatment is imperative for compliance of the program.   Counseling on the importance of continued smoking cessation for former smokers; the importance of  smoking cessation for current smokers, and information about tobacco cessation interventions have been given to patient including [place] and [employer] [address] programs.   Written order for lung cancer screening with LDCT has been given to the patient and any and all questions have been answered to the best of my abilities.    Yearly follow up will be coordinated by Burgess Estelle, Thoracic Navigator.  I discussed the assessment and treatment plan with the patient. The patient was provided an opportunity to ask questions and all were answered. The patient agreed with the plan and demonstrated an understanding of the instructions.   The patient was advised to call back or seek an in-person evaluation if the symptoms worsen or if the condition fails to improve as anticipated.   I provided 15 minutes of face-to-face video visit time during this encounter, and > 50% was spent counseling as documented under my assessment & plan.   Beckey Rutter, DNP, AGNP-C [place] [facility] [phone] (work cell) [phone] (office)

## 2019-04-26 ENCOUNTER — Encounter: Payer: Self-pay | Admitting: *Deleted

## 2020-04-20 ENCOUNTER — Telehealth: Payer: Self-pay

## 2020-04-20 NOTE — Telephone Encounter (Signed)
Contacted patient to schedule his annual lung CT screening scan.  I left message for patient to call Burgess Estelle, lung navigator to schedule scan appointment.

## 2020-04-25 ENCOUNTER — Telehealth: Payer: Self-pay

## 2020-04-25 NOTE — Telephone Encounter (Signed)
Contacted patient to schedule his annual lung screening CT scan. Message left for patient to call Burgess Estelle, lung navigator to schedule.

## 2020-04-27 ENCOUNTER — Telehealth: Payer: Self-pay | Admitting: *Deleted

## 2020-04-27 NOTE — Telephone Encounter (Signed)
(  04/27/2020) Left message for pt to notify them that it is time to schedule annual low dose lung cancer screening CT scan. Instructed patient to call back to verify information prior to the scan being scheduled °SRW °  ° ° °

## 2020-06-09 ENCOUNTER — Other Ambulatory Visit: Payer: Self-pay

## 2020-06-09 ENCOUNTER — Encounter: Payer: Self-pay | Admitting: Emergency Medicine

## 2020-06-09 ENCOUNTER — Emergency Department: Payer: Managed Care, Other (non HMO)

## 2020-06-09 DIAGNOSIS — M79622 Pain in left upper arm: Secondary | ICD-10-CM | POA: Diagnosis present

## 2020-06-09 DIAGNOSIS — Z5321 Procedure and treatment not carried out due to patient leaving prior to being seen by health care provider: Secondary | ICD-10-CM | POA: Diagnosis not present

## 2020-06-09 DIAGNOSIS — M7989 Other specified soft tissue disorders: Secondary | ICD-10-CM | POA: Diagnosis not present

## 2020-06-09 LAB — CBC WITH DIFFERENTIAL/PLATELET
Abs Immature Granulocytes: 0.02 10*3/uL (ref 0.00–0.07)
Basophils Absolute: 0.1 10*3/uL (ref 0.0–0.1)
Basophils Relative: 1 %
Eosinophils Absolute: 0.2 10*3/uL (ref 0.0–0.5)
Eosinophils Relative: 2 %
HCT: 47 % (ref 39.0–52.0)
Hemoglobin: 16.7 g/dL (ref 13.0–17.0)
Immature Granulocytes: 0 %
Lymphocytes Relative: 23 %
Lymphs Abs: 1.7 10*3/uL (ref 0.7–4.0)
MCH: 34.6 pg — ABNORMAL HIGH (ref 26.0–34.0)
MCHC: 35.5 g/dL (ref 30.0–36.0)
MCV: 97.5 fL (ref 80.0–100.0)
Monocytes Absolute: 0.8 10*3/uL (ref 0.1–1.0)
Monocytes Relative: 11 %
Neutro Abs: 4.7 10*3/uL (ref 1.7–7.7)
Neutrophils Relative %: 63 %
Platelets: 201 10*3/uL (ref 150–400)
RBC: 4.82 MIL/uL (ref 4.22–5.81)
RDW: 13.5 % (ref 11.5–15.5)
WBC: 7.5 10*3/uL (ref 4.0–10.5)
nRBC: 0 % (ref 0.0–0.2)

## 2020-06-09 LAB — COMPREHENSIVE METABOLIC PANEL
ALT: 18 U/L (ref 0–44)
AST: 24 U/L (ref 15–41)
Albumin: 4.3 g/dL (ref 3.5–5.0)
Alkaline Phosphatase: 83 U/L (ref 38–126)
Anion gap: 7 (ref 5–15)
BUN: 10 mg/dL (ref 8–23)
CO2: 29 mmol/L (ref 22–32)
Calcium: 9.1 mg/dL (ref 8.9–10.3)
Chloride: 101 mmol/L (ref 98–111)
Creatinine, Ser: 0.9 mg/dL (ref 0.61–1.24)
GFR calc Af Amer: >60 mL/min (ref >60)
GFR calc non Af Amer: >60 mL/min (ref >60)
Glucose, Bld: 92 mg/dL (ref 70–99)
Potassium: 4.3 mmol/L (ref 3.5–5.1)
Sodium: 137 mmol/L (ref 135–145)
Total Bilirubin: 1 mg/dL (ref 0.3–1.2)
Total Protein: 7.7 g/dL (ref 6.5–8.1)

## 2020-06-09 NOTE — ED Triage Notes (Cosign Needed)
Emergency Medicine Provider Triage Evaluation Note  Anthony Bender , a 64 y.o. male  was evaluated in triage.  Pt complains of left upper arm pain radiating into the axilla.  Patient states that he has had pain, erythema, edema to the left biceps region that extends into the axilla x2 days.  Symptoms began yesterday and worsened today.  Patient denies any injury to the area.  No history of DVT.  Patient states that is a deep aching type pain.  No fevers or chills.  No URI symptoms.  No chest pain or shortness of breath.  No medications for his complaint prior to arrival.  No history of recurring skin infections..  Review of Systems  Positive: Pain, erythema, edema to the left biceps region extending into the left axilla Negative: No fevers or chills, URI symptoms, numbness and tingling of the extremity, loss of range of movement,  Physical Exam  BP (!) 138/92   Pulse 82   Temp 97.8 F (36.6 C) (Oral)   Resp 16   Ht 5\' 11"  (1.803 m)   Wt 88.5 kg   SpO2 97%   BMI 27.20 kg/m  Gen:   Awake, no distress   HEENT:  Atraumatic  Resp:  Normal effort  Cardiac:  Normal rate and rhythm Abd:   Nondistended, nontender  MSK:   Moves extremities without difficulty, findings of the left biceps region reveals no deformity.  No Popeye deformity.  Patient with erythema, edema of the anterior upper arm extending into the left axilla.  No fluctuance or induration.  No purulent drainage.  Area is tender to palpation. Neuro:  Speech clear   Medical Decision Making  Medically screening exam initiated at 5:31 PM.  Appropriate orders placed.  ARMAND PREAST was informed that the remainder of the evaluation will be completed by another provider, this initial triage assessment does not replace that evaluation, and the importance of remaining in the ED until their evaluation is complete.  Clinical Impression  Left upper extremity pain.  Patient presented to emergency department complaining of pain, swelling,  erythema to the left upper extremity.  Findings on physical exam were concerning for cellulitis versus DVT.  On exam no evidence of superficial abscess.  Given the differential patient will have labs, ultrasound of the left upper extremity.  Patient is informed that this is a medical screening exam and that patient will likely be seen, diagnosed, treated by another provider.   Darletta Moll, PA-C 06/09/20 1731

## 2020-06-09 NOTE — ED Triage Notes (Signed)
Pt here for pain to left upper arm.  Pt has redness and swelling to bicep area that tracks upward.  Pain also in axilla. Denies fever. Warm to touch. Pain with palpation.

## 2020-06-10 ENCOUNTER — Emergency Department
Admission: EM | Admit: 2020-06-10 | Discharge: 2020-06-10 | Disposition: A | Discharge status: Home / Self Care | Payer: Managed Care, Other (non HMO) | Attending: Emergency Medicine | Admitting: Emergency Medicine

## 2020-06-10 NOTE — ED Notes (Signed)
Called for room no answer

## 2020-09-14 ENCOUNTER — Other Ambulatory Visit (HOSPITAL_COMMUNITY): Payer: Self-pay | Admitting: Orthopedic Surgery

## 2020-09-14 ENCOUNTER — Other Ambulatory Visit: Payer: Self-pay | Admitting: Orthopedic Surgery

## 2020-09-14 DIAGNOSIS — M25312 Other instability, left shoulder: Secondary | ICD-10-CM

## 2020-09-14 DIAGNOSIS — M19012 Primary osteoarthritis, left shoulder: Secondary | ICD-10-CM

## 2020-09-17 ENCOUNTER — Encounter: Payer: Self-pay | Admitting: *Deleted

## 2020-10-08 ENCOUNTER — Ambulatory Visit
Admission: RE | Admit: 2020-10-08 | Discharge: 2020-10-08 | Disposition: A | Discharge status: Home / Self Care | Payer: Managed Care, Other (non HMO) | Source: Ambulatory Visit | Attending: Orthopedic Surgery | Admitting: Orthopedic Surgery

## 2020-10-08 ENCOUNTER — Other Ambulatory Visit: Payer: Self-pay

## 2020-10-08 DIAGNOSIS — M19012 Primary osteoarthritis, left shoulder: Secondary | ICD-10-CM | POA: Diagnosis present

## 2020-10-08 DIAGNOSIS — M25312 Other instability, left shoulder: Secondary | ICD-10-CM | POA: Insufficient documentation

## 2021-06-05 IMAGING — CT CT CHEST LUNG CANCER SCREENING LOW DOSE
2 of 5 series · 15 of 40 positions shown, 18 images · non-contrast
Comparison: No comparison studies available.

CLINICAL DATA: 63-year-old male with 89 pack year history of
smoking. Lung cancer screening.

EXAM:
CT CHEST WITHOUT CONTRAST LOW-DOSE FOR LUNG CANCER SCREENING
TECHNIQUE: Multidetector CT imaging of the chest was performed following the
standard protocol without IV contrast.

[Series 3: thins lung · axial · 0.68mm/px · z∈[-1228,-915]mm · 12 of 345 slices shown, 15 images]
[im 16/345  mediastinal]
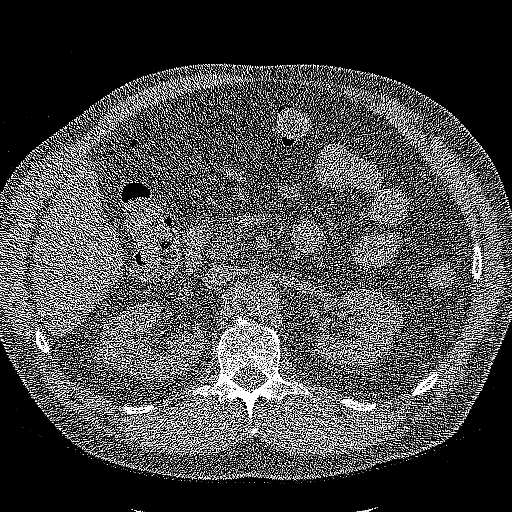
[im 16/345  lung]
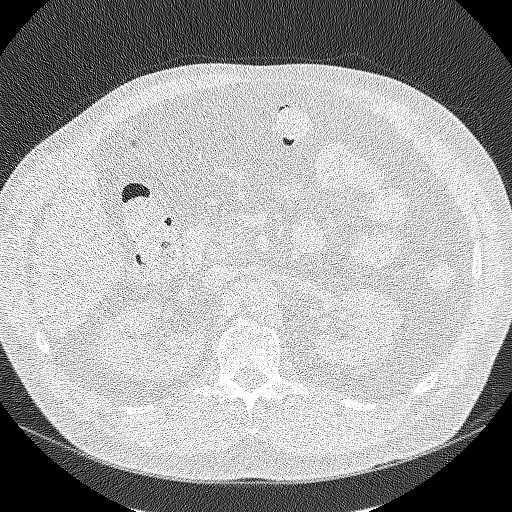
[im 47/345  lung]
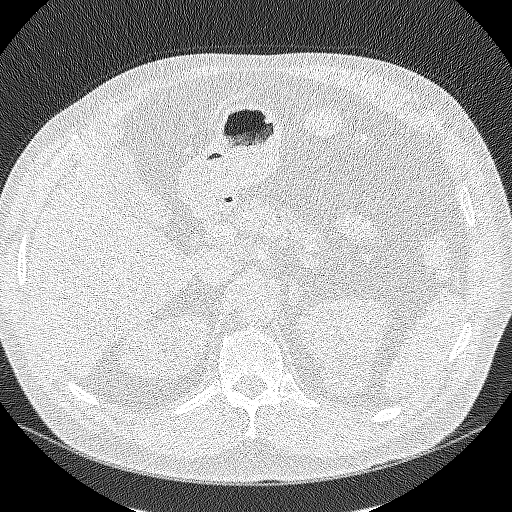
[im 94/345  lung]
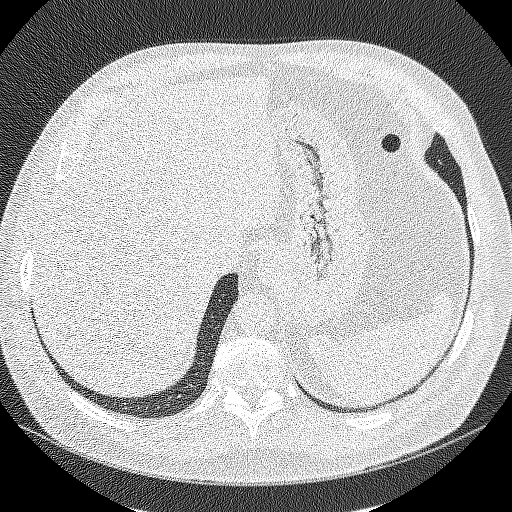
[im 110/345  lung]
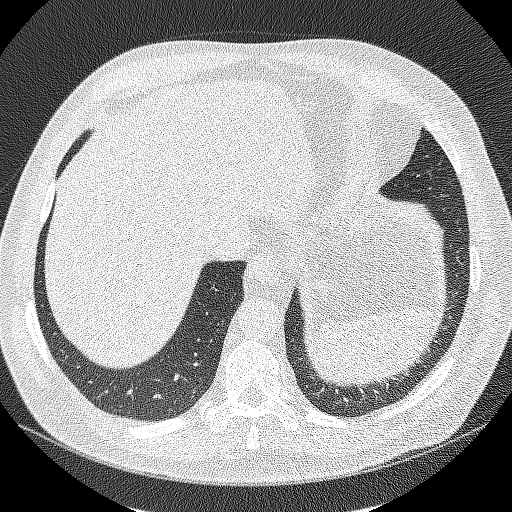
[im 141/345  mediastinal]
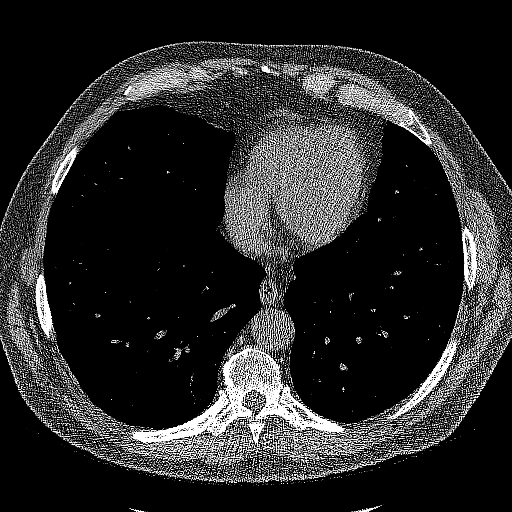
[im 141/345  lung]
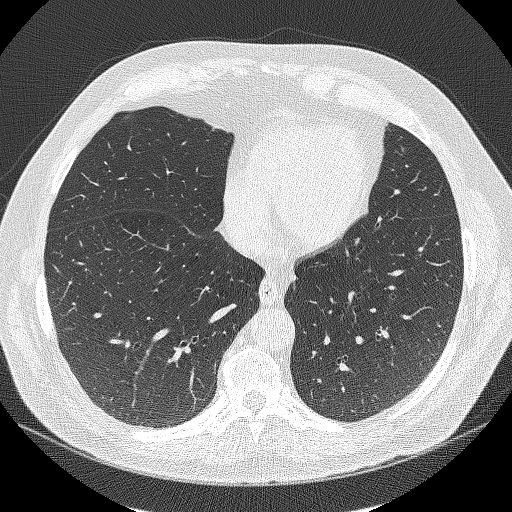
[im 173/345  lung]
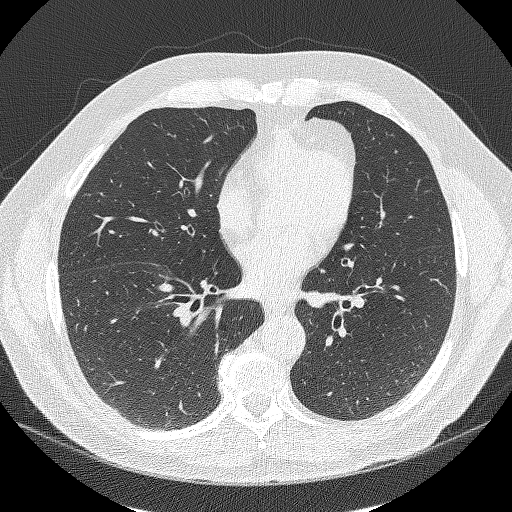
[im 188/345  lung]
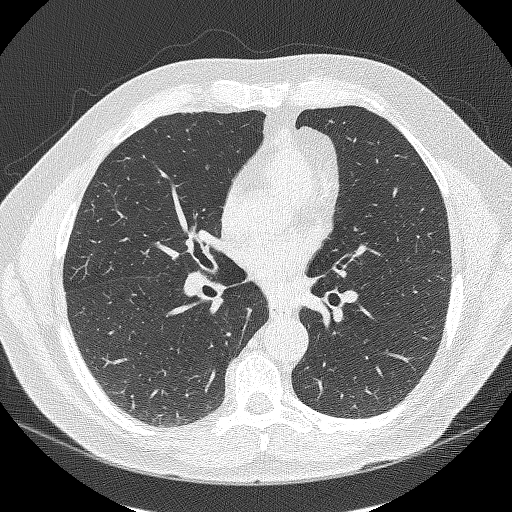
[im 219/345  lung]
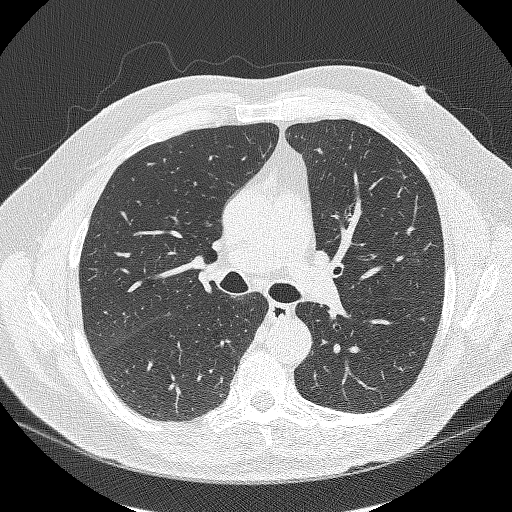
[im 251/345  mediastinal]
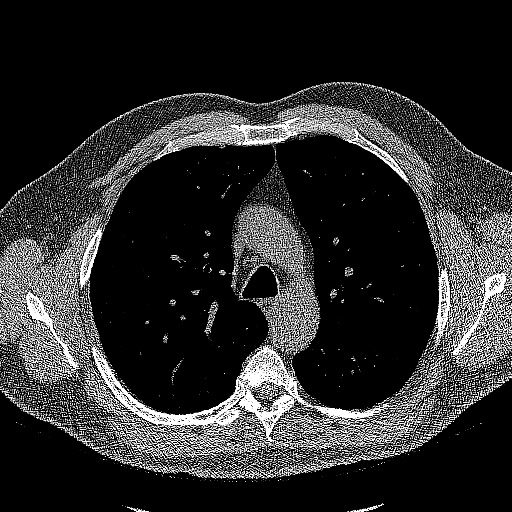
[im 251/345  lung]
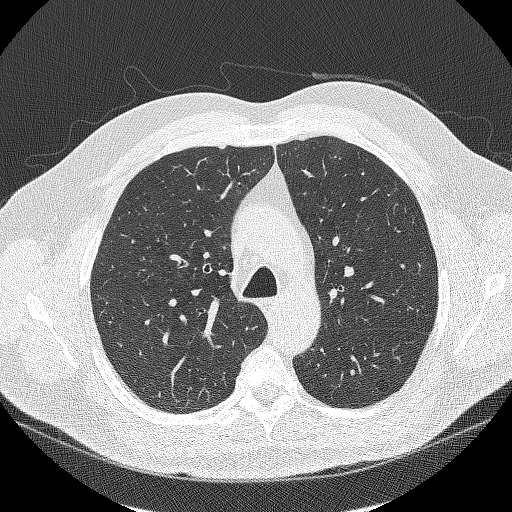
[im 266/345  lung]
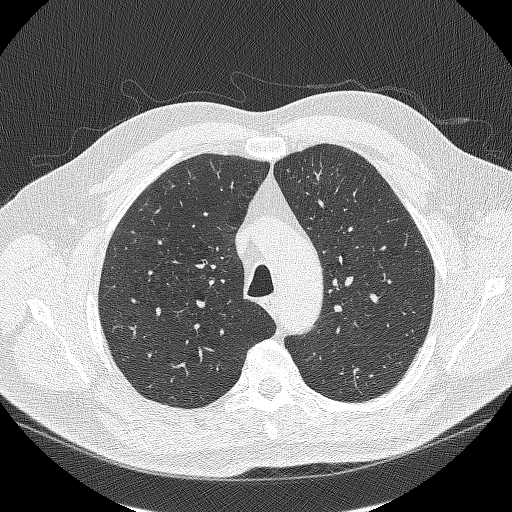
[im 298/345  lung]
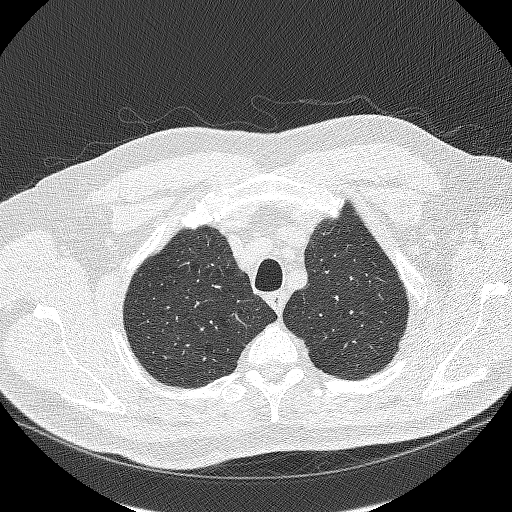
[im 329/345  lung]
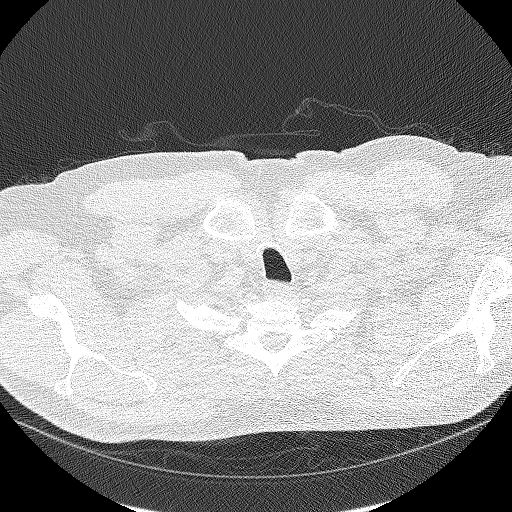

[Series 4: coronal lung · coronal · 0.67mm/px · 3 of 300 slices shown]
[im 60/300  lung]
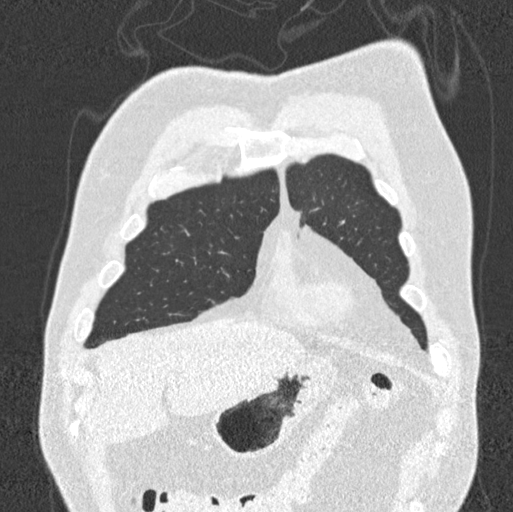
[im 120/300  lung]
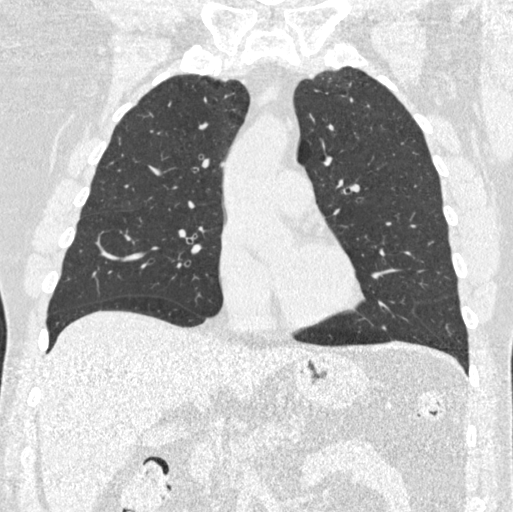
[im 180/300  lung]
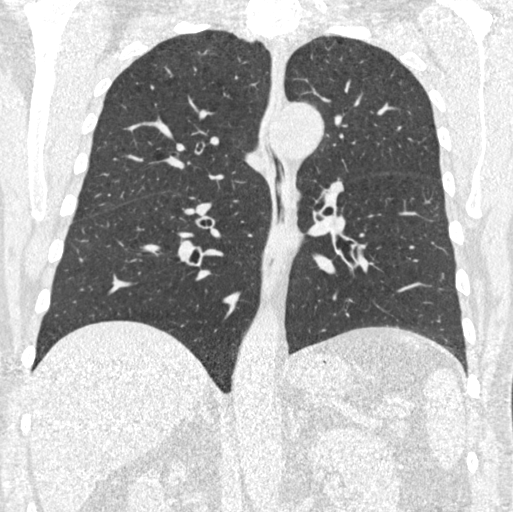

[15 of 40 positions shown; findings below may reference images not displayed]

FINDINGS: Cardiovascular: The heart size is normal. No substantial pericardial
effusion. Coronary artery calcification is evident. No thoracic
aortic aneurysm.

Mediastinum/Nodes: No mediastinal lymphadenopathy. No evidence for
gross hilar lymphadenopathy although assessment is limited by the
lack of intravenous contrast on today's study. The esophagus has
normal imaging features. There is no axillary lymphadenopathy.

Lungs/Pleura: Centrilobular emphsyema noted. No suspicious pulmonary
nodule or mass. No focal consolidation. No pleural effusion.

Upper Abdomen: 2.8 cm low-density lesion in the left kidney
approaches water attenuation and is likely a cyst.

Musculoskeletal: No worrisome lytic or sclerotic osseous
abnormality.
IMPRESSION: 1. Lung-RADS 1, negative. Continue annual screening with low-dose
chest CT without contrast in 12 months.
2.  Emphysema. ([id number])

## 2022-04-09 ENCOUNTER — Telehealth: Payer: Self-pay | Admitting: *Deleted

## 2022-04-09 NOTE — Telephone Encounter (Signed)
Left message for patient to call back to schedule follow up lung cancer screening CT scan.  

## 2022-06-10 ENCOUNTER — Telehealth: Payer: Self-pay | Admitting: *Deleted

## 2022-06-10 NOTE — Telephone Encounter (Signed)
Left message at both home and mobile number to have pt call back to schedule yearly lung screening CT scan.

## 2022-09-05 ENCOUNTER — Observation Stay
Admission: EM | Admit: 2022-09-05 | Discharge: 2022-09-06 | Disposition: A | Discharge status: Home / Self Care | Payer: No Typology Code available for payment source | Attending: Internal Medicine | Admitting: Internal Medicine

## 2022-09-05 ENCOUNTER — Encounter: Payer: Self-pay | Admitting: Emergency Medicine

## 2022-09-05 ENCOUNTER — Other Ambulatory Visit: Payer: Self-pay

## 2022-09-05 DIAGNOSIS — Z87891 Personal history of nicotine dependence: Secondary | ICD-10-CM

## 2022-09-05 DIAGNOSIS — F5104 Psychophysiologic insomnia: Secondary | ICD-10-CM | POA: Diagnosis present

## 2022-09-05 DIAGNOSIS — X58XXXA Exposure to other specified factors, initial encounter: Secondary | ICD-10-CM | POA: Diagnosis not present

## 2022-09-05 DIAGNOSIS — M86172 Other acute osteomyelitis, left ankle and foot: Secondary | ICD-10-CM | POA: Diagnosis present

## 2022-09-05 DIAGNOSIS — L97529 Non-pressure chronic ulcer of other part of left foot with unspecified severity: Secondary | ICD-10-CM

## 2022-09-05 DIAGNOSIS — Z79899 Other long term (current) drug therapy: Secondary | ICD-10-CM | POA: Diagnosis not present

## 2022-09-05 DIAGNOSIS — I10 Essential (primary) hypertension: Secondary | ICD-10-CM | POA: Diagnosis present

## 2022-09-05 DIAGNOSIS — S91105A Unspecified open wound of left lesser toe(s) without damage to nail, initial encounter: Secondary | ICD-10-CM | POA: Diagnosis not present

## 2022-09-05 DIAGNOSIS — J45909 Unspecified asthma, uncomplicated: Secondary | ICD-10-CM | POA: Insufficient documentation

## 2022-09-05 DIAGNOSIS — J449 Chronic obstructive pulmonary disease, unspecified: Secondary | ICD-10-CM | POA: Diagnosis not present

## 2022-09-05 DIAGNOSIS — Z96652 Presence of left artificial knee joint: Secondary | ICD-10-CM | POA: Diagnosis not present

## 2022-09-05 DIAGNOSIS — L03116 Cellulitis of left lower limb: Secondary | ICD-10-CM

## 2022-09-05 DIAGNOSIS — F17210 Nicotine dependence, cigarettes, uncomplicated: Secondary | ICD-10-CM | POA: Insufficient documentation

## 2022-09-05 DIAGNOSIS — G609 Hereditary and idiopathic neuropathy, unspecified: Secondary | ICD-10-CM | POA: Diagnosis present

## 2022-09-05 DIAGNOSIS — Z7952 Long term (current) use of systemic steroids: Secondary | ICD-10-CM | POA: Diagnosis not present

## 2022-09-05 DIAGNOSIS — N400 Enlarged prostate without lower urinary tract symptoms: Secondary | ICD-10-CM | POA: Diagnosis present

## 2022-09-05 DIAGNOSIS — E785 Hyperlipidemia, unspecified: Secondary | ICD-10-CM | POA: Diagnosis present

## 2022-09-05 LAB — CBC WITH DIFFERENTIAL/PLATELET
Abs Immature Granulocytes: 0.03 10*3/uL (ref 0.00–0.07)
Basophils Absolute: 0.1 10*3/uL (ref 0.0–0.1)
Basophils Relative: 2 %
Eosinophils Absolute: 0.3 10*3/uL (ref 0.0–0.5)
Eosinophils Relative: 3 %
HCT: 48.4 % (ref 39.0–52.0)
Hemoglobin: 16 g/dL (ref 13.0–17.0)
Immature Granulocytes: 0 %
Lymphocytes Relative: 26 %
Lymphs Abs: 2 10*3/uL (ref 0.7–4.0)
MCH: 32.1 pg (ref 26.0–34.0)
MCHC: 33.1 g/dL (ref 30.0–36.0)
MCV: 97 fL (ref 80.0–100.0)
Monocytes Absolute: 0.8 10*3/uL (ref 0.1–1.0)
Monocytes Relative: 10 %
Neutro Abs: 4.7 10*3/uL (ref 1.7–7.7)
Neutrophils Relative %: 59 %
Platelets: 276 10*3/uL (ref 150–400)
RBC: 4.99 MIL/uL (ref 4.22–5.81)
RDW: 13.4 % (ref 11.5–15.5)
WBC: 8 10*3/uL (ref 4.0–10.5)
nRBC: 0 % (ref 0.0–0.2)

## 2022-09-05 LAB — COMPREHENSIVE METABOLIC PANEL
ALT: 22 U/L (ref 0–44)
AST: 25 U/L (ref 15–41)
Albumin: 3.7 g/dL (ref 3.5–5.0)
Alkaline Phosphatase: 83 U/L (ref 38–126)
Anion gap: 10 (ref 5–15)
BUN: 17 mg/dL (ref 8–23)
CO2: 24 mmol/L (ref 22–32)
Calcium: 9.3 mg/dL (ref 8.9–10.3)
Chloride: 102 mmol/L (ref 98–111)
Creatinine, Ser: 1 mg/dL (ref 0.61–1.24)
GFR, Estimated: >60 mL/min (ref >60)
Glucose, Bld: 103 mg/dL — ABNORMAL HIGH (ref 70–99)
Potassium: 4 mmol/L (ref 3.5–5.1)
Sodium: 136 mmol/L (ref 135–145)
Total Bilirubin: 0.8 mg/dL (ref 0.3–1.2)
Total Protein: 7.8 g/dL (ref 6.5–8.1)

## 2022-09-05 LAB — LACTIC ACID, PLASMA
Lactic Acid, Venous: 1.3 mmol/L (ref 0.5–1.9)
Lactic Acid, Venous: 1.5 mmol/L (ref 0.5–1.9)

## 2022-09-05 MED ORDER — HEPARIN SODIUM (PORCINE) 5000 UNIT/ML IJ SOLN
5000.0000 [IU] | Freq: Three times a day (TID) | INTRAMUSCULAR | Status: DC
  Administered 2022-09-05: 5000 [IU] via SUBCUTANEOUS
  Filled 2022-09-05: qty 1

## 2022-09-05 MED ORDER — VANCOMYCIN HCL 1750 MG/350ML IV SOLN
1750.0000 mg | Freq: Once | INTRAVENOUS | Status: AC
  Administered 2022-09-05: 1750 mg via INTRAVENOUS
  Filled 2022-09-05: qty 350

## 2022-09-05 MED ORDER — NICOTINE POLACRILEX 2 MG MT GUM: 2.0000 mg | CHEWING_GUM | OROMUCOSAL | Status: DC | PRN

## 2022-09-05 MED ORDER — ONDANSETRON HCL 4 MG/2ML IJ SOLN: 4.0000 mg | Freq: Four times a day (QID) | INTRAMUSCULAR | Status: DC | PRN

## 2022-09-05 MED ORDER — PREGABALIN 75 MG PO CAPS
150.0000 mg | ORAL_CAPSULE | Freq: Three times a day (TID) | ORAL | Status: DC
  Administered 2022-09-05 – 2022-09-06 (×4): 150 mg via ORAL
  Filled 2022-09-05: qty 3
  Filled 2022-09-05 (×3): qty 2

## 2022-09-05 MED ORDER — POVIDONE-IODINE 10 % EX SWAB
2.0000 | Freq: Once | CUTANEOUS | Status: AC
  Administered 2022-09-05: 2 via TOPICAL
  Filled 2022-09-05: qty 2

## 2022-09-05 MED ORDER — PIPERACILLIN-TAZOBACTAM 3.375 G IVPB 30 MIN: 3.3750 g | Freq: Once | INTRAVENOUS | Status: DC

## 2022-09-05 MED ORDER — ROPINIROLE HCL 1 MG PO TABS: 0.5000 mg | ORAL_TABLET | Freq: Four times a day (QID) | ORAL | Status: DC | PRN

## 2022-09-05 MED ORDER — NORTRIPTYLINE HCL 10 MG PO CAPS
20.0000 mg | ORAL_CAPSULE | Freq: Every day | ORAL | Status: DC
  Administered 2022-09-05: 20 mg via ORAL
  Filled 2022-09-05: qty 2

## 2022-09-05 MED ORDER — SUMATRIPTAN SUCCINATE 50 MG PO TABS: 25.0000 mg | ORAL_TABLET | ORAL | Status: DC | PRN

## 2022-09-05 MED ORDER — HYDRALAZINE HCL 20 MG/ML IJ SOLN: 5.0000 mg | Freq: Three times a day (TID) | INTRAMUSCULAR | Status: DC | PRN

## 2022-09-05 MED ORDER — DULOXETINE HCL 30 MG PO CPEP
30.0000 mg | ORAL_CAPSULE | Freq: Two times a day (BID) | ORAL | Status: DC
  Administered 2022-09-05 – 2022-09-06 (×2): 30 mg via ORAL
  Filled 2022-09-05 (×3): qty 1

## 2022-09-05 MED ORDER — UMECLIDINIUM BROMIDE 62.5 MCG/ACT IN AEPB
1.0000 | INHALATION_SPRAY | Freq: Every day | RESPIRATORY_TRACT | Status: DC
  Administered 2022-09-06: 1 via RESPIRATORY_TRACT
  Filled 2022-09-05: qty 7

## 2022-09-05 MED ORDER — ACETAMINOPHEN 650 MG RE SUPP: 650.0000 mg | Freq: Four times a day (QID) | RECTAL | Status: DC | PRN

## 2022-09-05 MED ORDER — PIPERACILLIN-TAZOBACTAM 3.375 G IVPB
3.3750 g | Freq: Three times a day (TID) | INTRAVENOUS | Status: DC
  Administered 2022-09-05 – 2022-09-06 (×4): 3.375 g via INTRAVENOUS
  Filled 2022-09-05 (×4): qty 50

## 2022-09-05 MED ORDER — ONDANSETRON HCL 4 MG PO TABS: 4.0000 mg | ORAL_TABLET | Freq: Four times a day (QID) | ORAL | Status: DC | PRN

## 2022-09-05 MED ORDER — PANTOPRAZOLE SODIUM 40 MG PO TBEC
40.0000 mg | DELAYED_RELEASE_TABLET | Freq: Two times a day (BID) | ORAL | Status: DC
  Administered 2022-09-05 – 2022-09-06 (×2): 40 mg via ORAL
  Filled 2022-09-05: qty 1
  Filled 2022-09-05 (×2): qty 2

## 2022-09-05 MED ORDER — VARENICLINE TARTRATE 0.5 MG PO TABS
1.0000 mg | ORAL_TABLET | Freq: Two times a day (BID) | ORAL | Status: DC
  Administered 2022-09-05 – 2022-09-06 (×2): 1 mg via ORAL
  Filled 2022-09-05 (×2): qty 2

## 2022-09-05 MED ORDER — ACETAMINOPHEN 325 MG PO TABS: 650.0000 mg | ORAL_TABLET | Freq: Four times a day (QID) | ORAL | Status: DC | PRN

## 2022-09-05 MED ORDER — HEPARIN SODIUM (PORCINE) 5000 UNIT/ML IJ SOLN
5000.0000 [IU] | Freq: Three times a day (TID) | INTRAMUSCULAR | Status: AC
  Administered 2022-09-05: 5000 [IU] via SUBCUTANEOUS
  Filled 2022-09-05: qty 1

## 2022-09-05 MED ORDER — IRBESARTAN 150 MG PO TABS
150.0000 mg | ORAL_TABLET | Freq: Every day | ORAL | Status: DC
  Administered 2022-09-05 – 2022-09-06 (×2): 150 mg via ORAL
  Filled 2022-09-05 (×2): qty 1

## 2022-09-05 MED ORDER — FLUTICASONE FUROATE-VILANTEROL 100-25 MCG/ACT IN AEPB
1.0000 | INHALATION_SPRAY | Freq: Every day | RESPIRATORY_TRACT | Status: DC
  Administered 2022-09-06: 1 via RESPIRATORY_TRACT
  Filled 2022-09-05: qty 28

## 2022-09-05 MED ORDER — ALBUTEROL SULFATE (2.5 MG/3ML) 0.083% IN NEBU: 3.0000 mL | INHALATION_SOLUTION | Freq: Four times a day (QID) | RESPIRATORY_TRACT | Status: DC | PRN

## 2022-09-05 MED ORDER — SENNOSIDES-DOCUSATE SODIUM 8.6-50 MG PO TABS: 1.0000 | ORAL_TABLET | Freq: Every evening | ORAL | Status: DC | PRN

## 2022-09-05 MED ORDER — ZOLPIDEM TARTRATE 5 MG PO TABS: 5.0000 mg | ORAL_TABLET | Freq: Every evening | ORAL | Status: DC | PRN

## 2022-09-05 MED ORDER — ROSUVASTATIN CALCIUM 10 MG PO TABS
10.0000 mg | ORAL_TABLET | Freq: Every day | ORAL | Status: DC
  Administered 2022-09-05: 10 mg via ORAL
  Filled 2022-09-05 (×2): qty 1

## 2022-09-05 MED ORDER — HYDROXYZINE PAMOATE 25 MG PO CAPS: 25.0000 mg | ORAL_CAPSULE | Freq: Two times a day (BID) | ORAL | Status: DC | PRN

## 2022-09-05 MED ORDER — CHLORHEXIDINE GLUCONATE 4 % EX LIQD
60.0000 mL | Freq: Once | CUTANEOUS | Status: AC
  Administered 2022-09-06: 4 via TOPICAL

## 2022-09-05 MED ORDER — MORPHINE SULFATE (PF) 2 MG/ML IV SOLN
2.0000 mg | INTRAVENOUS | Status: DC | PRN
  Administered 2022-09-05 (×2): 2 mg via INTRAVENOUS
  Filled 2022-09-05 (×2): qty 1

## 2022-09-05 NOTE — Assessment & Plan Note (Signed)
-   As needed Nicorette gum ordered - Patient states that he is in the process of gradual tobacco cessation - Resumed home Chantix 1 mg p.o. twice daily

## 2022-09-05 NOTE — Assessment & Plan Note (Signed)
-   Not in acute exacerbation - Resumed Breo Ellipta home dosing daily - Albuterol 2 puff inhalation every 6 hours as needed for wheezing and shortness of breath resumed

## 2022-09-05 NOTE — Consult Note (Signed)
Pharmacy Antibiotic Note  Anthony Bender is a 66 y.o. male w/ h/o hypertension, depression, insomnia, hyperlipidemia, restless leg syndrome, history of persistent abscess and cellulitis of the fifth left toe, who presents emergency department for chief concerns of persistent abscess and cellulitis left fifth toe, failing outpatient therapy now admitted on 09/05/2022.  Pharmacy has been consulted for zosyn dosing for wound infection.  Plan: Initiate Zosyn 3.375g IV q8h starting 12/01 1600  Height: '5\' 11"'$  (180.3 cm) Weight: 88.5 kg (195 lb) IBW/kg (Calculated) : 75.3  Temp (24hrs), Avg:97.7 F (36.5 C), Min:97.7 F (36.5 C), Max:97.7 F (36.5 C)  Recent Labs  Lab 09/05/22 1213 09/05/22 1214  WBC 8.0  --   CREATININE 1.00  --   LATICACIDVEN  --  1.3    Estimated Creatinine Clearance: 76.3 mL/min (by C-G formula based on SCr of 1 mg/dL).    Allergies  Allergen Reactions   Yellow Jacket Venom [Bee Venom] Anaphylaxis and Shortness Of Breath   Codeine Itching   Metaxalone Swelling   Venlafaxine Other (See Comments)    Loss of appetite, jittery   Zyban [Bupropion] Other (See Comments)    hallucinations   Fluoxetine Anxiety    anger    Antimicrobials this admission: VAN x1 (12/01) ZOS (12/01 >>   Dose adjustments this admission: CTM and adjust PRN  Microbiology results: None  Thank you for allowing pharmacy to be a part of this patient's care.  Lorna Dibble 09/05/2022 3:55 PM

## 2022-09-05 NOTE — Progress Notes (Signed)
PHARMACY -  BRIEF ANTIBIOTIC NOTE   Pharmacy has received consult(s) for vancomycin  from an ED provider.  The patient's profile has been reviewed for ht/wt/allergies/indication/available labs.    One time order(s) placed for vancomycin '1750mg'$  IV x 1 dose   Further antibiotics/pharmacy consults should be ordered by admitting physician if indicated.                       Thank you, Pernell Dupre, PharmD, BCPS Clinical Pharmacist 09/05/2022 2:21 PM

## 2022-09-05 NOTE — Assessment & Plan Note (Addendum)
-   Check second lactic acid - Podiatry, Dr. Vickki Muff has been consulted via secure chat, awaiting response - Patient does not have history of diabetes mellitus, and I suspect the wound is poor healing due to chronic tobacco use - Patient is in the process of trying to quit smoking - Will continue with Zosyn per pharmacy for wound infection - N.p.o. after midnight - Admitted to [place], observation

## 2022-09-05 NOTE — ED Provider Notes (Addendum)
[facility] Provider Note    Event Date/Time   First MD Initiated Contact with Patient 09/05/22 1319     (approximate)   History   Foot Pain   HPI  Anthony Bender is a 66 y.o. male.  Who comes in with left foot and was diagnosed with cellulitis with left fifth digit ulcer.  He is not diabetic but was on antibiotics without any relief so came in for possible amputation.  Patient reports he has been on the antibiotics for 3 days without any relief.  He states that the podiatrist was concerned about osteomyelitis.  He denies any fevers.  Patient reports that he saw Dr. Luana Shu today who told him to come into the emergency room for admission    Physical Exam   Triage Vital Signs: ED Triage Vitals  Enc Vitals Group     BP 09/05/22 1210 (!) 125/93     Pulse Rate 09/05/22 1210 (!) 103     Resp 09/05/22 1210 18     Temp 09/05/22 1210 97.7 F (36.5 C)     Temp Source 09/05/22 1210 Oral     SpO2 09/05/22 1210 96 %     Weight 09/05/22 1212 195 lb (88.5 kg)     Height 09/05/22 1212 '5\' 11"'$  (1.803 m)     Head Circumference --      Peak Flow --      Pain Score 09/05/22 1211 5     Pain Loc --      Pain Edu? --      Excl. in [place]? --     Most recent vital signs: Vitals:   09/05/22 1210  BP: (!) 125/93  Pulse: (!) 103  Resp: 18  Temp: 97.7 F (36.5 C)  SpO2: 96%     General: Awake, no distress.  CV:  Good peripheral perfusion.  Resp:  Normal effort.  Abd:  No distention.  Other:  Patient has redness and warmth noted of his left foot with a small ulceration on the bottom of his toe of the fifth digit.   ED Results / Procedures / Treatments   Labs (all labs ordered are listed, but only abnormal results are displayed) Labs Reviewed  COMPREHENSIVE METABOLIC PANEL - Abnormal; Notable for the following components:      Result Value   Glucose, Bld 103 (*)    All other components within normal limits  LACTIC ACID, PLASMA  CBC WITH  DIFFERENTIAL/PLATELET  LACTIC ACID, PLASMA       PROCEDURES:  Critical Care performed: No  Procedures   MEDICATIONS ORDERED IN ED: Medications  vancomycin (VANCOREADY) IVPB 1750 mg/350 mL (has no administration in time range)  acetaminophen (TYLENOL) tablet 650 mg (has no administration in time range)    Or  acetaminophen (TYLENOL) suppository 650 mg (has no administration in time range)  ondansetron (ZOFRAN) tablet 4 mg (has no administration in time range)    Or  ondansetron (ZOFRAN) injection 4 mg (has no administration in time range)  heparin injection 5,000 Units (has no administration in time range)  senna-docusate (Senokot-S) tablet 1 tablet (has no administration in time range)  hydrALAZINE (APRESOLINE) injection 5 mg (has no administration in time range)     IMPRESSION / MDM / ASSESSMENT AND PLAN / ED COURSE  I reviewed the triage vital signs and the nursing notes.   Patient's presentation is most consistent with acute presentation with potential threat to life or bodily function.   Patient  comes in with cellulitis failing outpatient therapy as well as ulceration and concern for osteomyelitis.  Patient does not meet sepsis criteria so blood cultures were held and patient was started on broad-spectrum antibiotics.  I have messaged and called [facility] podiatry and they have not answered yet but given patient's examination and the history from patient I will admit to the hospital team for further work-up and podiatry consultation.  CMP reassuring CBC normal white count.  Lactate normal     FINAL CLINICAL IMPRESSION(S) / ED DIAGNOSES   Final diagnoses:  Cellulitis of left lower extremity  Ulcer of left foot, unspecified ulcer stage ([place])     Rx / DC Orders   ED Discharge Orders     None        Note:  This document was prepared using Dragon voice recognition software and may include unintentional dictation errors.   Vanessa Pullman, MD 09/05/22 1430     Vanessa Esmont, MD 09/05/22 1430

## 2022-09-05 NOTE — ED Triage Notes (Signed)
Pt comes with c/o left foot pain. Pt states he was sent over for possible amputation. Pt was dx with cellulitis with left 5 digit ulcer. Pt is not diabetic and was also prescribed meds with no relief.

## 2022-09-05 NOTE — Assessment & Plan Note (Signed)
-   Resumed irbesartan 150 mg daily

## 2022-09-05 NOTE — Assessment & Plan Note (Signed)
-   Resumed home zolpidem 5 mg nightly as needed for sleep 

## 2022-09-05 NOTE — Hospital Course (Signed)
Mr. Britian Jentz is a 66 year old male with history of hypertension, depression, insomnia, hyperlipidemia, restless leg syndrome, history of persistent abscess and cellulitis of the fifth left toe, who presents emergency department for chief concerns of persistent abscess and cellulitis left fifth toe, failing outpatient therapy.  Vitals in the emergency department showed temperature of 97.7, respiration rate of 18, heart rate of 103, blood pressure 125/93, SpO2 of 96% on room air.  Serum sodium is 136, potassium 4.0, chloride 102, bicarb 24, BUN of 17, serum creatinine of 1.00, GFR greater than 60, nonfasting blood glucose 103, WBC 8.0, hemoglobin 16, platelets of 276.  Lactic acid is 1.3.  ED treatment: Zosyn and vancomycin.  EDP also consulted Dr. Vickki Muff as patient is seen by Dr. Luana Shu outpatient for podiatry needs.  12/1: Patient underwent amputation of left fifth toe by podiatry, sent for culture and pathology. Labs seems stable.  Patient received vancomycin and cefepime while in the hospital.  He is stable for discharge and podiatry is recommending discharging on Augmentin for 7 days and he will follow-up with them as an outpatient for further recommendations and culture results.  PT also evaluated him and there was no follow-up recommendations.  Patient will continue with his home medications and follow-up with his providers.

## 2022-09-05 NOTE — ED Notes (Signed)
Janett Billow RN attempting for IV. Will place IV team consult if necessary.

## 2022-09-05 NOTE — ED Notes (Signed)
To bedside with vanc but pt has no IV yet.

## 2022-09-05 NOTE — H&P (Signed)
History and Physical   Anthony Bender JKK:[id number] DOB: 1955-10-31 DOA: 09/05/2022  PCP: Anthony Jacobs, MD  Outpatient Specialists: Dr. Luana Bender, podiatry Patient coming from: Outpatient podiatry clinic  I have personally briefly reviewed patient's old medical records in [facility].  Chief Concern: Left fifth toe ulcer and persistent cellulitis  HPI: Mr. Anthony Bender is a 66 year old male with history of hypertension, depression, insomnia, hyperlipidemia, restless leg syndrome, history of persistent abscess and cellulitis of the fifth left toe, who presents emergency department for chief concerns of persistent abscess and cellulitis left fifth toe, failing outpatient therapy.  Vitals in the emergency department showed temperature of 97.7, respiration rate of 18, heart rate of 103, blood pressure 125/93, SpO2 of 96% on room air.  Serum sodium is 136, potassium 4.0, chloride 102, bicarb 24, BUN of 17, serum creatinine of 1.00, GFR greater than 60, nonfasting blood glucose 103, WBC 8.0, hemoglobin 16, platelets of 276.  Lactic acid is 1.3.  ED treatment: Zosyn and vancomycin.  EDP also consulted Dr. Vickki Bender as patient is seen by Dr. Luana Bender outpatient for podiatry needs.  At bedside, he is able to tell me his name, age, current location, current year.  He reports the 5th toe of his left foot has been bothering him since Monday, 09/01/22. He denies swelling of his extremities except for the 5th left toe area.   He denies fever, new cough, chest pain, shortness of breath, dysuria, abdominal pain, hematuria, diarrhea.   Social history: He lives at home with his wife.  He currently smokes 0.75 packs per day and at his peak, he smoked 2 ppd.  He denies recreational drug use.  He denies EtOH.  ROS: Constitutional: no weight change, no fever ENT/Mouth: no sore throat, no rhinorrhea Eyes: no eye pain, no vision changes Cardiovascular: no chest pain, no dyspnea,  no edema, no  palpitations Respiratory: no cough, no sputum, no wheezing Gastrointestinal: no nausea, no vomiting, no diarrhea, no constipation Genitourinary: no urinary incontinence, no dysuria, no hematuria Musculoskeletal: no arthralgias, no myalgias Skin: + skin lesions and redness, no pruritus, Neuro: + weakness, no loss of consciousness, no syncope Psych: no anxiety, no depression, no decrease appetite Heme/Lymph: no bruising, no bleeding  ED Course: Discussed with emergency medicine provider, patient requiring hospitalization for chief concerns of left fifth toe cellulitis and ulcer failing outpatient therapy  Assessment/Plan  Principal Problem:   Open wound of fifth toe of left foot, initial encounter Active Problems:   Benign prostatic hyperplasia   Chronic insomnia   COPD (chronic obstructive pulmonary disease) (HCC)   Hyperlipidemia, unspecified   Personal history of tobacco use, presenting hazards to health   Neuropathy, peripheral, idiopathic   Hypertension   Assessment and Plan:  * Open wound of fifth toe of left foot, initial encounter - Check second lactic acid - Podiatry, Dr. Vickki Bender has been consulted via secure chat, awaiting response - Patient does not have history of diabetes mellitus, and I suspect the wound is poor healing due to chronic tobacco use - Patient is in the process of trying to quit smoking - Will continue with Zosyn per pharmacy for wound infection - N.p.o. after midnight - Admitted to Anthony Bender, observation  Hypertension - Resumed irbesartan 150 mg daily  Personal history of tobacco use, presenting hazards to health - As needed Nicorette gum ordered - Patient states that he is in the process of gradual tobacco cessation - Resumed home Chantix 1 mg p.o. twice daily  Hyperlipidemia,  unspecified - Rosuvastatin 10 mg nightly resumed  COPD (chronic obstructive pulmonary disease) ([place]) - Not in acute exacerbation - Resumed Breo Ellipta home dosing  daily - Albuterol 2 puff inhalation every 6 hours as needed for wheezing and shortness of breath resumed  Chronic insomnia - Resumed home zolpidem 5 mg nightly as needed for sleep  Chart reviewed.   Patient was seen on 09/02/2022 at [facility] outpatient for cellulitis and abscess of the left foot, left fifth toe - Patient was prescribed doxycycline 100 mg p.o. twice daily for 10 days and cefdinir 300 mg p.o. twice daily also for 10 days  DVT prophylaxis: Heparin 5000 units subcutaneous every 8 hours, 1 doses ordered.  AM team to reinitiate pharmacologic DVT prophylaxis when the benefits outweigh the risk Code Status: Full code Diet: Heart healthy; n.p.o. after midnight Family Communication: No Disposition Plan: Pending clinical course Consults called: Podiatry Admission status: MedSurg, observation  Past Medical History:  Diagnosis Date   Anxiety    Asthma    Barrett's esophagus    BPH (benign prostatic hyperplasia)    COPD (chronic obstructive pulmonary disease) ([place])    DDD (degenerative disc disease), lumbar    GERD (gastroesophageal reflux disease)    Headache    Hemorrhoids    Hyperlipidemia    Hyperlipidemia    Hypertension    Insomnia    Neuropathy    Osteoarthritis    Tobacco use    Vitamin D deficiency    Past Surgical History:  Procedure Laterality Date   APPENDECTOMY     BACK SURGERY     injection   ESOPHAGOGASTRODUODENOSCOPY N/A 03/15/2015   Procedure: ESOPHAGOGASTRODUODENOSCOPY (EGD);  Surgeon: Hulen Luster, MD;  Location: [facility] ENDOSCOPY;  Service: Gastroenterology;  Laterality: N/A;   ESOPHAGOGASTRODUODENOSCOPY (EGD) WITH PROPOFOL N/A 08/23/2015   Procedure: ESOPHAGOGASTRODUODENOSCOPY (EGD) WITH PROPOFOL;  Surgeon: Hulen Luster, MD;  Location: [facility] ENDOSCOPY;  Service: Gastroenterology;  Laterality: N/A;   Barrx Procedure   ESOPHAGOGASTRODUODENOSCOPY (EGD) WITH PROPOFOL N/A 01/24/2016   Procedure: ESOPHAGOGASTRODUODENOSCOPY (EGD) WITH PROPOFOL;  Surgeon:  Hulen Luster, MD;  Location: [facility] ENDOSCOPY;  Service: Gastroenterology;  Laterality: N/A;   HERNIA REPAIR     JOINT REPLACEMENT Left    Partial Knee Replacement   KNEE ARTHROSCOPY     PAROTIDECTOMY Left 06/30/2017   Procedure: PAROTIDECTOMY;  Surgeon: Beverly Gust, MD;  Location: [facility] ORS;  Service: ENT;  Laterality: Left;   reattachment of finger Left    little finger   salvia gland tumors removed     SUPERFICIAL PERONEAL NERVE RELEASE     Social History:  reports that he has been smoking cigarettes. He has a 88.50 pack-year smoking history. He has never used smokeless tobacco. He reports that he does not currently use alcohol. He reports current drug use. Drug: Marijuana.  Allergies  Allergen Reactions   Yellow Jacket Venom [Bee Venom] Anaphylaxis and Shortness Of Breath   Codeine Itching   Metaxalone Swelling   Venlafaxine Other (See Comments)    Loss of appetite, jittery   Zyban [Bupropion] Other (See Comments)    hallucinations   Fluoxetine Anxiety    anger   Family History  Problem Relation Age of Onset   Diabetes Mother    Hypertension Mother    Hypertension Father    Family history: Family history reviewed and not pertinent  Prior to Admission medications   Medication Sig Start Date End Date Taking? Authorizing Provider  cefdinir (OMNICEF) 300 MG capsule Take 300  mg by mouth 2 (two) times daily. 09/02/22 09/12/22 Yes [provider]  doxycycline (VIBRAMYCIN) 100 MG capsule Take 100 mg by mouth 2 (two) times daily. 09/02/22 09/12/22 Yes [provider]  DULoxetine (CYMBALTA) 30 MG capsule Take 1 capsule (30 mg total) by mouth 2 (two) times daily. 06/01/18  Yes Ursula Alert, MD  esomeprazole (NEXIUM) 40 MG capsule Take 40 mg by mouth 2 (two) times daily before a meal.  02/23/15  Yes [provider]  LYRICA 150 MG capsule Take 150 mg by mouth 3 (three) times daily.  01/02/15  Yes [provider]  mupirocin ointment (BACTROBAN) 2 % Apply  1 Application topically daily. 09/02/22  Yes [provider]  nortriptyline (PAMELOR) 10 MG capsule Take 20 mg by mouth at bedtime. 04/23/22  Yes [provider]  rOPINIRole (REQUIP) 0.5 MG tablet Take 0.5 mg by mouth 4 (four) times daily as needed.   Yes [provider]  rosuvastatin (CRESTOR) 10 MG tablet Take 10 mg by mouth at bedtime.   Yes [provider]  SUMAtriptan (IMITREX) 25 MG tablet Take 25 mg by mouth every 2 (two) hours as needed for migraine or headache. 04/23/22  Yes [provider]  telmisartan (MICARDIS) 80 MG tablet Take 80 mg by mouth daily. 06/03/21  Yes [provider]  TRELEGY ELLIPTA 100-62.5-25 MCG/ACT AEPB Inhale 1 puff into the lungs daily.   Yes [provider]  varenicline (CHANTIX) 1 MG tablet Take 1 mg by mouth 2 (two) times daily.   Yes [provider]  zolpidem (AMBIEN) 10 MG tablet Take 0.5 tablets (5 mg total) by mouth at bedtime. PATIENT HAS SUPPLIES 01/12/18  Yes Eappen, Ria Clock, MD  albuterol (PROVENTIL HFA;VENTOLIN HFA) 108 (90 Base) MCG/ACT inhaler Inhale into the lungs every 6 (six) hours as needed for wheezing or shortness of breath.    [provider]  diazepam (VALIUM) 5 MG tablet Take by mouth. Patient not taking: Reported on 09/05/2022 06/01/18   [provider]  fluticasone (FLONASE) 50 MCG/ACT nasal spray Place 2 sprays into both nostrils 2 (two) times daily.  01/02/15   [provider]  gemfibrozil (LOPID) 600 MG tablet Take 600 mg by mouth 2 (two) times daily before a meal.  Patient not taking: Reported on 09/05/2022 02/01/15   [provider]  hydrOXYzine (VISTARIL) 25 MG capsule Take 1 capsule (25 mg total) by mouth 2 (two) times daily as needed (FOR SEVERE ANXIETY SX). Patient not taking: Reported on 09/05/2022 06/01/18   Ursula Alert, MD  losartan (COZAAR) 100 MG tablet Take by mouth. Patient not taking: Reported on 09/05/2022 11/02/17   [provider]  mirtazapine (REMERON) 30 MG tablet Take 1 tablet (30 mg total) by mouth at bedtime. Patient not taking: Reported on 09/05/2022 06/01/18   Ursula Alert, MD  omega-3 acid ethyl esters (LOVAZA) 1 g capsule Take 1 g by mouth daily.  Patient not taking: Reported on 09/05/2022    [provider]  QUEtiapine (SEROQUEL) 25 MG tablet Take 1-2 tablets (25-50 mg total) by mouth at bedtime. Take 1 tablet at bedtime for 5 days and then increase to 2 tablets Patient not taking: Reported on 09/05/2022 06/01/18   Ursula Alert, MD  QUEtiapine (SEROQUEL) 25 MG tablet Take 1 tablet (25 mg total) by mouth daily as needed. For severe anxiety sx Patient not taking: Reported on 09/05/2022 06/01/18   Ursula Alert, MD  [facility] HANDIHALER 18 MCG inhalation capsule  01/02/15  [provider]  SYMBICORT 160-4.5 MCG/ACT inhaler inhale 2 puffs by mouth INTO LUNGS twice a day Patient not taking: Reported on 09/05/2022 12/13/17   [provider]   Physical Exam: Vitals:   09/05/22 1210 09/05/22 1212  BP: (!) 125/93   Pulse: (!) 103   Resp: 18   Temp: 97.7 F (36.5 C)   TempSrc: Oral   SpO2: 96%   Weight:  88.5 kg  Height:  '5\' 11"'$  (1.803 m)   Constitutional: appears age-appropriate frail, NAD, calm, comfortable Eyes: PERRL, lids and conjunctivae normal ENMT: Mucous membranes are moist. Posterior pharynx clear of any exudate or lesions. Age-appropriate dentition. Hearing appropriate Neck: normal, supple, no masses, no thyromegaly Respiratory: clear to auscultation bilaterally, no wheezing, no crackles. Normal respiratory effort. No accessory muscle use.  Cardiovascular: Regular rate and rhythm, no murmurs / rubs / gallops. No extremity edema. 2+ pedal pulses. No carotid bruits.  Abdomen: no tenderness, no masses palpated, no hepatosplenomegaly. Bowel sounds positive.  Musculoskeletal: no clubbing / cyanosis. No joint deformity upper and lower extremities. Good ROM, no  contractures, no atrophy. Normal muscle tone.  Skin: no rashes, lesions, ulcers. No induration      Neurologic: Sensation intact. Strength 5/5 in all 4.  Psychiatric: Normal judgment and insight. Alert and oriented x 3. Normal mood.   EKG: Not indicated at this time  Chest x-ray on Admission: Not indicated at this time  Labs on Admission: I have personally reviewed following labs  CBC: Recent Labs  Lab 09/05/22 1213  WBC 8.0  NEUTROABS 4.7  HGB 16.0  HCT 48.4  MCV 97.0  PLT 329   Basic Metabolic Panel: Recent Labs  Lab 09/05/22 1213  NA 136  K 4.0  CL 102  CO2 24  GLUCOSE 103*  BUN 17  CREATININE 1.00  CALCIUM 9.3   GFR: Estimated Creatinine Clearance: 76.3 mL/min (by C-G formula based on SCr of 1 mg/dL).  Liver Function Tests: Recent Labs  Lab 09/05/22 1213  AST 25  ALT 22  ALKPHOS 83  BILITOT 0.8  PROT 7.8  ALBUMIN 3.7   Urine analysis:    Component Value Date/Time   COLORURINE Straw 07/22/2014 1144   APPEARANCEUR Clear 07/22/2014 1144   LABSPEC 1.005 07/22/2014 1144   PHURINE 6.0 07/22/2014 1144   GLUCOSEU Negative 07/22/2014 1144   HGBUR Negative 07/22/2014 1144   BILIRUBINUR Negative 07/22/2014 1144   KETONESUR Negative 07/22/2014 1144   PROTEINUR Negative 07/22/2014 1144   NITRITE Negative 07/22/2014 1144   LEUKOCYTESUR Negative 07/22/2014 1144   Dr. Tobie Poet [facility]  If 7PM-7AM, please contact overnight-coverage provider If 7AM-7PM, please contact day coverage provider [email or web address]  09/05/2022, 3:42 PM
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
## 2022-09-05 NOTE — ED Notes (Signed)
Attempted for 20g IV x2. Will have another RN try soon.

## 2022-09-05 NOTE — ED Notes (Signed)
Informed RN bed assigned 

## 2022-09-05 NOTE — ED Notes (Signed)
Pt c/o 7/10 pain. Will address once IV access obtained. Pt calmly sitting on bed, resp reg/unlabored and skin dry.

## 2022-09-05 NOTE — ED Notes (Signed)
Attending A. Cox notified pt c/o 7/10 L foot pain. Awaiting reply.

## 2022-09-05 NOTE — Assessment & Plan Note (Signed)
Continue Crestor and Zetia 

## 2022-09-05 NOTE — ED Notes (Signed)
Dorsalis pedis pulse of L foot confirmed on doppler. Pt's L foot hot and red with tiny open wound to lateral area of pinky toe. Small amount of drainage that is red/clear.

## 2022-09-06 ENCOUNTER — Observation Stay: Payer: No Typology Code available for payment source | Admitting: Anesthesiology

## 2022-09-06 ENCOUNTER — Other Ambulatory Visit: Payer: Self-pay

## 2022-09-06 ENCOUNTER — Encounter
Admission: EM | Disposition: A | Discharge status: Home / Self Care | Payer: Self-pay | Source: Home / Self Care | Attending: Emergency Medicine

## 2022-09-06 DIAGNOSIS — S91105A Unspecified open wound of left lesser toe(s) without damage to nail, initial encounter: Secondary | ICD-10-CM | POA: Diagnosis not present

## 2022-09-06 HISTORY — PX: AMPUTATION TOE: SHX6595

## 2022-09-06 LAB — CBC
HCT: 46.7 % (ref 39.0–52.0)
Hemoglobin: 15.9 g/dL (ref 13.0–17.0)
MCH: 33 pg (ref 26.0–34.0)
MCHC: 34 g/dL (ref 30.0–36.0)
MCV: 96.9 fL (ref 80.0–100.0)
Platelets: 271 10*3/uL (ref 150–400)
RBC: 4.82 MIL/uL (ref 4.22–5.81)
RDW: 13.5 % (ref 11.5–15.5)
WBC: 8.3 10*3/uL (ref 4.0–10.5)
nRBC: 0 % (ref 0.0–0.2)

## 2022-09-06 LAB — BASIC METABOLIC PANEL
Anion gap: 7 (ref 5–15)
BUN: 17 mg/dL (ref 8–23)
CO2: 28 mmol/L (ref 22–32)
Calcium: 9.5 mg/dL (ref 8.9–10.3)
Chloride: 101 mmol/L (ref 98–111)
Creatinine, Ser: 1.1 mg/dL (ref 0.61–1.24)
GFR, Estimated: >60 mL/min (ref >60)
Glucose, Bld: 141 mg/dL — ABNORMAL HIGH (ref 70–99)
Potassium: 4.8 mmol/L (ref 3.5–5.1)
Sodium: 136 mmol/L (ref 135–145)

## 2022-09-06 LAB — SURGICAL PCR SCREEN
MRSA, PCR: NEGATIVE
Staphylococcus aureus: POSITIVE — AB

## 2022-09-06 LAB — HIV ANTIBODY (ROUTINE TESTING W REFLEX): HIV Screen 4th Generation wRfx: NONREACTIVE

## 2022-09-06 SURGERY — AMPUTATION, TOE
Anesthesia: General | Site: Toe | Laterality: Left

## 2022-09-06 MED ORDER — ACETAMINOPHEN 10 MG/ML IV SOLN
INTRAVENOUS | Status: AC
  Filled 2022-09-06: qty 100

## 2022-09-06 MED ORDER — PROPOFOL 10 MG/ML IV BOLUS
INTRAVENOUS | Status: AC
  Filled 2022-09-06: qty 20

## 2022-09-06 MED ORDER — AMOXICILLIN-POT CLAVULANATE 875-125 MG PO TABS: 1.0000 | ORAL_TABLET | Freq: Two times a day (BID) | ORAL | 0 refills | Status: AC

## 2022-09-06 MED ORDER — PROPOFOL 10 MG/ML IV BOLUS
INTRAVENOUS | Status: DC | PRN
  Administered 2022-09-06: 50 mg via INTRAVENOUS

## 2022-09-06 MED ORDER — HYDROMORPHONE HCL 1 MG/ML IJ SOLN: 0.2500 mg | INTRAMUSCULAR | Status: DC | PRN

## 2022-09-06 MED ORDER — ONDANSETRON HCL 4 MG/2ML IJ SOLN
INTRAMUSCULAR | Status: AC
  Filled 2022-09-06: qty 2

## 2022-09-06 MED ORDER — DEXMEDETOMIDINE HCL IN NACL 200 MCG/50ML IV SOLN
INTRAVENOUS | Status: DC | PRN
  Administered 2022-09-06: 4 ug via INTRAVENOUS
  Administered 2022-09-06: 12 ug via INTRAVENOUS

## 2022-09-06 MED ORDER — LIDOCAINE HCL (PF) 1 % IJ SOLN
INTRAMUSCULAR | Status: DC | PRN
  Administered 2022-09-06: 5 mL

## 2022-09-06 MED ORDER — PHENYLEPHRINE 80 MCG/ML (10ML) SYRINGE FOR IV PUSH (FOR BLOOD PRESSURE SUPPORT)
PREFILLED_SYRINGE | INTRAVENOUS | Status: AC
  Filled 2022-09-06: qty 10

## 2022-09-06 MED ORDER — 0.9 % SODIUM CHLORIDE (POUR BTL) OPTIME
TOPICAL | Status: DC | PRN
  Administered 2022-09-06: 1000 mL

## 2022-09-06 MED ORDER — MIDAZOLAM HCL 2 MG/2ML IJ SOLN
INTRAMUSCULAR | Status: DC | PRN
  Administered 2022-09-06: 2 mg via INTRAVENOUS

## 2022-09-06 MED ORDER — LACTATED RINGERS IV SOLN: INTRAVENOUS | Status: DC | PRN

## 2022-09-06 MED ORDER — MIDAZOLAM HCL 2 MG/2ML IJ SOLN
INTRAMUSCULAR | Status: AC
  Filled 2022-09-06: qty 2

## 2022-09-06 MED ORDER — PROPOFOL 500 MG/50ML IV EMUL
INTRAVENOUS | Status: DC | PRN
  Administered 2022-09-06: 150 ug/kg/min via INTRAVENOUS

## 2022-09-06 MED ORDER — LIDOCAINE HCL (PF) 2 % IJ SOLN
INTRAMUSCULAR | Status: AC
  Filled 2022-09-06: qty 5

## 2022-09-06 MED ORDER — OXYCODONE HCL 5 MG PO TABS: 5.0000 mg | ORAL_TABLET | Freq: Once | ORAL | Status: DC | PRN

## 2022-09-06 MED ORDER — PHENYLEPHRINE HCL (PRESSORS) 10 MG/ML IV SOLN
INTRAVENOUS | Status: DC | PRN
  Administered 2022-09-06 (×3): 160 ug via INTRAVENOUS

## 2022-09-06 MED ORDER — ACETAMINOPHEN 10 MG/ML IV SOLN
INTRAVENOUS | Status: DC | PRN
  Administered 2022-09-06: 1000 mg via INTRAVENOUS

## 2022-09-06 MED ORDER — BUPIVACAINE HCL 0.5 % IJ SOLN
INTRAMUSCULAR | Status: DC | PRN
  Administered 2022-09-06: 5 mL

## 2022-09-06 MED ORDER — LIDOCAINE HCL (CARDIAC) PF 100 MG/5ML IV SOSY
PREFILLED_SYRINGE | INTRAVENOUS | Status: DC | PRN
  Administered 2022-09-06: 80 mg via INTRAVENOUS

## 2022-09-06 MED ORDER — FENTANYL CITRATE (PF) 100 MCG/2ML IJ SOLN
INTRAMUSCULAR | Status: AC
  Filled 2022-09-06: qty 2

## 2022-09-06 MED ORDER — HYDROCODONE-ACETAMINOPHEN 5-325 MG PO TABS: 1.0000 | ORAL_TABLET | ORAL | 0 refills | Status: DC | PRN

## 2022-09-06 MED ORDER — OXYCODONE HCL 5 MG/5ML PO SOLN: 5.0000 mg | Freq: Once | ORAL | Status: DC | PRN

## 2022-09-06 MED ORDER — DEXAMETHASONE SODIUM PHOSPHATE 10 MG/ML IJ SOLN
INTRAMUSCULAR | Status: AC
  Filled 2022-09-06: qty 1

## 2022-09-06 SURGICAL SUPPLY — 57 items
BLADE OSC/SAGITTAL MD 5.5X18 (BLADE) ×2 IMPLANT
BLADE SURG MINI STRL (BLADE) ×2 IMPLANT
BNDG CMPR 5X4 CHSV STRCH STRL (GAUZE/BANDAGES/DRESSINGS) ×1
BNDG CMPR 75X21 PLY HI ABS (MISCELLANEOUS) ×1
BNDG CMPR 75X41 PLY HI ABS (GAUZE/BANDAGES/DRESSINGS) ×1
BNDG CMPR STD VLCR NS LF 5.8X4 (GAUZE/BANDAGES/DRESSINGS) ×1
BNDG COHESIVE 4X5 TAN STRL LF (GAUZE/BANDAGES/DRESSINGS) IMPLANT
BNDG ELASTIC 4X5.8 VLCR NS LF (GAUZE/BANDAGES/DRESSINGS) ×2 IMPLANT
BNDG ELASTIC 4X5.8 VLCR STR LF (GAUZE/BANDAGES/DRESSINGS) IMPLANT
BNDG ESMARK 4X12 TAN STRL LF (GAUZE/BANDAGES/DRESSINGS) ×2 IMPLANT
BNDG GAUZE DERMACEA FLUFF 4 (GAUZE/BANDAGES/DRESSINGS) ×2 IMPLANT
BNDG GZE 12X3 1 PLY HI ABS (GAUZE/BANDAGES/DRESSINGS) ×2
BNDG GZE DERMACEA 4 6PLY (GAUZE/BANDAGES/DRESSINGS) ×1
BNDG STRETCH 4X75 STRL LF (GAUZE/BANDAGES/DRESSINGS) IMPLANT
BNDG STRETCH GAUZE 3IN X12FT (GAUZE/BANDAGES/DRESSINGS) ×4 IMPLANT
CNTNR SPEC 2.5X3XGRAD LEK (MISCELLANEOUS) ×2
CONT SPEC 4OZ STRL OR WHT (MISCELLANEOUS) ×2
CONTAINER SPEC 2.5X3XGRAD LEK (MISCELLANEOUS) IMPLANT
CUFF TOURN SGL QUICK 12 (TOURNIQUET CUFF) IMPLANT
CUFF TOURN SGL QUICK 18X4 (TOURNIQUET CUFF) IMPLANT
DRAPE FLUOR MINI C-ARM 54X84 (DRAPES) ×2 IMPLANT
DRAPE XRAY CASSETTE 23X24 (DRAPES) ×2 IMPLANT
DRSG XEROFORM 1X8 (GAUZE/BANDAGES/DRESSINGS) IMPLANT
DURAPREP 26ML APPLICATOR (WOUND CARE) ×2 IMPLANT
ELECT REM PT RETURN 9FT ADLT (ELECTROSURGICAL) ×1
ELECTRODE REM PT RTRN 9FT ADLT (ELECTROSURGICAL) ×2 IMPLANT
GAUZE PACKING IODOFORM 1/2INX (GAUZE/BANDAGES/DRESSINGS) ×2 IMPLANT
GAUZE SPONGE 4X4 12PLY STRL (GAUZE/BANDAGES/DRESSINGS) ×2 IMPLANT
GAUZE STRETCH 2X75IN STRL (MISCELLANEOUS) ×2 IMPLANT
GAUZE XEROFORM 1X8 LF (GAUZE/BANDAGES/DRESSINGS) ×2 IMPLANT
GLOVE BIO SURGEON STRL SZ7.5 (GLOVE) ×2 IMPLANT
GLOVE SURG UNDER LTX SZ8 (GLOVE) ×2 IMPLANT
GOWN STRL REUS W/ TWL XL LVL3 (GOWN DISPOSABLE) ×4 IMPLANT
GOWN STRL REUS W/TWL XL LVL3 (GOWN DISPOSABLE) ×2
IV NS IRRIG 3000ML ARTHROMATIC (IV SOLUTION) ×2 IMPLANT
KIT TURNOVER KIT A (KITS) ×2 IMPLANT
LABEL OR SOLS (LABEL) ×2 IMPLANT
MANIFOLD NEPTUNE II (INSTRUMENTS) ×2 IMPLANT
NDL FILTER BLUNT 18X1 1/2 (NEEDLE) ×2 IMPLANT
NDL HYPO 25X1 1.5 SAFETY (NEEDLE) ×2 IMPLANT
NEEDLE FILTER BLUNT 18X1 1/2 (NEEDLE) ×1 IMPLANT
NEEDLE HYPO 25X1 1.5 SAFETY (NEEDLE) ×1 IMPLANT
NS IRRIG 500ML POUR BTL (IV SOLUTION) ×2 IMPLANT
PACK EXTREMITY ARMC (MISCELLANEOUS) ×2 IMPLANT
PAD ABD DERMACEA PRESS 5X9 (GAUZE/BANDAGES/DRESSINGS) ×4 IMPLANT
PULSAVAC PLUS IRRIG FAN TIP (DISPOSABLE) ×1
SHIELD FULL FACE ANTIFOG 7M (MISCELLANEOUS) ×2 IMPLANT
STOCKINETTE M/LG 89821 (MISCELLANEOUS) ×2 IMPLANT
STRAP SAFETY 5IN WIDE (MISCELLANEOUS) ×2 IMPLANT
SUT ETHILON 3-0 FS-10 30 BLK (SUTURE) ×1
SUT ETHILON 5-0 FS-2 18 BLK (SUTURE) ×2 IMPLANT
SUT VIC AB 4-0 FS2 27 (SUTURE) ×2 IMPLANT
SUTURE EHLN 3-0 FS-10 30 BLK (SUTURE) ×2 IMPLANT
SYR 10ML LL (SYRINGE) ×6 IMPLANT
TIP FAN IRRIG PULSAVAC PLUS (DISPOSABLE) ×2 IMPLANT
TRAP FLUID SMOKE EVACUATOR (MISCELLANEOUS) ×2 IMPLANT
WATER STERILE IRR 500ML POUR (IV SOLUTION) ×2 IMPLANT

## 2022-09-06 NOTE — Progress Notes (Signed)
Patient underwent fifth toe amputation for osteomyelitis of his left foot.  Cellulitis was dramatically improved from yesterday from IV antibiotics.  Amputation was therapeutic at this point.  Good perfusion of the skin was noted post surgery.  From podiatry standpoint patient is stable for discharge.  I have discussed with the patient to keep the dressing clean dry and intact.  He can ambulate as needed in a postoperative shoe.  Discharge instructions have been placed.  He has to follow-up with me next week in the outpatient clinic.  Patient to call Monday for appointment.  Recommend oral antibiotics (Augmentin ) for 7 days and I can follow cultures in the outpatient clinic.

## 2022-09-06 NOTE — Plan of Care (Signed)

## 2022-09-06 NOTE — Discharge Summary (Signed)
Physician Discharge Summary   Patient: Anthony Bender MRN: [id number] DOB: [birth year]  Admit date:     09/05/2022  Discharge date: 09/06/22  Discharge Physician: Lorella Nimrod   PCP: Ashley Jacobs, MD   Recommendations at discharge:  Please obtain CBC and BMP in 1 week Follow-up with podiatry within a week Follow-up with primary care provider  Discharge Diagnoses: Principal Problem:   Open wound of fifth toe of left foot, initial encounter Active Problems:   Benign prostatic hyperplasia   Chronic insomnia   COPD (chronic obstructive pulmonary disease) ([place])   Hyperlipidemia, unspecified   Personal history of tobacco use, presenting hazards to health   Neuropathy, peripheral, idiopathic   Hypertension   Hospital Course: Anthony Bender is a 66 year old male with history of hypertension, depression, insomnia, hyperlipidemia, restless leg syndrome, history of persistent abscess and cellulitis of the fifth left toe, who presents emergency department for chief concerns of persistent abscess and cellulitis left fifth toe, failing outpatient therapy.  Vitals in the emergency department showed temperature of 97.7, respiration rate of 18, heart rate of 103, blood pressure 125/93, SpO2 of 96% on room air.  Serum sodium is 136, potassium 4.0, chloride 102, bicarb 24, BUN of 17, serum creatinine of 1.00, GFR greater than 60, nonfasting blood glucose 103, WBC 8.0, hemoglobin 16, platelets of 276.  Lactic acid is 1.3.  ED treatment: Zosyn and vancomycin.  EDP also consulted Dr. Vickki Muff as patient is seen by Dr. Luana Shu outpatient for podiatry needs.  12/1: Patient underwent amputation of left fifth toe by podiatry, sent for culture and pathology. Labs seems stable.  Patient received vancomycin and cefepime while in the hospital.  He is stable for discharge and podiatry is recommending discharging on Augmentin for 7 days and he will follow-up with them as an outpatient for further  recommendations and culture results.  PT also evaluated him and there was no follow-up recommendations.  Patient will continue with his home medications and follow-up with his providers.  Assessment and Plan: * Open wound of fifth toe of left foot, initial encounter - Check second lactic acid - Podiatry, Dr. Vickki Muff has been consulted via secure chat, awaiting response - Patient does not have history of diabetes mellitus, and I suspect the wound is poor healing due to chronic tobacco use - Patient is in the process of trying to quit smoking - Will continue with Zosyn per pharmacy for wound infection - N.p.o. after midnight - Admitted to [facility], observation  Hypertension - Resumed irbesartan 150 mg daily  Personal history of tobacco use, presenting hazards to health - As needed Nicorette gum ordered - Patient states that he is in the process of gradual tobacco cessation - Resumed home Chantix 1 mg p.o. twice daily  Hyperlipidemia, unspecified - Rosuvastatin 10 mg nightly resumed  COPD (chronic obstructive pulmonary disease) ([place]) - Not in acute exacerbation - Resumed Breo Ellipta home dosing daily - Albuterol 2 puff inhalation every 6 hours as needed for wheezing and shortness of breath resumed  Chronic insomnia - Resumed home zolpidem 5 mg nightly as needed for sleep   Pain control - [place] Controlled Substance Reporting System database was reviewed. and patient was instructed, not to drive, operate heavy machinery, perform activities at heights, swimming or participation in water activities or provide baby-sitting services while on Pain, Sleep and Anxiety Medications; until their outpatient Physician has advised to do so again. Also recommended to not to take more than prescribed Pain, Sleep and  Anxiety Medications.  Consultants: Podiatry Procedures performed: Fifth toe amputation Disposition: Home Diet recommendation:  Discharge Diet Orders (From admission, onward)      Start     Ordered   09/06/22 0000  Diet - low sodium heart healthy        09/06/22 1458           Cardiac diet DISCHARGE MEDICATION: Allergies as of 09/06/2022       Reactions   Yellow Jacket Venom [bee Venom] Anaphylaxis, Shortness Of Breath   Codeine Itching   Metaxalone Swelling   Venlafaxine Other (See Comments)   Loss of appetite, jittery   Zyban [bupropion] Other (See Comments)   hallucinations   Fluoxetine Anxiety   anger        Medication List     STOP taking these medications    cefdinir 300 MG capsule Commonly known as: OMNICEF   diazepam 5 MG tablet Commonly known as: VALIUM   doxycycline 100 MG capsule Commonly known as: VIBRAMYCIN   gemfibrozil 600 MG tablet Commonly known as: LOPID   hydrOXYzine 25 MG capsule Commonly known as: Vistaril   losartan 100 MG tablet Commonly known as: COZAAR   mirtazapine 30 MG tablet Commonly known as: Remeron   omega-3 acid ethyl esters 1 g capsule Commonly known as: LOVAZA   QUEtiapine 25 MG tablet Commonly known as: SEROquel   Spiriva HandiHaler 18 MCG inhalation capsule Generic drug: tiotropium   Symbicort 160-4.5 MCG/ACT inhaler Generic drug: budesonide-formoterol       TAKE these medications    albuterol 108 (90 Base) MCG/ACT inhaler Commonly known as: VENTOLIN HFA Inhale into the lungs every 6 (six) hours as needed for wheezing or shortness of breath.   amoxicillin-clavulanate 875-125 MG tablet Commonly known as: AUGMENTIN Take 1 tablet by mouth 2 (two) times daily for 7 days.   DULoxetine 30 MG capsule Commonly known as: CYMBALTA Take 1 capsule (30 mg total) by mouth 2 (two) times daily.   esomeprazole 40 MG capsule Commonly known as: NEXIUM Take 40 mg by mouth 2 (two) times daily before a meal.   fluticasone 50 MCG/ACT nasal spray Commonly known as: FLONASE Place 2 sprays into both nostrils 2 (two) times daily.   HYDROcodone-acetaminophen 5-325 MG tablet Commonly known  as: NORCO/VICODIN Take 1 tablet by mouth every 4 (four) hours as needed for moderate pain.   Lyrica 150 MG capsule Generic drug: pregabalin Take 150 mg by mouth 3 (three) times daily.   mupirocin ointment 2 % Commonly known as: BACTROBAN Apply 1 Application topically daily.   nortriptyline 10 MG capsule Commonly known as: PAMELOR Take 20 mg by mouth at bedtime.   rOPINIRole 0.5 MG tablet Commonly known as: REQUIP Take 0.5 mg by mouth 4 (four) times daily as needed.   rosuvastatin 10 MG tablet Commonly known as: CRESTOR Take 10 mg by mouth at bedtime.   SUMAtriptan 25 MG tablet Commonly known as: IMITREX Take 25 mg by mouth every 2 (two) hours as needed for migraine or headache.   telmisartan 80 MG tablet Commonly known as: MICARDIS Take 80 mg by mouth daily.   Trelegy Ellipta 100-62.5-25 MCG/ACT Aepb Generic drug: Fluticasone-Umeclidin-Vilant Inhale 1 puff into the lungs daily.   varenicline 1 MG tablet Commonly known as: CHANTIX Take 1 mg by mouth 2 (two) times daily.   zolpidem 10 MG tablet Commonly known as: AMBIEN Take 0.5 tablets (5 mg total) by mouth at bedtime. PATIENT HAS SUPPLIES  Discharge Care Instructions  (From admission, onward)           Start     Ordered   09/06/22 0000  Leave dressing on - Keep it clean, dry, and intact until clinic visit        09/06/22 1458            Follow-up Information     Samara Deist, DPM. Schedule an appointment as soon as possible for a visit in 1 week(s).   Specialty: Podiatry Contact information: [place] [address] [phone]         Ashley Jacobs, MD. Schedule an appointment as soon as possible for a visit in 1 week(s).   Specialty: Family Medicine Contact information: [address] [place] [address] [phone]                Discharge Exam: Anthony Bender Weights   09/05/22 1212  Weight: 88.5 kg   General.     In  no acute distress. Pulmonary.  Lungs clear bilaterally, normal respiratory effort. CV.  Regular rate and rhythm, no JVD, rub or murmur. Abdomen.  Soft, nontender, nondistended, BS positive. CNS.  Alert and oriented .  No focal neurologic deficit. Extremities.  No edema, no cyanosis, pulses intact and symmetrical. Psychiatry.  Judgment and insight appears normal.   Condition at discharge: stable  The results of significant diagnostics from this hospitalization (including imaging, microbiology, ancillary and laboratory) are listed below for reference.   Imaging Studies: No results found.  Microbiology: Results for orders placed or performed during the hospital encounter of 09/05/22  Surgical PCR screen     Status: Abnormal   Collection Time: 09/05/22  9:27 PM   Specimen: Nasal Mucosa; Nasal Swab  Result Value Ref Range Status   MRSA, PCR NEGATIVE NEGATIVE Final   Staphylococcus aureus POSITIVE (A) NEGATIVE Final    Comment: (NOTE) The Xpert SA Assay (FDA approved for NASAL specimens in patients 67 years of age and older), is one component of a comprehensive surveillance program. It is not intended to diagnose infection nor to guide or monitor treatment. Performed at [facility], [address] [place], [place] [address]     Labs: CBC: Recent Labs  Lab 09/05/22 1213 09/06/22 0530  WBC 8.0 8.3  NEUTROABS 4.7  --   HGB 16.0 15.9  HCT 48.4 46.7  MCV 97.0 96.9  PLT 276 650   Basic Metabolic Panel: Recent Labs  Lab 09/05/22 1213 09/06/22 0530  NA 136 136  K 4.0 4.8  CL 102 101  CO2 24 28  GLUCOSE 103* 141*  BUN 17 17  CREATININE 1.00 1.10  CALCIUM 9.3 9.5   Liver Function Tests: Recent Labs  Lab 09/05/22 1213  AST 25  ALT 22  ALKPHOS 83  BILITOT 0.8  PROT 7.8  ALBUMIN 3.7   CBG: No results for input(s): "GLUCAP" in the last 168 hours.  Discharge time spent: greater than 30 minutes.  General.     In no acute distress. Pulmonary.  Lungs  clear bilaterally, normal respiratory effort. CV.  Regular rate and rhythm, no JVD, rub or murmur. Abdomen.  Soft, nontender, nondistended, BS positive. CNS.  Alert and oriented .  No focal neurologic deficit. Extremities.  No edema, no cyanosis, pulses intact and symmetrical. Psychiatry.  Judgment and insight appears normal.   Signed: Lorella Nimrod, MD [facility] 09/06/2022

## 2022-09-06 NOTE — Evaluation (Signed)
Physical Therapy Evaluation Patient Details Name: Anthony Bender MRN: [id number] DOB: [birth year] Today's Date: 09/06/2022  History of Present Illness  66 y.o. male.  Who comes in with left foot and was diagnosed with cellulitis with left fifth digit ulcer.  He is not diabetic but was on antibiotics without any relief.  Now s/p L 5th toe amputation.  Clinical Impression  Pt did very well with mobility, no safety issues.  Good awareness of the need to minimize standing/specificallyWBing on the forefoot and reports he has walker that he will use in conjunction with the surgical shoe.  Pt able to negotiate up/down steps safely and appropriate.  Safe to go home, all questions answered, will sign off. 3     Recommendations for follow up therapy are one component of a multi-disciplinary discharge planning process, led by the attending physician.  Recommendations may be updated based on patient status, additional functional criteria and insurance authorization.  Follow Up Recommendations No PT follow up      Assistance Recommended at Discharge Intermittent Supervision/Assistance  Patient can return home with the following  Assist for transportation    Equipment Recommendations None recommended by PT  Recommendations for Other Services       Functional Status Assessment Patient has had a recent decline in their functional status and demonstrates the ability to make significant improvements in function in a reasonable and predictable amount of time.     Precautions / Restrictions Restrictions Weight Bearing Restrictions: Yes LLE Weight Bearing: Weight bearing as tolerated Other Position/Activity Restrictions: post-op shoe, minimize WBing      Mobility  Bed Mobility Overal bed mobility: Independent                  Transfers Overall transfer level: Modified independent Equipment used: Rolling walker (2 wheels)               General transfer comment: Pt was able to rise  to standing from multiple surfaces w/o assist, minimal cuing    Ambulation/Gait Ambulation/Gait assistance: Modified independent (Device/Increase time) Gait Distance (Feet): 70 Feet Assistive device: Rolling walker (2 wheels)         General Gait Details: Pt showed ability to easily manage in-home distance without issue, aware to insure post-op shoe (with heel WBing focus) and consistent walker use suggested, along with limited standing/WBing activity.  No LOBs or excessive fatigue, pt confident.  Stairs Stairs: Yes Stairs assistance: Min guard Stair Management: One rail Left, Sideways Number of Stairs: 4 General stair comments: Pt easily able to negotiate up/down steps w/o assist.  Instructed on appropriate strategies to insure safety and appropriate WBing  Wheelchair Mobility    Modified Rankin (Stroke Patients Only)       Balance Overall balance assessment: Independent                                           Pertinent Vitals/Pain Pain Assessment Pain Assessment: No/denies pain ("they still got a lot of meds in there")    Home Living Family/patient expects to be discharged to:: Private residence Living Arrangements:  (reports he will have family around much of the time) Available Help at Discharge: Available PRN/intermittently Type of Home: House Home Access: Stairs to enter Entrance Stairs-Rails: Left Entrance Stairs-Number of Steps: 6   Home Layout: Able to live on main level with bedroom/bathroom Home Equipment: Conservation officer, nature (2  wheels)      Prior Function Prior Level of Function : Independent/Modified Independent;Working/employed             Mobility Comments: reports he works 10 hr shifts with lots of walking (maintainence)       Hand Dominance        Extremity/Trunk Assessment   Upper Extremity Assessment Upper Extremity Assessment: Overall WFL for tasks assessed    Lower Extremity Assessment Lower Extremity  Assessment: Overall WFL for tasks assessed       Communication   Communication: No difficulties  Cognition Arousal/Alertness: Awake/alert Behavior During Therapy: WFL for tasks assessed/performed Overall Cognitive Status: Within Functional Limits for tasks assessed                                          General Comments      Exercises     Assessment/Plan    PT Assessment Patient does not need any further PT services  PT Problem List         PT Treatment Interventions      PT Goals (Current goals can be found in the Care Plan section)  Acute Rehab PT Goals Patient Stated Goal: go home PT Goal Formulation: All assessment and education complete, DC therapy    Frequency       Co-evaluation               AM-PAC PT "6 Clicks" Mobility  Outcome Measure Help needed turning from your back to your side while in a flat bed without using bedrails?: None Help needed moving from lying on your back to sitting on the side of a flat bed without using bedrails?: None Help needed moving to and from a bed to a chair (including a wheelchair)?: None Help needed standing up from a chair using your arms (e.g., wheelchair or bedside chair)?: None Help needed to walk in hospital room?: None Help needed climbing 3-5 steps with a railing? : None 6 Click Score: 24    End of Session Equipment Utilized During Treatment: Gait belt Activity Tolerance: Patient tolerated treatment well Patient left: in chair;with call bell/phone within reach;with family/visitor present Nurse Communication: Mobility status;Weight bearing status PT Visit Diagnosis: Difficulty in walking, not elsewhere classified (R26.2)    Time: 8182-9937 PT Time Calculation (min) (ACUTE ONLY): 18 min   Charges:   PT Evaluation $PT Eval Low Complexity: 1 Low          Kreg Shropshire, DPT 09/06/2022, 2:54 PM

## 2022-09-06 NOTE — Transfer of Care (Signed)
Immediate Anesthesia Transfer of Care Note  Patient: Anthony Bender  Procedure(s) Performed: Procedure(s): AMPUTATION TOE (Left)  Patient Location: PACU  Anesthesia Type:General  Level of Consciousness: sedated  Airway & Oxygen Therapy: Patient Spontanous Breathing and Patient connected to face mask oxygen  Post-op Assessment: Report given to RN and Post -op Vital signs reviewed and stable  Post vital signs: Reviewed and stable  Last Vitals:  Vitals:   09/05/22 2343 09/06/22 0855  BP: 124/83 (!) 85/64  Pulse: 80 71  Resp: 16 18  Temp: 36.9 C   SpO2: 92% 11%    Complications: No apparent anesthesia complicationsImmediate Anesthesia Transfer of Care Note  Patient: Anthony Bender  Procedure(s) Performed: Procedure(s): AMPUTATION TOE (Left)  Patient Location: PACU  Anesthesia Type:General  Level of Consciousness: sedated  Airway & Oxygen Therapy: Patient Spontanous Breathing and Patient connected to face mask oxygen  Post-op Assessment: Report given to RN and Post -op Vital signs reviewed and stable  Post vital signs: Reviewed and stable  Last Vitals:  Vitals:   09/05/22 2343 09/06/22 0855  BP: 124/83 (!) 85/64  Pulse: 80 71  Resp: 16 18  Temp: 36.9 C   SpO2: 94% 17%    Complications: No apparent anesthesia complications

## 2022-09-06 NOTE — Discharge Instructions (Signed)
[facility]  POST OPERATIVE INSTRUCTIONS FOR DR. Carmon Sahli AND [facility] POD[facility]PART[facility]   Take your medication as prescribed.  Pain medication should be taken only as needed.  Keep the dressing clean, dry and intact.  Keep your foot elevated above the heart level for the first 48 hours.  Walking to the bathroom and brief periods of walking are acceptable, unless we have instructed you to be non-weight bearing.  Always wear your post-op shoe when walking.  Always use your crutches if you are to be non-weight bearing.  Do not take a shower. Baths are permissible as long as the foot is kept out of the water.   Every hour you are awake:  Bend your knee 15 times. Flex foot 15 times Massage calf 15 times  Call [facility] ([phone]) if any of the following problems occur: You develop a temperature or fever. The bandage becomes saturated with blood. Medication does not stop your pain. Injury of the foot occurs. Any symptoms of infection including redness, odor, or red streaks running from wound. 

## 2022-09-06 NOTE — Op Note (Signed)
Operative note   Surgeon:Alek Poncedeleon Lawyer: None    Preop diagnosis: Osteomyelitis left fifth toe    Postop diagnosis: Same    Procedure: Amputation left fifth toe MTPJ    EBL: Minimal    Anesthesia:local with IV sedation.  Local consisted of a total of 10 cc of a one-to-one mixture of 1% lidocaine plain and 0.5% bupivacaine.    Hemostasis: None    Specimen: Left fifth toe bone for culture and toe for pathology    Complications: None    Operative indications:Anthony Bender is an 66 y.o. that presents today for surgical intervention.  The risks/benefits/alternatives/complications have been discussed and consent has been given.    Procedure:  Patient was brought into the OR and placed on the operating table in thesupine position. After anesthesia was obtained theleft lower extremity was prepped and draped in usual sterile fashion.  Attention was directed to the left fifth toe where 2 semielliptical incisions were performed around the base of the proximal phalanx.  The toe was disarticulated at the joint.  Bone culture was taken and remainder of the toe was sent for pathology.  The wound was flushed with copious amounts of irrigation.  3-0 nylon was used for closure.  Good perfusion of the skin flaps were noted.    Patient tolerated the procedure and anesthesia well.  Was transported from the OR to the PACU with all vital signs stable and vascular status intact. To be discharged per routine protocol.  Will follow up in approximately 1 week in the outpatient clinic.

## 2022-09-06 NOTE — Plan of Care (Signed)

## 2022-09-06 NOTE — Anesthesia Postprocedure Evaluation (Signed)
Anesthesia Post Note  Patient: Anthony Bender  Procedure(s) Performed: AMPUTATION TOE (Left: Toe)  Patient location during evaluation: PACU Anesthesia Type: General Level of consciousness: awake and alert Pain management: pain level controlled Vital Signs Assessment: post-procedure vital signs reviewed and stable Respiratory status: spontaneous breathing, nonlabored ventilation, respiratory function stable and patient connected to nasal cannula oxygen Cardiovascular status: blood pressure returned to baseline and stable Postop Assessment: no apparent nausea or vomiting Anesthetic complications: no   No notable events documented.   Last Vitals:  Vitals:   09/06/22 0855 09/06/22 0915  BP: (!) 85/64 93/67  Pulse: 71 62  Resp: 18 13  Temp: (!) 36.4 C   SpO2: 97% 95%    Last Pain:  Vitals:   09/06/22 0915  TempSrc:   PainSc: 0-No pain                 Ilene Qua

## 2022-09-06 NOTE — Anesthesia Procedure Notes (Signed)
Date/Time: 09/06/2022 8:23 AM  Performed by: Doreen Salvage, CRNAPre-anesthesia Checklist: Patient identified, Emergency Drugs available, Suction available and Patient being monitored Patient Re-evaluated:Patient Re-evaluated prior to induction Oxygen Delivery Method: Simple face mask Induction Type: IV induction Dental Injury: Teeth and Oropharynx as per pre-operative assessment  Comments: Nasal cannula with etCO2 monitoring

## 2022-09-06 NOTE — Anesthesia Preprocedure Evaluation (Addendum)
Anesthesia Evaluation  Patient identified by MRN, date of birth, ID band Patient awake    Reviewed: Allergy & Precautions, NPO status , Patient's Chart, lab work & pertinent test results  History of Anesthesia Complications Negative for: history of anesthetic complications  Airway Mallampati: III  TM Distance: >3 FB Neck ROM: full    Dental  (+) Edentulous Upper, Edentulous Lower   Pulmonary asthma , COPD,  COPD inhaler, Current Smoker and Patient abstained from smoking.   Pulmonary exam normal        Cardiovascular hypertension, On Medications Normal cardiovascular exam     Neuro/Psych  PSYCHIATRIC DISORDERS Anxiety      Neuromuscular disease (neuropathy of unknown origin, denies DM)    GI/Hepatic Neg liver ROS,GERD  Medicated,,Barrett's esophagus    Endo/Other  negative endocrine ROS    Renal/GU negative Renal ROS  negative genitourinary   Musculoskeletal   Abdominal   Peds  Hematology negative hematology ROS (+)   Anesthesia Other Findings Past Medical History: No date: Anxiety No date: Asthma No date: Barrett's esophagus No date: BPH (benign prostatic hyperplasia) No date: COPD (chronic obstructive pulmonary disease) (HCC) No date: DDD (degenerative disc disease), lumbar No date: GERD (gastroesophageal reflux disease) No date: Headache No date: Hemorrhoids No date: Hyperlipidemia No date: Hyperlipidemia No date: Hypertension No date: Insomnia No date: Neuropathy No date: Osteoarthritis No date: Tobacco use No date: Vitamin D deficiency  Past Surgical History: No date: APPENDECTOMY No date: BACK SURGERY     Comment:  injection 03/15/2015: ESOPHAGOGASTRODUODENOSCOPY; N/A     Comment:  Procedure: ESOPHAGOGASTRODUODENOSCOPY (EGD);  Surgeon:               Hulen Luster, MD;  Location: [facility] ENDOSCOPY;  Service:               Gastroenterology;  Laterality: N/A; 08/23/2015: ESOPHAGOGASTRODUODENOSCOPY (EGD)  WITH PROPOFOL; N/A     Comment:  Procedure: ESOPHAGOGASTRODUODENOSCOPY (EGD) WITH               PROPOFOL;  Surgeon: Hulen Luster, MD;  Location: [facility]               ENDOSCOPY;  Service: Gastroenterology;  Laterality: N/A;               Barrx Procedure 01/24/2016: ESOPHAGOGASTRODUODENOSCOPY (EGD) WITH PROPOFOL; N/A     Comment:  Procedure: ESOPHAGOGASTRODUODENOSCOPY (EGD) WITH               PROPOFOL;  Surgeon: Hulen Luster, MD;  Location: [facility]               ENDOSCOPY;  Service: Gastroenterology;  Laterality: N/A; No date: HERNIA REPAIR No date: JOINT REPLACEMENT; Left     Comment:  Partial Knee Replacement No date: KNEE ARTHROSCOPY 06/30/2017: PAROTIDECTOMY; Left     Comment:  Procedure: PAROTIDECTOMY;  Surgeon: Beverly Gust,               MD;  Location: [facility] ORS;  Service: ENT;  Laterality:               Left; No date: reattachment of finger; Left     Comment:  little finger No date: salvia gland tumors removed No date: SUPERFICIAL PERONEAL NERVE RELEASE  BMI    Body Mass Index: 27.20 kg/m      Reproductive/Obstetrics negative OB ROS  Anesthesia Physical Anesthesia Plan  ASA: 3  Anesthesia Plan: General   Post-op Pain Management: Minimal or no pain anticipated   Induction: Intravenous  PONV Risk Score and Plan: Propofol infusion and TIVA  Airway Management Planned: Natural Airway and Nasal Cannula  Additional Equipment:   Intra-op Plan:   Post-operative Plan:   Informed Consent: I have reviewed the patients History and Physical, chart, labs and discussed the procedure including the risks, benefits and alternatives for the proposed anesthesia with the patient or authorized representative who has indicated his/her understanding and acceptance.     Dental Advisory Given  Plan Discussed with: Anesthesiologist, CRNA and Surgeon  Anesthesia Plan Comments: (Patient consented for risks of anesthesia including but not  limited to:  - adverse reactions to medications - risk of airway placement if required - damage to eyes, teeth, lips or other oral mucosa - nerve damage due to positioning  - sore throat or hoarseness - Damage to heart, brain, nerves, lungs, other parts of body or loss of life  Patient voiced understanding.)        Anesthesia Quick Evaluation

## 2022-09-07 ENCOUNTER — Encounter: Payer: Self-pay | Admitting: Podiatry

## 2022-09-09 ENCOUNTER — Encounter: Payer: Self-pay | Admitting: *Deleted

## 2022-09-09 LAB — SURGICAL PATHOLOGY

## 2022-09-11 LAB — AEROBIC/ANAEROBIC CULTURE W GRAM STAIN (SURGICAL/DEEP WOUND): Gram Stain: NONE SEEN

## 2022-12-03 ENCOUNTER — Emergency Department: Payer: No Typology Code available for payment source

## 2022-12-03 ENCOUNTER — Encounter: Payer: Self-pay | Admitting: Intensive Care

## 2022-12-03 ENCOUNTER — Encounter
Admission: EM | Disposition: A | Discharge status: Home / Self Care | Payer: Self-pay | Source: Home / Self Care | Attending: Emergency Medicine

## 2022-12-03 ENCOUNTER — Inpatient Hospital Stay: Payer: No Typology Code available for payment source | Admitting: General Practice

## 2022-12-03 ENCOUNTER — Observation Stay
Admission: EM | Admit: 2022-12-03 | Discharge: 2022-12-04 | Disposition: A | Discharge status: Home / Self Care | Payer: No Typology Code available for payment source | Attending: Obstetrics and Gynecology | Admitting: Obstetrics and Gynecology

## 2022-12-03 ENCOUNTER — Other Ambulatory Visit: Payer: Self-pay

## 2022-12-03 DIAGNOSIS — K921 Melena: Secondary | ICD-10-CM | POA: Insufficient documentation

## 2022-12-03 DIAGNOSIS — K922 Gastrointestinal hemorrhage, unspecified: Secondary | ICD-10-CM | POA: Diagnosis not present

## 2022-12-03 DIAGNOSIS — J45909 Unspecified asthma, uncomplicated: Secondary | ICD-10-CM | POA: Diagnosis not present

## 2022-12-03 DIAGNOSIS — Z79899 Other long term (current) drug therapy: Secondary | ICD-10-CM | POA: Diagnosis not present

## 2022-12-03 DIAGNOSIS — K2289 Other specified disease of esophagus: Secondary | ICD-10-CM | POA: Diagnosis not present

## 2022-12-03 DIAGNOSIS — K449 Diaphragmatic hernia without obstruction or gangrene: Secondary | ICD-10-CM | POA: Diagnosis not present

## 2022-12-03 DIAGNOSIS — F17210 Nicotine dependence, cigarettes, uncomplicated: Secondary | ICD-10-CM | POA: Diagnosis not present

## 2022-12-03 DIAGNOSIS — E876 Hypokalemia: Secondary | ICD-10-CM | POA: Insufficient documentation

## 2022-12-03 DIAGNOSIS — R0789 Other chest pain: Secondary | ICD-10-CM

## 2022-12-03 DIAGNOSIS — K649 Unspecified hemorrhoids: Secondary | ICD-10-CM | POA: Diagnosis present

## 2022-12-03 DIAGNOSIS — Z89422 Acquired absence of other left toe(s): Secondary | ICD-10-CM | POA: Diagnosis not present

## 2022-12-03 DIAGNOSIS — Z96652 Presence of left artificial knee joint: Secondary | ICD-10-CM | POA: Diagnosis not present

## 2022-12-03 DIAGNOSIS — E785 Hyperlipidemia, unspecified: Secondary | ICD-10-CM | POA: Diagnosis not present

## 2022-12-03 DIAGNOSIS — Z7952 Long term (current) use of systemic steroids: Secondary | ICD-10-CM | POA: Insufficient documentation

## 2022-12-03 DIAGNOSIS — N400 Enlarged prostate without lower urinary tract symptoms: Secondary | ICD-10-CM | POA: Diagnosis not present

## 2022-12-03 DIAGNOSIS — K2270 Barrett's esophagus without dysplasia: Secondary | ICD-10-CM | POA: Diagnosis present

## 2022-12-03 DIAGNOSIS — K254 Chronic or unspecified gastric ulcer with hemorrhage: Principal | ICD-10-CM | POA: Insufficient documentation

## 2022-12-03 DIAGNOSIS — J449 Chronic obstructive pulmonary disease, unspecified: Secondary | ICD-10-CM | POA: Diagnosis present

## 2022-12-03 DIAGNOSIS — R1013 Epigastric pain: Secondary | ICD-10-CM | POA: Diagnosis present

## 2022-12-03 DIAGNOSIS — I10 Essential (primary) hypertension: Secondary | ICD-10-CM | POA: Diagnosis not present

## 2022-12-03 DIAGNOSIS — K259 Gastric ulcer, unspecified as acute or chronic, without hemorrhage or perforation: Secondary | ICD-10-CM | POA: Insufficient documentation

## 2022-12-03 DIAGNOSIS — Z87891 Personal history of nicotine dependence: Secondary | ICD-10-CM

## 2022-12-03 HISTORY — PX: ESOPHAGOGASTRODUODENOSCOPY (EGD) WITH PROPOFOL: SHX5813

## 2022-12-03 LAB — GLUCOSE, CAPILLARY: Glucose-Capillary: 126 mg/dL — ABNORMAL HIGH (ref 70–99)

## 2022-12-03 LAB — TROPONIN I (HIGH SENSITIVITY)
Troponin I (High Sensitivity): 9 ng/L (ref <18)
Troponin I (High Sensitivity): 9 ng/L (ref <18)

## 2022-12-03 LAB — CBC
HCT: 43.8 % (ref 39.0–52.0)
Hemoglobin: 14.6 g/dL (ref 13.0–17.0)
MCH: 32.9 pg (ref 26.0–34.0)
MCHC: 33.3 g/dL (ref 30.0–36.0)
MCV: 98.6 fL (ref 80.0–100.0)
Platelets: 249 10*3/uL (ref 150–400)
RBC: 4.44 MIL/uL (ref 4.22–5.81)
RDW: 14 % (ref 11.5–15.5)
WBC: 11.7 10*3/uL — ABNORMAL HIGH (ref 4.0–10.5)
nRBC: 0 % (ref 0.0–0.2)

## 2022-12-03 LAB — BASIC METABOLIC PANEL
Anion gap: 9 (ref 5–15)
BUN: 31 mg/dL — ABNORMAL HIGH (ref 8–23)
CO2: 27 mmol/L (ref 22–32)
Calcium: 9.3 mg/dL (ref 8.9–10.3)
Chloride: 99 mmol/L (ref 98–111)
Creatinine, Ser: 0.94 mg/dL (ref 0.61–1.24)
GFR, Estimated: >60 mL/min (ref >60)
Glucose, Bld: 138 mg/dL — ABNORMAL HIGH (ref 70–99)
Potassium: 3.3 mmol/L — ABNORMAL LOW (ref 3.5–5.1)
Sodium: 135 mmol/L (ref 135–145)

## 2022-12-03 LAB — HEPATIC FUNCTION PANEL
ALT: 24 U/L (ref 0–44)
AST: 26 U/L (ref 15–41)
Albumin: 3.8 g/dL (ref 3.5–5.0)
Alkaline Phosphatase: 67 U/L (ref 38–126)
Bilirubin, Direct: 0.1 mg/dL (ref 0.0–0.2)
Indirect Bilirubin: 0.5 mg/dL (ref 0.3–0.9)
Total Bilirubin: 0.6 mg/dL (ref 0.3–1.2)
Total Protein: 6.6 g/dL (ref 6.5–8.1)

## 2022-12-03 LAB — PROTIME-INR
INR: 1 (ref 0.8–1.2)
Prothrombin Time: 13.1 seconds (ref 11.4–15.2)

## 2022-12-03 LAB — MAGNESIUM: Magnesium: 2 mg/dL (ref 1.7–2.4)

## 2022-12-03 LAB — TYPE AND SCREEN
ABO/RH(D): O POS
Antibody Screen: NEGATIVE

## 2022-12-03 LAB — HEMOGLOBIN AND HEMATOCRIT, BLOOD
HCT: 38 % — ABNORMAL LOW (ref 39.0–52.0)
HCT: 37.3 % — ABNORMAL LOW (ref 39.0–52.0)
Hemoglobin: 12.8 g/dL — ABNORMAL LOW (ref 13.0–17.0)
Hemoglobin: 12.4 g/dL — ABNORMAL LOW (ref 13.0–17.0)

## 2022-12-03 LAB — LIPASE, BLOOD: Lipase: 27 U/L (ref 11–51)

## 2022-12-03 SURGERY — ESOPHAGOGASTRODUODENOSCOPY (EGD) WITH PROPOFOL
Anesthesia: General

## 2022-12-03 MED ORDER — PROPOFOL 10 MG/ML IV BOLUS
INTRAVENOUS | Status: DC | PRN
  Administered 2022-12-03: 10 mg via INTRAVENOUS
  Administered 2022-12-03 (×2): 20 mg via INTRAVENOUS
  Administered 2022-12-03: 90 mg via INTRAVENOUS

## 2022-12-03 MED ORDER — SODIUM CHLORIDE 0.9 % IV BOLUS
1000.0000 mL | Freq: Once | INTRAVENOUS | Status: AC
  Administered 2022-12-03: 1000 mL via INTRAVENOUS

## 2022-12-03 MED ORDER — KCL IN DEXTROSE-NACL 10-5-0.45 MEQ/L-%-% IV SOLN
INTRAVENOUS | Status: DC
  Filled 2022-12-03 (×3): qty 1000

## 2022-12-03 MED ORDER — ONDANSETRON HCL 4 MG/2ML IJ SOLN
4.0000 mg | Freq: Once | INTRAMUSCULAR | Status: AC
  Administered 2022-12-03: 4 mg via INTRAVENOUS
  Filled 2022-12-03: qty 2

## 2022-12-03 MED ORDER — PREGABALIN 75 MG PO CAPS
150.0000 mg | ORAL_CAPSULE | Freq: Three times a day (TID) | ORAL | Status: DC
  Administered 2022-12-03 – 2022-12-04 (×3): 150 mg via ORAL
  Filled 2022-12-03 (×3): qty 2

## 2022-12-03 MED ORDER — PROPOFOL 10 MG/ML IV BOLUS
INTRAVENOUS | Status: AC
  Filled 2022-12-03: qty 20

## 2022-12-03 MED ORDER — FLUTICASONE FUROATE-VILANTEROL 100-25 MCG/ACT IN AEPB: 1.0000 | INHALATION_SPRAY | Freq: Every day | RESPIRATORY_TRACT | Status: DC

## 2022-12-03 MED ORDER — NORTRIPTYLINE HCL 10 MG PO CAPS
20.0000 mg | ORAL_CAPSULE | Freq: Every day | ORAL | Status: DC
  Administered 2022-12-03: 20 mg via ORAL
  Filled 2022-12-03: qty 2

## 2022-12-03 MED ORDER — DULOXETINE HCL 30 MG PO CPEP
60.0000 mg | ORAL_CAPSULE | Freq: Every day | ORAL | Status: DC
  Administered 2022-12-04: 60 mg via ORAL
  Filled 2022-12-03: qty 2

## 2022-12-03 MED ORDER — LIDOCAINE HCL (CARDIAC) PF 100 MG/5ML IV SOSY
PREFILLED_SYRINGE | INTRAVENOUS | Status: DC | PRN
  Administered 2022-12-03: 100 mg via INTRAVENOUS

## 2022-12-03 MED ORDER — SODIUM CHLORIDE 0.9 % IV SOLN: INTRAVENOUS | Status: DC

## 2022-12-03 MED ORDER — MORPHINE SULFATE (PF) 4 MG/ML IV SOLN
4.0000 mg | Freq: Once | INTRAVENOUS | Status: AC
  Administered 2022-12-03: 4 mg via INTRAVENOUS
  Filled 2022-12-03: qty 1

## 2022-12-03 MED ORDER — PANTOPRAZOLE INFUSION (NEW) - SIMPLE MED
8.0000 mg/h | INTRAVENOUS | Status: DC
  Administered 2022-12-03 (×2): 8 mg/h via INTRAVENOUS
  Filled 2022-12-03 (×3): qty 100

## 2022-12-03 MED ORDER — ROPINIROLE HCL 1 MG PO TABS: 0.5000 mg | ORAL_TABLET | Freq: Four times a day (QID) | ORAL | Status: DC | PRN

## 2022-12-03 MED ORDER — UMECLIDINIUM BROMIDE 62.5 MCG/ACT IN AEPB: 1.0000 | INHALATION_SPRAY | Freq: Every day | RESPIRATORY_TRACT | Status: DC

## 2022-12-03 MED ORDER — FLUTICASONE PROPIONATE 50 MCG/ACT NA SUSP: 2.0000 | Freq: Two times a day (BID) | NASAL | Status: DC | PRN

## 2022-12-03 MED ORDER — ALBUTEROL SULFATE (2.5 MG/3ML) 0.083% IN NEBU: 3.0000 mL | INHALATION_SOLUTION | Freq: Four times a day (QID) | RESPIRATORY_TRACT | Status: DC | PRN

## 2022-12-03 MED ORDER — PANTOPRAZOLE 80MG IVPB - SIMPLE MED
80.0000 mg | Freq: Once | INTRAVENOUS | Status: AC
  Administered 2022-12-03: 80 mg via INTRAVENOUS
  Filled 2022-12-03: qty 100

## 2022-12-03 MED ORDER — ONDANSETRON HCL 4 MG/2ML IJ SOLN: 4.0000 mg | Freq: Four times a day (QID) | INTRAMUSCULAR | Status: DC | PRN

## 2022-12-03 MED ORDER — ZOLPIDEM TARTRATE 5 MG PO TABS
5.0000 mg | ORAL_TABLET | Freq: Every evening | ORAL | Status: DC | PRN
  Administered 2022-12-03: 5 mg via ORAL
  Filled 2022-12-03: qty 1

## 2022-12-03 MED ORDER — NICOTINE 21 MG/24HR TD PT24
21.0000 mg | MEDICATED_PATCH | Freq: Every day | TRANSDERMAL | Status: DC
  Administered 2022-12-04: 21 mg via TRANSDERMAL
  Filled 2022-12-03 (×2): qty 1

## 2022-12-03 MED ORDER — ROSUVASTATIN CALCIUM 10 MG PO TABS
10.0000 mg | ORAL_TABLET | Freq: Every day | ORAL | Status: DC
  Administered 2022-12-03: 10 mg via ORAL
  Filled 2022-12-03: qty 1

## 2022-12-03 MED ORDER — ACETAMINOPHEN 325 MG PO TABS
650.0000 mg | ORAL_TABLET | Freq: Once | ORAL | Status: AC
  Administered 2022-12-03: 650 mg via ORAL
  Filled 2022-12-03: qty 2

## 2022-12-03 MED ORDER — ONDANSETRON HCL 4 MG PO TABS: 4.0000 mg | ORAL_TABLET | Freq: Four times a day (QID) | ORAL | Status: DC | PRN

## 2022-12-03 MED ORDER — DEXTROSE-NACL 5-0.45 % IV SOLN: INTRAVENOUS | Status: DC

## 2022-12-03 NOTE — Assessment & Plan Note (Signed)
1 pack/day smoker Discussed smoking cessation at next Start nicotine patch

## 2022-12-03 NOTE — Assessment & Plan Note (Signed)
Positive epigastric pain and black tarry stools in setting of Mobic, prednisone and aspirin use Hemoglobin relatively stable at 14.6 at present though patient with several dark tarry stools Per ER physician, case discussed with on-call gastroenterologist Dr. Vicente Males Will plan for endoscopy IV PPI Hold offending agents Trend hemoglobin Transfuse for hemoglobin less than 7 Follow closely

## 2022-12-03 NOTE — Transfer of Care (Signed)
Immediate Anesthesia Transfer of Care Note  Patient: Anthony Bender  Procedure(s) Performed: ESOPHAGOGASTRODUODENOSCOPY (EGD) WITH PROPOFOL  Patient Location: PACU  Anesthesia Type:General  Level of Consciousness: awake  Airway & Oxygen Therapy: Patient Spontanous Breathing  Post-op Assessment: Report given to RN and Post -op Vital signs reviewed and stable  Post vital signs: Reviewed and stable  Last Vitals:  Vitals Value Taken Time  BP 92/62 12/03/22 1411  Temp 35.9 C 12/03/22 1410  Pulse 82 12/03/22 1412  Resp 20 12/03/22 1412  SpO2 97 % 12/03/22 1412  Vitals shown include unvalidated device data.  Last Pain:  Vitals:   12/03/22 1410  TempSrc: Temporal  PainSc: Asleep         Complications: No notable events documented.

## 2022-12-03 NOTE — Assessment & Plan Note (Signed)
Stable from a respiratory standpoint No hypoxia or increased work of breathing Continue home respiratory regimen including Trelegy and as needed albuterol Follow

## 2022-12-03 NOTE — Assessment & Plan Note (Signed)
BP stable Titrate home regimen 

## 2022-12-03 NOTE — ED Triage Notes (Signed)
Patient c/o black stools that started yesterday. Also reports chills, lower back pain, and possible indigestion that feels like chest pain.

## 2022-12-03 NOTE — Assessment & Plan Note (Signed)
K 3.3  Replete  Check Mg level  Follow

## 2022-12-03 NOTE — Op Note (Signed)
[facility] Gastroenterology Patient Name: Anthony Bender Procedure Date: 12/03/2022 1:48 PM MRN: [id number] Account #: [id number] Date of Birth: [birth year] Admit Type: Inpatient Age: 67 Room: [facility] ENDO ROOM 3 Gender: Male Note Status: Finalized Instrument Name: Upper Endoscope [id number] Procedure:             Upper GI endoscopy Indications:           Melena Providers:             Jonathon Bellows MD, MD Referring MD:          Ashley Jacobs, MD Medicines:             Monitored Anesthesia Care Complications:         No immediate complications. Procedure:             Pre-Anesthesia Assessment:                        - Prior to the procedure, a History and Physical was                         performed, and patient medications, allergies and                         sensitivities were reviewed. The patient's tolerance                         of previous anesthesia was reviewed.                        - The risks and benefits of the procedure and the                         sedation options and risks were discussed with the                         patient. All questions were answered and informed                         consent was obtained.                        - The risks and benefits of the procedure and the                         sedation options and risks were discussed with the                         patient. All questions were answered and informed                         consent was obtained.                        - ASA Grade Assessment: II - A patient with mild                         systemic disease.                        After obtaining informed consent,  the endoscope was                         passed under direct vision. Throughout the procedure,                         the patient's blood pressure, pulse, and oxygen                         saturations were monitored continuously. The Endoscope                         was introduced through the mouth,  and advanced to the                         third part of duodenum. The upper GI endoscopy was                         accomplished with ease. The patient tolerated the                         procedure well. Findings:      The examined duodenum was normal.      Salmon-colored mucosa was present. The maximum longitudinal extent of       these esophageal mucosal changes was 5 cm in length. Biopsies not taken       today as he has a gi bleed      Two non-bleeding superficial gastric ulcers with a clean ulcer base       (Forrest Class III) were found on the greater curvature of the stomach.       The largest lesion was 8 mm in largest dimension.      A medium-sized hiatal hernia was present.      The exam was otherwise without abnormality.      The cardia and gastric fundus were normal on retroflexion. Impression:            - Normal examined duodenum.                        - Salmon-colored mucosa.                        - Non-bleeding gastric ulcers with a clean ulcer base                         (Forrest Class III).                        - Medium-sized hiatal hernia.                        - The examination was otherwise normal.                        - No specimens collected. Recommendation:        - Return patient to hospital ward for ongoing care.                        - Clear liquid diet today.                        -  No ibuprofen, naproxen, or other non-steroidal                         anti-inflammatory drugs.                        - Use Prilosec (omeprazole) 40 mg PO BID for 3 months.                        - Repeat upper endoscopy in 2 months to evaluate the                         response to therapy.                        - Home tomorow if hb stable Procedure Code(s):     --- Professional ---                        [phone], Esophagogastroduodenoscopy, flexible,                         transoral; diagnostic, including collection of                         specimen(s) by  brushing or washing, when performed                         (separate procedure) Diagnosis Code(s):     --- Professional ---                        K22.89, Other specified disease of esophagus                        K25.9, Gastric ulcer, unspecified as acute or chronic,                         without hemorrhage or perforation                        K44.9, Diaphragmatic hernia without obstruction or                         gangrene                        K92.1, Melena (includes Hematochezia) CPT copyright 2022 American Medical Association. All rights reserved. The codes documented in this report are preliminary and upon coder review may  be revised to meet current compliance requirements. Jonathon Bellows, MD Jonathon Bellows MD, MD 12/03/2022 [id number] PM This report has been signed electronically. Number of Addenda: 0 Note Initiated On: 12/03/2022 1:48 PM Estimated Blood Loss:  Estimated blood loss: none.      [facility]

## 2022-12-03 NOTE — Assessment & Plan Note (Signed)
Noted active gastritis with recent NSAID and steroid use Pending upper endoscopy by gastroenterology

## 2022-12-03 NOTE — H&P (Addendum)
History and Physical    Patient: Anthony Bender [id number] DOB: 06-25-1956 DOA: 12/03/2022 DOS: the patient was seen and examined on 12/03/2022 PCP: Ashley Jacobs, MD  Patient coming from: Home  Chief Complaint:  Chief Complaint  Patient presents with   Melena   Chest Pain   HPI: Anthony Bender is a 67 y.o. male with medical history significant of Barrett's esophagus, COPD, tobacco abuse, hypertension Hyperlipidemia, neuropathy presenting with upper GI bleed.  Patient reports worsening epigastric pain over the past 3 to 4 days with new onset recurring black and tarry stools over the past 24 hours or so.  Patient states he was seen for a low back strain last week.  Was prescribed Mobic as well as prednisone for treatment.  Has been compliant with regimen.  Symptoms started within the first 3 to 5 days of treatment.  No fevers or chills.  Patient denies any prior episodes like this in the past.  No chest pain.  No shortness of breath.  Pain predominantly in the epigastric region.  No reported vomiting.  Does not report gross diarrhea but does report several bowel movements that were significantly black and tarry.  1 pack/day smoker.  Denies any active tobacco use.  Does use aspirin at home.  No hemiparesis or confusion.  Last endoscopy was several years ago which showed Barrett's esophagus per patient. Presented to the ER Tmax 99.2, BP relatively stable.  Satting well on room air.  White count 11.7, hemoglobin 14.6.  Potassium 3.3, Review of Systems: As mentioned in the history of present illness. All other systems reviewed and are negative. Past Medical History:  Diagnosis Date   Anxiety    Asthma    Barrett's esophagus    BPH (benign prostatic hyperplasia)    COPD (chronic obstructive pulmonary disease) (HCC)    DDD (degenerative disc disease), lumbar    GERD (gastroesophageal reflux disease)    Headache    Hemorrhoids    Hyperlipidemia    Hyperlipidemia    Hypertension     Insomnia    Neuropathy    Osteoarthritis    Tobacco use    Vitamin D deficiency    Past Surgical History:  Procedure Laterality Date   AMPUTATION TOE Left 09/06/2022   Procedure: AMPUTATION TOE;  Surgeon: Samara Deist, DPM;  Location: [facility] ORS;  Service: Podiatry;  Laterality: Left;   APPENDECTOMY     BACK SURGERY     injection   ESOPHAGOGASTRODUODENOSCOPY N/A 03/15/2015   Procedure: ESOPHAGOGASTRODUODENOSCOPY (EGD);  Surgeon: Hulen Luster, MD;  Location: [facility] ENDOSCOPY;  Service: Gastroenterology;  Laterality: N/A;   ESOPHAGOGASTRODUODENOSCOPY (EGD) WITH PROPOFOL N/A 08/23/2015   Procedure: ESOPHAGOGASTRODUODENOSCOPY (EGD) WITH PROPOFOL;  Surgeon: Hulen Luster, MD;  Location: [facility] ENDOSCOPY;  Service: Gastroenterology;  Laterality: N/A;   Barrx Procedure   ESOPHAGOGASTRODUODENOSCOPY (EGD) WITH PROPOFOL N/A 01/24/2016   Procedure: ESOPHAGOGASTRODUODENOSCOPY (EGD) WITH PROPOFOL;  Surgeon: Hulen Luster, MD;  Location: [facility] ENDOSCOPY;  Service: Gastroenterology;  Laterality: N/A;   HERNIA REPAIR     JOINT REPLACEMENT Left    Partial Knee Replacement   KNEE ARTHROSCOPY     PAROTIDECTOMY Left 06/30/2017   Procedure: PAROTIDECTOMY;  Surgeon: Beverly Gust, MD;  Location: [facility] ORS;  Service: ENT;  Laterality: Left;   reattachment of finger Left    little finger   salvia gland tumors removed     SUPERFICIAL PERONEAL NERVE RELEASE     Social History:  reports that he has been smoking cigarettes.  He has a 88.50 pack-year smoking history. He has never used smokeless tobacco. He reports current alcohol use. He reports current drug use. Drug: Marijuana.  Allergies  Allergen Reactions   Yellow Jacket Venom [Bee Venom] Anaphylaxis and Shortness Of Breath   Codeine Itching   Metaxalone Swelling   Venlafaxine Other (See Comments)    Loss of appetite, jittery   Zyban [Bupropion] Other (See Comments)    hallucinations   Fluoxetine Anxiety    anger    Family History  Problem Relation Age of Onset    Diabetes Mother    Hypertension Mother    Hypertension Father     Prior to Admission medications   Medication Sig Start Date End Date Taking? Authorizing Provider  DULoxetine (CYMBALTA) 60 MG capsule Take 60 mg by mouth daily. 11/07/22 11/02/23 Yes [provider]  meloxicam (MOBIC) 7.5 MG tablet Take 7.5 mg by mouth daily. 11/26/22  Yes [provider]  predniSONE (DELTASONE) 20 MG tablet Take 20 mg by mouth 2 (two) times daily. 11/26/22  Yes [provider]  albuterol (PROVENTIL HFA;VENTOLIN HFA) 108 (90 Base) MCG/ACT inhaler Inhale into the lungs every 6 (six) hours as needed for wheezing or shortness of breath.    [provider]  DULoxetine (CYMBALTA) 30 MG capsule Take 1 capsule (30 mg total) by mouth 2 (two) times daily. Patient not taking: Reported on 12/03/2022 06/01/18   Ursula Alert, MD  esomeprazole (NEXIUM) 40 MG capsule Take 40 mg by mouth 2 (two) times daily before a meal.  02/23/15   [provider]  fluticasone (FLONASE) 50 MCG/ACT nasal spray Place 2 sprays into both nostrils 2 (two) times daily.  01/02/15   [provider]  HYDROcodone-acetaminophen (NORCO/VICODIN) 5-325 MG tablet Take 1 tablet by mouth every 4 (four) hours as needed for moderate pain. 09/06/22 09/06/23  Lorella Nimrod, MD  LYRICA 150 MG capsule Take 150 mg by mouth 3 (three) times daily.  01/02/15   [provider]  mupirocin ointment (BACTROBAN) 2 % Apply 1 Application topically daily. 09/02/22   [provider]  nortriptyline (PAMELOR) 10 MG capsule Take 20 mg by mouth at bedtime. 04/23/22   [provider]  rOPINIRole (REQUIP) 0.5 MG tablet Take 0.5 mg by mouth 4 (four) times daily as needed.    [provider]  rosuvastatin (CRESTOR) 10 MG tablet Take 10 mg by mouth at bedtime.    [provider]  SUMAtriptan (IMITREX) 25 MG tablet Take 25 mg by mouth every 2 (two) hours as needed for migraine or headache. 04/23/22    [provider]  telmisartan (MICARDIS) 80 MG tablet Take 80 mg by mouth daily. 06/03/21   [provider]  TRELEGY ELLIPTA 100-62.5-25 MCG/ACT AEPB Inhale 1 puff into the lungs daily.    [provider]  varenicline (CHANTIX) 1 MG tablet Take 1 mg by mouth 2 (two) times daily.    [provider]  zolpidem (AMBIEN) 10 MG tablet Take 0.5 tablets (5 mg total) by mouth at bedtime. PATIENT HAS SUPPLIES 01/12/18   Ursula Alert, MD    Physical Exam: Vitals:   12/03/22 0806 12/03/22 0807 12/03/22 0815  BP:  116/89 124/82  Pulse:  (!) 124 100  Resp:  (!) 22 20  Temp:  99.2 F (37.3 C)   TempSrc:  Oral   SpO2:  99% 99%  Weight: 90.7 kg    Height: '5\' 10"'$  (1.778 m)     Physical Exam Constitutional:  General: He is not in acute distress.    Appearance: He is normal weight.  HENT:     Head: Normocephalic and atraumatic.  Cardiovascular:     Rate and Rhythm: Normal rate and regular rhythm.  Pulmonary:     Effort: Pulmonary effort is normal.  Abdominal:     General: Abdomen is flat. Bowel sounds are normal.     Comments: + epigastric TTP    Musculoskeletal:        General: Normal range of motion.     Cervical back: Normal range of motion.  Skin:    General: Skin is warm.  Neurological:     General: No focal deficit present.  Psychiatric:        Mood and Affect: Mood normal.     Data Reviewed:  There are no new results to review at this time. DG Chest 2 View CLINICAL DATA:  Chest pain.  EXAM: CHEST - 2 VIEW  COMPARISON:  April 27, 2014.  FINDINGS: The heart size and mediastinal contours are within normal limits. Both lungs are clear. The visualized skeletal structures are unremarkable.  IMPRESSION: No active cardiopulmonary disease.  Electronically Signed   By: Marijo Conception M.D.   On: 12/03/2022 [id number]  Last metabolic panel Lab Results  Component Value Date   GLUCOSE 138 (H) 12/03/2022   NA 135 12/03/2022   K 3.3 (L)  12/03/2022   CL 99 12/03/2022   CO2 27 12/03/2022   BUN 31 (H) 12/03/2022   CREATININE 0.94 12/03/2022   GFRNONAA >60 12/03/2022   CALCIUM 9.3 12/03/2022   PROT 6.6 12/03/2022   ALBUMIN 3.8 12/03/2022   BILITOT 0.6 12/03/2022   ALKPHOS 67 12/03/2022   AST 26 12/03/2022   ALT 24 12/03/2022   ANIONGAP 9 12/03/2022    Lab Results  Component Value Date   WBC 11.7 (H) 12/03/2022   HGB 14.6 12/03/2022   HCT 43.8 12/03/2022   MCV 98.6 12/03/2022   PLT 249 12/03/2022    Assessment and Plan: * UGIB (upper gastrointestinal bleed) Positive epigastric pain and black tarry stools in setting of Mobic, prednisone and aspirin use Hemoglobin relatively stable at 14.6 at present though patient with several dark tarry stools Per ER physician, case discussed with on-call gastroenterologist Dr. Vicente Males Will plan for endoscopy IV PPI Hold offending agents Trend hemoglobin Transfuse for hemoglobin less than 7 Follow closely  Hypokalemia K 3.3  Replete  Check Mg level  Follow  Hypertension BP stable Titrate home regimen  Personal history of tobacco use, presenting hazards to health 1 pack/day smoker Discussed smoking cessation at next Start nicotine patch  Barrett's esophagus Noted active gastritis with recent NSAID and steroid use Pending upper endoscopy by gastroenterology  COPD (chronic obstructive pulmonary disease) ([place]) Stable from a respiratory standpoint No hypoxia or increased work of breathing Continue home respiratory regimen including Trelegy and as needed albuterol Follow      Advance Care Planning:   Code Status: Full Code   Consults: Gastroenterology with Dr Vicente Males   Family Communication: Wife Anthony Bender at the bedside   Severity of Illness: The appropriate patient status for this patient is INPATIENT. Inpatient status is judged to be reasonable and necessary in order to provide the required intensity of service to ensure the patient's safety. The patient's  presenting symptoms, physical exam findings, and initial radiographic and laboratory data in the context of their chronic comorbidities is felt to place them at high risk for further clinical deterioration.  Furthermore, it is not anticipated that the patient will be medically stable for discharge from the hospital within 2 midnights of admission.   * I certify that at the point of admission it is my clinical judgment that the patient will require inpatient hospital care spanning beyond 2 midnights from the point of admission due to high intensity of service, high risk for further deterioration and high frequency of surveillance required.*  Author: Deneise Lever, MD 12/03/2022 11:11 AM  For on call review [email or web address].

## 2022-12-03 NOTE — Anesthesia Preprocedure Evaluation (Signed)
Anesthesia Evaluation  Patient identified by MRN, date of birth, ID band Patient awake    Reviewed: Allergy & Precautions, NPO status , Patient's Chart, lab work & pertinent test results  History of Anesthesia Complications Negative for: history of anesthetic complications  Airway Mallampati: III  TM Distance: >3 FB Neck ROM: full    Dental  (+) Edentulous Upper, Edentulous Lower   Pulmonary asthma , COPD,  COPD inhaler, Current Smoker and Patient abstained from smoking.   Pulmonary exam normal        Cardiovascular hypertension, On Medications Normal cardiovascular exam     Neuro/Psych  PSYCHIATRIC DISORDERS Anxiety      Neuromuscular disease (neuropathy of unknown origin, denies DM)    GI/Hepatic Neg liver ROS,GERD  Medicated,,Barrett's esophagus    Endo/Other  negative endocrine ROS    Renal/GU negative Renal ROS  negative genitourinary   Musculoskeletal   Abdominal   Peds  Hematology negative hematology ROS (+)   Anesthesia Other Findings Past Medical History: No date: Anxiety No date: Asthma No date: Barrett's esophagus No date: BPH (benign prostatic hyperplasia) No date: COPD (chronic obstructive pulmonary disease) (HCC) No date: DDD (degenerative disc disease), lumbar No date: GERD (gastroesophageal reflux disease) No date: Headache No date: Hemorrhoids No date: Hyperlipidemia No date: Hyperlipidemia No date: Hypertension No date: Insomnia No date: Neuropathy No date: Osteoarthritis No date: Tobacco use No date: Vitamin D deficiency  Past Surgical History: No date: APPENDECTOMY No date: BACK SURGERY     Comment:  injection 03/15/2015: ESOPHAGOGASTRODUODENOSCOPY; N/A     Comment:  Procedure: ESOPHAGOGASTRODUODENOSCOPY (EGD);  Surgeon:               Hulen Luster, MD;  Location: [facility] ENDOSCOPY;  Service:               Gastroenterology;  Laterality: N/A; 08/23/2015: ESOPHAGOGASTRODUODENOSCOPY (EGD)  WITH PROPOFOL; N/A     Comment:  Procedure: ESOPHAGOGASTRODUODENOSCOPY (EGD) WITH               PROPOFOL;  Surgeon: Hulen Luster, MD;  Location: [facility]               ENDOSCOPY;  Service: Gastroenterology;  Laterality: N/A;               [place] Procedure 01/24/2016: ESOPHAGOGASTRODUODENOSCOPY (EGD) WITH PROPOFOL; N/A     Comment:  Procedure: ESOPHAGOGASTRODUODENOSCOPY (EGD) WITH               PROPOFOL;  Surgeon: Hulen Luster, MD;  Location: [facility]               ENDOSCOPY;  Service: Gastroenterology;  Laterality: N/A; No date: HERNIA REPAIR No date: JOINT REPLACEMENT; Left     Comment:  Partial Knee Replacement No date: KNEE ARTHROSCOPY 06/30/2017: PAROTIDECTOMY; Left     Comment:  Procedure: PAROTIDECTOMY;  Surgeon: Beverly Gust,               MD;  Location: [facility] ORS;  Service: ENT;  Laterality:               Left; No date: reattachment of finger; Left     Comment:  little finger No date: salvia gland tumors removed No date: SUPERFICIAL PERONEAL NERVE RELEASE  BMI    Body Mass Index: 27.20 kg/m      Reproductive/Obstetrics negative OB ROS  Anesthesia Physical Anesthesia Plan  ASA: 3  Anesthesia Plan: General   Post-op Pain Management: Minimal or no pain anticipated   Induction: Intravenous  PONV Risk Score and Plan: Propofol infusion, TIVA and Ondansetron  Airway Management Planned: Natural Airway and Nasal Cannula  Additional Equipment:   Intra-op Plan:   Post-operative Plan:   Informed Consent: I have reviewed the patients History and Physical, chart, labs and discussed the procedure including the risks, benefits and alternatives for the proposed anesthesia with the patient or authorized representative who has indicated his/her understanding and acceptance.     Dental Advisory Given  Plan Discussed with: Anesthesiologist, CRNA and Surgeon  Anesthesia Plan Comments: (Patient consented for risks of anesthesia  including but not limited to:  - adverse reactions to medications - risk of airway placement if required - damage to eyes, teeth, lips or other oral mucosa - nerve damage due to positioning  - sore throat or hoarseness - Damage to heart, brain, nerves, lungs, other parts of body or loss of life  Patient voiced understanding.)        Anesthesia Quick Evaluation
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
## 2022-12-03 NOTE — ED Notes (Signed)
Pt up to restroom, no assistance required. Ambulated to hallway bathroom.

## 2022-12-03 NOTE — Consult Note (Signed)
Jonathon Bellows , MD [address], [place], [place], [place], [address] [address] [place], [place], [place], [address] Phone: [phone]  Fax: [phone]  Consultation  Referring Provider:   Dr Ellender Hose Primary Care Physician:  Ashley Jacobs, MD Primary Gastroenterologist:  None          Reason for Consultation:     GI bleed  Date of Admission:  12/03/2022 Date of Consultation:  12/03/2022         HPI:   Anthony Bender is a 67 y.o. male presented to the [facility] room with dark stools and chest pain.  He had been started on Mobic for musculoskeletal pain last week and over the past 24 hours developed black stools with epigastric discomfort.  In the emergency room with hemoglobin of 14.6 g elevation in BUN/creatinine ratio with BUN of 31 creatinine 1.94.  LFTs were normal.  INR was normal.  History is that for the past 24 to 48 hours has had multiple tarry black bowel movements last 1 was short while back in the emergency room.  Denies any hematemesis.  Complains of epigastric pain going on for at least a week worse when he eats.  He has been taking Mobic for a week in addition has been taking Goody powders a few times a day for many years.  He is a smoker.  No other complaints. Past Medical History:  Diagnosis Date   Anxiety    Asthma    Barrett's esophagus    BPH (benign prostatic hyperplasia)    COPD (chronic obstructive pulmonary disease) (HCC)    DDD (degenerative disc disease), lumbar    GERD (gastroesophageal reflux disease)    Headache    Hemorrhoids    Hyperlipidemia    Hyperlipidemia    Hypertension    Insomnia    Neuropathy    Osteoarthritis    Tobacco use    Vitamin D deficiency     Past Surgical History:  Procedure Laterality Date   AMPUTATION TOE Left 09/06/2022   Procedure: AMPUTATION TOE;  Surgeon: Samara Deist, DPM;  Location: [facility] ORS;  Service: Podiatry;  Laterality: Left;   APPENDECTOMY     BACK SURGERY     injection   ESOPHAGOGASTRODUODENOSCOPY  N/A 03/15/2015   Procedure: ESOPHAGOGASTRODUODENOSCOPY (EGD);  Surgeon: Hulen Luster, MD;  Location: [facility] ENDOSCOPY;  Service: Gastroenterology;  Laterality: N/A;   ESOPHAGOGASTRODUODENOSCOPY (EGD) WITH PROPOFOL N/A 08/23/2015   Procedure: ESOPHAGOGASTRODUODENOSCOPY (EGD) WITH PROPOFOL;  Surgeon: Hulen Luster, MD;  Location: [facility] ENDOSCOPY;  Service: Gastroenterology;  Laterality: N/A;   [facility] Procedure   ESOPHAGOGASTRODUODENOSCOPY (EGD) WITH PROPOFOL N/A 01/24/2016   Procedure: ESOPHAGOGASTRODUODENOSCOPY (EGD) WITH PROPOFOL;  Surgeon: Hulen Luster, MD;  Location: [facility] ENDOSCOPY;  Service: Gastroenterology;  Laterality: N/A;   HERNIA REPAIR     JOINT REPLACEMENT Left    Partial Knee Replacement   KNEE ARTHROSCOPY     PAROTIDECTOMY Left 06/30/2017   Procedure: PAROTIDECTOMY;  Surgeon: Beverly Gust, MD;  Location: [facility] ORS;  Service: ENT;  Laterality: Left;   reattachment of finger Left    little finger   salvia gland tumors removed     SUPERFICIAL PERONEAL NERVE RELEASE      Prior to Admission medications   Medication Sig Start Date End Date Taking? Authorizing Provider  albuterol (PROVENTIL HFA;VENTOLIN HFA) 108 (90 Base) MCG/ACT inhaler Inhale into the lungs every 6 (six) hours as needed for wheezing or shortness of breath.    [provider]  DULoxetine (CYMBALTA) 30 MG capsule Take 1 capsule (30 mg total) by mouth 2 (two) times daily. 06/01/18   Ursula Alert, MD  esomeprazole (NEXIUM) 40 MG capsule Take 40 mg by mouth 2 (two) times daily before a meal.  02/23/15   [provider]  fluticasone (FLONASE) 50 MCG/ACT nasal spray Place 2 sprays into both nostrils 2 (two) times daily.  01/02/15   [provider]  HYDROcodone-acetaminophen (NORCO/VICODIN) 5-325 MG tablet Take 1 tablet by mouth every 4 (four) hours as needed for moderate pain. 09/06/22 09/06/23  Lorella Nimrod, MD  LYRICA 150 MG capsule Take 150 mg by mouth 3 (three) times daily.  01/02/15   [provider]   mupirocin ointment (BACTROBAN) 2 % Apply 1 Application topically daily. 09/02/22   [provider]  nortriptyline (PAMELOR) 10 MG capsule Take 20 mg by mouth at bedtime. 04/23/22   [provider]  rOPINIRole (REQUIP) 0.5 MG tablet Take 0.5 mg by mouth 4 (four) times daily as needed.    [provider]  rosuvastatin (CRESTOR) 10 MG tablet Take 10 mg by mouth at bedtime.    [provider]  SUMAtriptan (IMITREX) 25 MG tablet Take 25 mg by mouth every 2 (two) hours as needed for migraine or headache. 04/23/22   [provider]  telmisartan (MICARDIS) 80 MG tablet Take 80 mg by mouth daily. 06/03/21   [provider]  TRELEGY ELLIPTA 100-62.5-25 MCG/ACT AEPB Inhale 1 puff into the lungs daily.    [provider]  varenicline (CHANTIX) 1 MG tablet Take 1 mg by mouth 2 (two) times daily.    [provider]  zolpidem (AMBIEN) 10 MG tablet Take 0.5 tablets (5 mg total) by mouth at bedtime. PATIENT HAS SUPPLIES 01/12/18   Ursula Alert, MD    Family History  Problem Relation Age of Onset   Diabetes Mother    Hypertension Mother    Hypertension Father      Social History   Tobacco Use   Smoking status: Every Day    Packs/day: 1.50    Years: 59.00    Total pack years: 88.50    Types: Cigarettes   Smokeless tobacco: Never  Vaping Use   Vaping Use: Never used  Substance Use Topics   Alcohol use: Yes   Drug use: Yes    Types: Marijuana    Allergies as of 12/03/2022 - Review Complete 12/03/2022  Allergen Reaction Noted   Yellow jacket venom [bee venom] Anaphylaxis and Shortness Of Breath 06/19/2017   Codeine Itching 03/14/2015   Metaxalone Swelling 08/14/2019   Venlafaxine Other (See Comments) 04/28/2016   Zyban [bupropion] Other (See Comments) 03/14/2015   Fluoxetine Anxiety 04/30/2018    Review of Systems:    All systems reviewed and negative except where noted in HPI.   Physical Exam:  Vital signs in last 24  hours: Temp:  [99 F (37.2 C)-99.2 F (37.3 C)] 99 F (37.2 C) (02/28 1152) Pulse Rate:  [82-124] 82 (02/28 1152) Resp:  [18-22] 18 (02/28 1152) BP: (116-136)/(82-90) 136/90 (02/28 1152) SpO2:  [99 %-100 %] 99 % (02/28 1152) Weight:  [90.7 kg] 90.7 kg (02/28 0806) Last BM Date : 12/03/22 General:   Pleasant, cooperative in NAD Head:  Normocephalic and atraumatic. Eyes:   No icterus.   Conjunctiva pink. PERRLA. Ears:  Normal auditory acuity. Neck:  Supple; no masses or thyroidomegaly Lungs: Respirations even and unlabored. Lungs clear to auscultation bilaterally.   No wheezes, crackles,  or rhonchi.  Heart:  Regular rate and rhythm;  Without murmur, clicks, rubs or gallops Abdomen:  Soft, nondistended, nontender. Normal bowel sounds. No appreciable masses or hepatomegaly.  No rebound or guarding.  Neurologic:  Alert and oriented x3;  grossly normal neurologically. Skin:  Intact without significant lesions or rashes. Cervical Nodes:  No significant cervical adenopathy. Psych:  Alert and cooperative. Normal affect.  LAB RESULTS: Recent Labs    12/03/22 0823  WBC 11.7*  HGB 14.6  HCT 43.8  PLT 249   BMET Recent Labs    12/03/22 0823  NA 135  K 3.3*  CL 99  CO2 27  GLUCOSE 138*  BUN 31*  CREATININE 0.94  CALCIUM 9.3   LFT Recent Labs    12/03/22 0854  PROT 6.6  ALBUMIN 3.8  AST 26  ALT 24  ALKPHOS 67  BILITOT 0.6  BILIDIR 0.1  IBILI 0.5   PT/INR Recent Labs    12/03/22 0854  LABPROT 13.1  INR 1.0    STUDIES: DG Chest 2 View  Result Date: 12/03/2022 CLINICAL DATA:  Chest pain. EXAM: CHEST - 2 VIEW COMPARISON:  April 27, 2014. FINDINGS: The heart size and mediastinal contours are within normal limits. Both lungs are clear. The visualized skeletal structures are unremarkable. IMPRESSION: No active cardiopulmonary disease. Electronically Signed   By: Marijo Conception M.D.   On: 12/03/2022 08:53      Impression / Plan:   ALTA KENION is a 68 y.o. y/o  male presented to the emergency room with upper abdominal pain tarry black stool after starting Mobic a week back with a background of chronic use of Goody powder.  Hemoglobin is at baseline but increase in BUN/creatinine ratio suggestive of an upper GI bleed likely secondary to use of NSAIDs and his history suggestive of a gastric ulcer.  Plan 1.  EGD today  2.  IV PPI 3.  Monitor CBC and transfuse as needed  I have discussed alternative options, risks & benefits,  which include, but are not limited to, bleeding, infection, perforation,respiratory complication & drug reaction.  The patient agrees with this plan & written consent will be obtained.     Thank you for involving me in the care of this patient.      LOS: 0 days   Jonathon Bellows, MD  12/03/2022, 12:26 PM

## 2022-12-03 NOTE — ED Provider Notes (Signed)
[facility] Provider Note    Event Date/Time   First MD Initiated Contact with Patient 12/03/22 [phone]     (approximate)   History   Melena and Chest Pain   HPI  AAGAM GOOTEE is a 67 y.o. male here with epigastric discomfort, melena, fatigue.  The patient states that over the last 2 to 3 days, he has had persistent, worsening, epigastric abdominal discomfort and indigestion type feeling.  Been taking Tums and over-the-counter Pepcid without relief.  He states that over the last 24 hours he is also developed tarry, black stools.  He did not take any Pepto-Bismol.  Of note, he was started on Mobic for musculoskeletal pain last week.  He also has history of hiatal hernia.  He states he is due to get an EGD anyways.  Denies known history of ulcer disease, however.  No other complaints.  Denies any blood thinner use.     Physical Exam   Triage Vital Signs: ED Triage Vitals  Enc Vitals Group     BP 12/03/22 0807 116/89     Pulse Rate 12/03/22 0807 (!) 124     Resp 12/03/22 0807 (!) 22     Temp 12/03/22 0807 99.2 F (37.3 C)     Temp Source 12/03/22 0807 Oral     SpO2 12/03/22 0807 99 %     Weight 12/03/22 0806 200 lb (90.7 kg)     Height 12/03/22 0806 '5\' 10"'$  (1.778 m)     Head Circumference --      Peak Flow --      Pain Score 12/03/22 0806 5     Pain Loc --      Pain Edu? --      Excl. in [place]? --     Most recent vital signs: Vitals:   12/03/22 0807 12/03/22 0815  BP: 116/89 124/82  Pulse: (!) 124 100  Resp: (!) 22 20  Temp: 99.2 F (37.3 C)   SpO2: 99% 99%     General: Awake, no distress.  CV:  Good peripheral perfusion.  Regular rate and rhythm. Resp:  Normal work of breathing.  Lungs clear. Abd:  No distention.  Moderate epigastric tenderness.  No rebound or guarding. Other:  No focal neurological deficits.   ED Results / Procedures / Treatments   Labs (all labs ordered are listed, but only abnormal results are displayed) Labs  Reviewed  BASIC METABOLIC PANEL - Abnormal; Notable for the following components:      Result Value   Potassium 3.3 (*)    Glucose, Bld 138 (*)    BUN 31 (*)    All other components within normal limits  CBC - Abnormal; Notable for the following components:   WBC 11.7 (*)    All other components within normal limits  HEPATIC FUNCTION PANEL  LIPASE, BLOOD  PROTIME-INR  TYPE AND SCREEN  TROPONIN I (HIGH SENSITIVITY)  TROPONIN I (HIGH SENSITIVITY)     EKG Sinus tachycardia, ventricular 100.  PR 149, QRS 69, QTc 429.  No acute ST elevations or depressions.  No acute events of acute ischemia or infarct.  Nonspecific ST depressions noted.   RADIOLOGY Chest x-ray: No free air, otherwise normal   I also independently reviewed and agree with radiologist interpretations.   PROCEDURES:  Critical Care performed: No  .1-3 Lead EKG Interpretation  Performed by: Duffy Bruce, MD Authorized by: Duffy Bruce, MD     Interpretation: abnormal  ECG rate:  90-110   ECG rate assessment: tachycardic     Rhythm: sinus tachycardia     Ectopy: none     Conduction: normal   Comments:     Indication: Chest pain     MEDICATIONS ORDERED IN ED: Medications  sodium chloride 0.9 % bolus 1,000 mL (0 mLs Intravenous Stopped 12/03/22 0930)    And  0.9 %  sodium chloride infusion ( Intravenous New Bag/Given 12/03/22 0930)  pantoprozole (PROTONIX) 80 mg /NS 100 mL infusion (8 mg/hr Intravenous New Bag/Given 12/03/22 0930)  pantoprazole (PROTONIX) 80 mg /NS 100 mL IVPB (0 mg Intravenous Stopped 12/03/22 0922)  ondansetron (ZOFRAN) injection 4 mg (4 mg Intravenous Given 12/03/22 0856)  morphine (PF) 4 MG/ML injection 4 mg (4 mg Intravenous Given 12/03/22 0856)     IMPRESSION / MDM / ASSESSMENT AND PLAN / ED COURSE  I reviewed the triage vital signs and the nursing notes.                              Differential diagnosis includes, but is not limited to, PUD, gastritis, pancreatitis,  cholecystitis, hepatitis, colitis, ACS, PNA, PE  Patient's presentation is most consistent with acute presentation with potential threat to life or bodily function.  The patient is on the cardiac monitor to evaluate for evidence of arrhythmia and/or significant heart rate changes  67 year old male here with melena and abdominal pain after being started on Mobic.  He has history of hiatal hernia.  Patient is hemodynamically stable here other than mild tachycardia.  He expresses lightheadedness and dizziness.  Lab work shows elevated BUN relative to creatinine consistent with likely upper GI bleed.  Hemoglobin stable.  White count 11.7.  His abdomen is soft and nontender without signs of peritonitis to suggest perforation.  No free air on chest x-ray.  Will start on IV PPI, discussed with GI plan to admit to the hospitalist service.  Patient is not on anticoagulation.     FINAL CLINICAL IMPRESSION(S) / ED DIAGNOSES   Final diagnoses:  UGIB (upper gastrointestinal bleed)  Atypical chest pain     Rx / DC Orders   ED Discharge Orders     None        Note:  This document was prepared using Dragon voice recognition software and may include unintentional dictation errors.   Duffy Bruce, MD 12/03/22 [phone]

## 2022-12-04 ENCOUNTER — Encounter: Payer: Self-pay | Admitting: Gastroenterology

## 2022-12-04 DIAGNOSIS — K259 Gastric ulcer, unspecified as acute or chronic, without hemorrhage or perforation: Secondary | ICD-10-CM | POA: Insufficient documentation

## 2022-12-04 DIAGNOSIS — K922 Gastrointestinal hemorrhage, unspecified: Secondary | ICD-10-CM | POA: Diagnosis not present

## 2022-12-04 DIAGNOSIS — K254 Chronic or unspecified gastric ulcer with hemorrhage: Secondary | ICD-10-CM | POA: Diagnosis not present

## 2022-12-04 LAB — COMPREHENSIVE METABOLIC PANEL
ALT: 21 U/L (ref 0–44)
AST: 25 U/L (ref 15–41)
Albumin: 3.2 g/dL — ABNORMAL LOW (ref 3.5–5.0)
Alkaline Phosphatase: 57 U/L (ref 38–126)
Anion gap: 6 (ref 5–15)
BUN: 19 mg/dL (ref 8–23)
CO2: 28 mmol/L (ref 22–32)
Calcium: 8.4 mg/dL — ABNORMAL LOW (ref 8.9–10.3)
Chloride: 103 mmol/L (ref 98–111)
Creatinine, Ser: 0.87 mg/dL (ref 0.61–1.24)
GFR, Estimated: >60 mL/min (ref >60)
Glucose, Bld: 141 mg/dL — ABNORMAL HIGH (ref 70–99)
Potassium: 3.9 mmol/L (ref 3.5–5.1)
Sodium: 137 mmol/L (ref 135–145)
Total Bilirubin: 0.7 mg/dL (ref 0.3–1.2)
Total Protein: 5.6 g/dL — ABNORMAL LOW (ref 6.5–8.1)

## 2022-12-04 LAB — HEMOGLOBIN AND HEMATOCRIT, BLOOD
HCT: 35.9 % — ABNORMAL LOW (ref 39.0–52.0)
HCT: 35.9 % — ABNORMAL LOW (ref 39.0–52.0)
HCT: 38.2 % — ABNORMAL LOW (ref 39.0–52.0)
Hemoglobin: 11.8 g/dL — ABNORMAL LOW (ref 13.0–17.0)
Hemoglobin: 12 g/dL — ABNORMAL LOW (ref 13.0–17.0)
Hemoglobin: 12.6 g/dL — ABNORMAL LOW (ref 13.0–17.0)

## 2022-12-04 MED ORDER — OMEPRAZOLE MAGNESIUM 20 MG PO TBEC: 40.0000 mg | DELAYED_RELEASE_TABLET | Freq: Two times a day (BID) | ORAL | 0 refills | Status: DC

## 2022-12-04 NOTE — Plan of Care (Signed)

## 2022-12-04 NOTE — Discharge Summary (Signed)
Anthony Bender [id number] DOB: [birth year] DOA: 12/03/2022  PCP: Ashley Jacobs, MD  Admit date: 12/03/2022 Discharge date: 12/04/2022  Time spent: 35 minutes  Recommendations for Outpatient Follow-up:  Gi f/u 2 mo Pcp f/u 1 week, cbc then    Discharge Diagnoses:  Principal Problem:   UGIB (upper gastrointestinal bleed) Active Problems:   Asthma without status asthmaticus   Benign prostatic hyperplasia   COPD (chronic obstructive pulmonary disease) ([place])   Barrett's esophagus   Hemorrhoids   Personal history of tobacco use, presenting hazards to health   Hypertension   Hypokalemia   Gastric ulcer   Discharge Condition: stable  Diet recommendation: heart healthy  Filed Weights   12/03/22 0806 12/03/22 1307  Weight: 90.7 kg 90.7 kg    History of present illness:  From admission h and p by dr. Lenord Carbo is a 67 y.o. male with medical history significant of Barrett's esophagus, COPD, tobacco abuse, hypertension Hyperlipidemia, neuropathy presenting with upper GI bleed.  Patient reports worsening epigastric pain over the past 3 to 4 days with new onset recurring black and tarry stools over the past 24 hours or so.  Patient states he was seen for a low back strain last week.  Was prescribed Mobic as well as prednisone for treatment.  Has been compliant with regimen.  Symptoms started within the first 3 to 5 days of treatment.  No fevers or chills.  Patient denies any prior episodes like this in the past.  No chest pain.  No shortness of breath.  Pain predominantly in the epigastric region.  No reported vomiting.  Does not report gross diarrhea but does report several bowel movements that were significantly black and tarry.  1 pack/day smoker.  Denies any active tobacco use.  Does use aspirin at home.  No hemiparesis or confusion.  Last endoscopy was several years ago which showed Barrett's esophagus per patient.    Hospital Course:  Patient presents with several  days melena with associated lightheadedness. Hgb 11s from baseline of around 16. Recently treated for neck pain with prednisone and meloxican, also takes BC powders with some regularity. Upper endoscopy revealed several non-bleeding gastric ulcers, the likely etiology. Hemodynamically stable. Hgb on day of discharge stable in the 11s. Gi advises 3 months of PPI which has been prescribed and f/u with them in 2 months for repeat endoscopy. Advise hold all nsaids and also prednisone. Advise pcp f/u 1 week.  Procedures: Upper endoscopy   Consultations: GI  Discharge Exam: Vitals:   12/04/22 0400 12/04/22 0910  BP: (!) 88/72 107/82  Pulse: 78 94  Resp: 18 18  Temp: 98.3 F (36.8 C) 97.8 F (36.6 C)  SpO2: 99% 99%    General: NAD Cardiovascular: RRR Respiratory: CTAB Abdomen: soft  Discharge Instructions   Discharge Instructions     Diet - low sodium heart healthy   Complete by: As directed    Increase activity slowly   Complete by: As directed       Allergies as of 12/04/2022       Reactions   Yellow Jacket Venom [bee Venom] Anaphylaxis, Shortness Of Breath   Codeine Itching   Metaxalone Swelling   Venlafaxine Other (See Comments)   Loss of appetite, jittery   Zyban [bupropion] Other (See Comments)   hallucinations   Fluoxetine Anxiety   anger        Medication List     STOP taking these medications    esomeprazole 40  MG capsule Commonly known as: NEXIUM   HYDROcodone-acetaminophen 5-325 MG tablet Commonly known as: NORCO/VICODIN   meloxicam 7.5 MG tablet Commonly known as: MOBIC   predniSONE 20 MG tablet Commonly known as: DELTASONE       TAKE these medications    albuterol 108 (90 Base) MCG/ACT inhaler Commonly known as: VENTOLIN HFA Inhale into the lungs every 6 (six) hours as needed for wheezing or shortness of breath.   DULoxetine 60 MG capsule Commonly known as: CYMBALTA Take 60 mg by mouth daily. What changed: Another medication with  the same name was removed. Continue taking this medication, and follow the directions you see here.   fluticasone 50 MCG/ACT nasal spray Commonly known as: FLONASE Place 2 sprays into both nostrils 2 (two) times daily.   Lyrica 150 MG capsule Generic drug: pregabalin Take 150 mg by mouth 3 (three) times daily.   mupirocin ointment 2 % Commonly known as: BACTROBAN Apply 1 Application topically daily.   nortriptyline 10 MG capsule Commonly known as: PAMELOR Take 20 mg by mouth at bedtime.   omeprazole 20 MG tablet Commonly known as: PriLOSEC OTC Take 2 tablets (40 mg total) by mouth in the morning and at bedtime.   rOPINIRole 0.5 MG tablet Commonly known as: REQUIP Take 0.5 mg by mouth 4 (four) times daily as needed.   rosuvastatin 10 MG tablet Commonly known as: CRESTOR Take 10 mg by mouth at bedtime.   SUMAtriptan 25 MG tablet Commonly known as: IMITREX Take 25 mg by mouth every 2 (two) hours as needed for migraine or headache.   telmisartan 80 MG tablet Commonly known as: MICARDIS Take 80 mg by mouth daily.   Trelegy Ellipta 100-62.5-25 MCG/ACT Aepb Generic drug: Fluticasone-Umeclidin-Vilant Inhale 1 puff into the lungs daily.   varenicline 1 MG tablet Commonly known as: CHANTIX Take 1 mg by mouth 2 (two) times daily.   zolpidem 10 MG tablet Commonly known as: AMBIEN Take 0.5 tablets (5 mg total) by mouth at bedtime. PATIENT HAS SUPPLIES       Allergies  Allergen Reactions   Yellow Jacket Venom [Bee Venom] Anaphylaxis and Shortness Of Breath   Codeine Itching   Metaxalone Swelling   Venlafaxine Other (See Comments)    Loss of appetite, jittery   Zyban [Bupropion] Other (See Comments)    hallucinations   Fluoxetine Anxiety    anger    Follow-up Information     Jonathon Bellows, MD Follow up.   Specialty: Gastroenterology Why: call to schedule appointment Contact information: [place] [address] [phone]          Ashley Jacobs, MD. Schedule an appointment as soon as possible for a visit in 1 week(s).   Specialty: Family Medicine Contact information: [place] [address] [phone]                  The results of significant diagnostics from this hospitalization (including imaging, microbiology, ancillary and laboratory) are listed below for reference.    Significant Diagnostic Studies: DG Chest 2 View  Result Date: 12/03/2022 CLINICAL DATA:  Chest pain. EXAM: CHEST - 2 VIEW COMPARISON:  April 27, 2014. FINDINGS: The heart size and mediastinal contours are within normal limits. Both lungs are clear. The visualized skeletal structures are unremarkable. IMPRESSION: No active cardiopulmonary disease. Electronically Signed   By: Marijo Conception M.D.   On: 12/03/2022 08:53    Microbiology: No results found  for this or any previous visit (from the past 240 hour(s)).   Labs: Basic Metabolic Panel: Recent Labs  Lab 12/03/22 0823 12/03/22 1251 12/04/22 0459  NA 135  --  137  K 3.3*  --  3.9  CL 99  --  103  CO2 27  --  28  GLUCOSE 138*  --  141*  BUN 31*  --  19  CREATININE 0.94  --  0.87  CALCIUM 9.3  --  8.4*  MG  --  2.0  --    Liver Function Tests: Recent Labs  Lab 12/03/22 0854 12/04/22 0459  AST 26 25  ALT 24 21  ALKPHOS 67 57  BILITOT 0.6 0.7  PROT 6.6 5.6*  ALBUMIN 3.8 3.2*   Recent Labs  Lab 12/03/22 0854  LIPASE 27   No results for input(s): "AMMONIA" in the last 168 hours. CBC: Recent Labs  Lab 12/03/22 0823 12/03/22 1251 12/03/22 1645 12/03/22 2351 12/04/22 0459  WBC 11.7*  --   --   --   --   HGB 14.6 12.8* 12.4* 12.0* 11.8*  HCT 43.8 38.0* 37.3* 35.9* 35.9*  MCV 98.6  --   --   --   --   PLT 249  --   --   --   --    Cardiac Enzymes: No results for input(s): "CKTOTAL", "CKMB", "CKMBINDEX", "TROPONINI" in the last 168 hours. BNP: BNP (last 3 results) No results for input(s): "BNP" in the last 8760  hours.  ProBNP (last 3 results) No results for input(s): "PROBNP" in the last 8760 hours.  CBG: Recent Labs  Lab 12/03/22 1306  GLUCAP 126*       Signed:  Desma Maxim MD.  [facility] 12/04/2022, 10:09 AM

## 2022-12-05 NOTE — Anesthesia Postprocedure Evaluation (Signed)
Anesthesia Post Note  Patient: Anthony Bender  Procedure(s) Performed: ESOPHAGOGASTRODUODENOSCOPY (EGD) WITH PROPOFOL  Patient location during evaluation: PACU Anesthesia Type: General Level of consciousness: awake and alert Pain management: pain level controlled Vital Signs Assessment: post-procedure vital signs reviewed and stable Respiratory status: spontaneous breathing, nonlabored ventilation, respiratory function stable and patient connected to nasal cannula oxygen Cardiovascular status: blood pressure returned to baseline and stable Postop Assessment: no apparent nausea or vomiting Anesthetic complications: no   No notable events documented.   Last Vitals:  Vitals:   12/04/22 0400 12/04/22 0910  BP: (!) 88/72 107/82  Pulse: 78 94  Resp: 18 18  Temp: 36.8 C 36.6 C  SpO2: 99% 99%    Last Pain:  Vitals:   12/04/22 0910  TempSrc:   PainSc: 0-No pain                 Molli Barrows

## 2023-01-07 ENCOUNTER — Other Ambulatory Visit: Payer: Self-pay

## 2023-01-07 ENCOUNTER — Emergency Department: Payer: No Typology Code available for payment source

## 2023-01-07 ENCOUNTER — Encounter: Payer: Self-pay | Admitting: Emergency Medicine

## 2023-01-07 ENCOUNTER — Emergency Department
Admission: EM | Admit: 2023-01-07 | Discharge: 2023-01-07 | Disposition: A | Discharge status: Home / Self Care | Payer: No Typology Code available for payment source | Attending: Emergency Medicine | Admitting: Emergency Medicine

## 2023-01-07 DIAGNOSIS — R1013 Epigastric pain: Secondary | ICD-10-CM | POA: Diagnosis not present

## 2023-01-07 DIAGNOSIS — J449 Chronic obstructive pulmonary disease, unspecified: Secondary | ICD-10-CM | POA: Diagnosis not present

## 2023-01-07 DIAGNOSIS — E871 Hypo-osmolality and hyponatremia: Secondary | ICD-10-CM | POA: Diagnosis not present

## 2023-01-07 DIAGNOSIS — R1010 Upper abdominal pain, unspecified: Secondary | ICD-10-CM | POA: Diagnosis present

## 2023-01-07 DIAGNOSIS — R112 Nausea with vomiting, unspecified: Secondary | ICD-10-CM | POA: Diagnosis not present

## 2023-01-07 DIAGNOSIS — I10 Essential (primary) hypertension: Secondary | ICD-10-CM | POA: Insufficient documentation

## 2023-01-07 LAB — TROPONIN I (HIGH SENSITIVITY)
Troponin I (High Sensitivity): 12 ng/L (ref <18)
Troponin I (High Sensitivity): 14 ng/L (ref <18)

## 2023-01-07 LAB — HEPATIC FUNCTION PANEL
ALT: 25 U/L (ref 0–44)
AST: 40 U/L (ref 15–41)
Albumin: 4.2 g/dL (ref 3.5–5.0)
Alkaline Phosphatase: 94 U/L (ref 38–126)
Bilirubin, Direct: 0.2 mg/dL (ref 0.0–0.2)
Indirect Bilirubin: 0.8 mg/dL (ref 0.3–0.9)
Total Bilirubin: 1 mg/dL (ref 0.3–1.2)
Total Protein: 7.8 g/dL (ref 6.5–8.1)

## 2023-01-07 LAB — CBC
HCT: 46.3 % (ref 39.0–52.0)
Hemoglobin: 15.7 g/dL (ref 13.0–17.0)
MCH: 31.7 pg (ref 26.0–34.0)
MCHC: 33.9 g/dL (ref 30.0–36.0)
MCV: 93.5 fL (ref 80.0–100.0)
Platelets: 246 10*3/uL (ref 150–400)
RBC: 4.95 MIL/uL (ref 4.22–5.81)
RDW: 14 % (ref 11.5–15.5)
WBC: 9.8 10*3/uL (ref 4.0–10.5)
nRBC: 0 % (ref 0.0–0.2)

## 2023-01-07 LAB — BASIC METABOLIC PANEL
Anion gap: 11 (ref 5–15)
BUN: 17 mg/dL (ref 8–23)
CO2: 21 mmol/L — ABNORMAL LOW (ref 22–32)
Calcium: 9.5 mg/dL (ref 8.9–10.3)
Chloride: 95 mmol/L — ABNORMAL LOW (ref 98–111)
Creatinine, Ser: 0.96 mg/dL (ref 0.61–1.24)
GFR, Estimated: >60 mL/min (ref >60)
Glucose, Bld: 166 mg/dL — ABNORMAL HIGH (ref 70–99)
Potassium: 4.5 mmol/L (ref 3.5–5.1)
Sodium: 127 mmol/L — ABNORMAL LOW (ref 135–145)

## 2023-01-07 LAB — LIPASE, BLOOD: Lipase: 35 U/L (ref 11–51)

## 2023-01-07 MED ORDER — MORPHINE SULFATE (PF) 4 MG/ML IV SOLN
4.0000 mg | Freq: Once | INTRAVENOUS | Status: AC
  Administered 2023-01-07: 4 mg via INTRAVENOUS
  Filled 2023-01-07: qty 1

## 2023-01-07 MED ORDER — IOHEXOL 300 MG/ML  SOLN
100.0000 mL | Freq: Once | INTRAMUSCULAR | Status: AC | PRN
  Administered 2023-01-07: 100 mL via INTRAVENOUS

## 2023-01-07 MED ORDER — ONDANSETRON HCL 4 MG/2ML IJ SOLN
4.0000 mg | Freq: Once | INTRAMUSCULAR | Status: AC
  Administered 2023-01-07: 4 mg via INTRAVENOUS
  Filled 2023-01-07: qty 2

## 2023-01-07 MED ORDER — ACETAMINOPHEN 500 MG PO TABS
1000.0000 mg | ORAL_TABLET | Freq: Once | ORAL | Status: AC
  Administered 2023-01-07: 1000 mg via ORAL
  Filled 2023-01-07: qty 2

## 2023-01-07 MED ORDER — SODIUM CHLORIDE 0.9 % IV BOLUS
1000.0000 mL | Freq: Once | INTRAVENOUS | Status: AC
  Administered 2023-01-07: 1000 mL via INTRAVENOUS

## 2023-01-07 MED ORDER — PANTOPRAZOLE SODIUM 40 MG IV SOLR
40.0000 mg | Freq: Once | INTRAVENOUS | Status: AC
  Administered 2023-01-07: 40 mg via INTRAVENOUS
  Filled 2023-01-07: qty 10

## 2023-01-07 NOTE — ED Triage Notes (Signed)
Says weakness for 2 days.  Says vomitied lt brown emesis this am.  Denies fever.  Recent admit for gi bleed.  No black or bloody stools.

## 2023-01-07 NOTE — ED Notes (Signed)
Pt up to restroom. Pt needing little assistance to ambulate.

## 2023-01-07 NOTE — Discharge Instructions (Signed)
Continue taking your omeprazole as prescribed.  Follow-up with your primary doctor this week and call gastroenterology for an appointment.  Take Zofran for nausea and vomiting as needed.  Your sodium level was slightly low so have your doctor recheck this level later this week.  Thank you for choosing [place] for your health care today!  Please see your primary doctor this week for a follow up appointment.   Sometimes, in the early stages of certain disease courses it is difficult to detect in the emergency department evaluation -- so, it is important that you continue to monitor your symptoms and call your doctor right away or return to the emergency department if you develop any new or worsening symptoms.  Please go to the following website to schedule new (and existing) patient appointments:   [email or web address]  If you do not have a primary doctor try calling the following clinics to establish care:  If you have insurance:  [facility] [phone] [place] [address]   [facility]  [phone] [address] [place] [address]   If you do not have insurance:  [facility]  [phone] [address], [place] [address]   The following is another list of primary care offices in the area who are accepting new patients at this time.  Please reach out to one of them directly and let them know you would like to schedule an appointment to follow up on an Emergency Department visit, and/or to establish a new primary care provider (PCP).  There are likely other primary care clinics in the are who are accepting new patients, but this is an excellent place to start:  [place] physician: Dr Lavon Paganini [address] [place], [place] [address] [phone]  [facility] Lead Physician: Dr Steele Sizer [address], [place],  [place] [address] [phone]  [place] Physician: Dr Park Liter [address] [place], [place] [address] [phone]  [facility] Lead Physician: Dr Dewaine Oats [place], [place], [place] [address] [phone]  [facility] Physician: Dr Halina Maidens [address] [place], [place] [address] [phone]   It was my pleasure to care for you today.   Hoover Brunette Jacelyn Grip, MD

## 2023-01-07 NOTE — ED Provider Notes (Signed)
[facility] Provider Note    Event Date/Time   First MD Initiated Contact with Patient 01/07/23 1314     (approximate)   History   Emesis, Headache, and Weakness   HPI  Anthony Bender is a 67 y.o. male   Past medical history of COPD, hyperlipidemia, hypertension, upper GI bleed with gastric ulcers diagnosed in February 2024 here w chest/upper abdominal pain severe started yesterday no obvious inciting event.  Stopped taking NSAIDs and prednisone as advised, has been compliant with his PPI.  Had an episode of vomiting dark brown material today.  Bowel movements have been normal no lower GI bleeding.  Mild headache as well.  No GU symptoms.  No respiratory infectious symptoms.   External Medical Documents Reviewed: GI notes from February 2024 with gastric ulcers seen on endoscopy      Physical Exam   Triage Vital Signs: ED Triage Vitals  Enc Vitals Group     BP 01/07/23 1156 (!) 127/98     Pulse Rate 01/07/23 1156 97     Resp 01/07/23 1156 16     Temp 01/07/23 1156 98.2 F (36.8 C)     Temp Source 01/07/23 1156 Oral     SpO2 01/07/23 1156 99 %     Weight 01/07/23 1157 194 lb (88 kg)     Height 01/07/23 1157 5\' 11"  (1.803 m)     Head Circumference --      Peak Flow --      Pain Score 01/07/23 1156 4     Pain Loc --      Pain Edu? --      Excl. in [place]? --     Most recent vital signs: Vitals:   01/07/23 1430 01/07/23 1445  BP: 127/88   Pulse: 94 96  Resp: (!) 22 20  Temp:    SpO2: 93% 97%    General: Awake, no distress.  CV:  Good peripheral perfusion.  Resp:  Normal effort.  Abd:  No distention.  Other:  Comfortable with normal vital signs and epigastric tenderness to palpation.  Lungs clear, skin appears warm well-perfused.  His rectal exam empty rectal vault with some yellow mucus no blood or melena   ED Results / Procedures / Treatments   Labs (all labs ordered are listed, but only abnormal results are displayed) Labs  Reviewed  BASIC METABOLIC PANEL - Abnormal; Notable for the following components:      Result Value   Sodium 127 (*)    Chloride 95 (*)    CO2 21 (*)    Glucose, Bld 166 (*)    All other components within normal limits  CBC  HEPATIC FUNCTION PANEL  LIPASE, BLOOD  URINALYSIS, ROUTINE W REFLEX MICROSCOPIC  TROPONIN I (HIGH SENSITIVITY)  TROPONIN I (HIGH SENSITIVITY)     I ordered and reviewed the above labs they are notable for slightly low sodium of 127 and hemoglobin 15 from a prior of 12.  Creatinine normal.  EKG  ED ECG REPORT I, Lucillie Garfinkel, the attending physician, personally viewed and interpreted this ECG.   Date: 01/07/2023  EKG Time: 1203  Rate: 106  Rhythm: sinus tachycardia  Axis: nl  Intervals:none  ST&T Change: No acute ischemic changes    RADIOLOGY I independently reviewed and interpreted CT of the abdomen pelvis and see no obvious obstructive or inflammatory patterns   PROCEDURES:  Critical Care performed: No  Procedures   MEDICATIONS ORDERED IN ED: Medications  ondansetron (ZOFRAN) injection 4 mg (4 mg Intravenous Given 01/07/23 1359)  pantoprazole (PROTONIX) injection 40 mg (40 mg Intravenous Given 01/07/23 1359)  acetaminophen (TYLENOL) tablet 1,000 mg (1,000 mg Oral Given 01/07/23 1359)  morphine (PF) 4 MG/ML injection 4 mg (4 mg Intravenous Given 01/07/23 1359)  iohexol (OMNIPAQUE) 300 MG/ML solution 100 mL (100 mLs Intravenous Contrast Given 01/07/23 1407)  sodium chloride 0.9 % bolus 1,000 mL (1,000 mLs Intravenous New Bag/Given 01/07/23 1458)     IMPRESSION / MDM / ASSESSMENT AND PLAN / ED COURSE  I reviewed the triage vital signs and the nursing notes.                                Patient's presentation is most consistent with acute presentation with potential threat to life or bodily function.  Differential diagnosis includes, but is not limited to, upper GI bleeding, gastric ulcers, perforated viscus, intra-abdominal infection,  cholecystitis or cholelithiasis, pancreatitis, ACS   MDM: This is a patient with known gastric ulcers who has been compliant with his PPI with now worsening abdominal/chest pain and vomiting.  His H&H is actually increased and may reveal a sign of hemoconcentration as well as some low sodium, in the setting of poor p.o. intake I gave some IV crystalloid 1 L as well as IV PPI and antiemetic after which he felt a lot better.  Given his chest involvement will assess for ACS with EKG and serial troponins.  CT of the abdomen pelvis to rule out perf viscus or other intra-abdominal surgical pathologies.   CT neg & patient is much more comfortable after medications.  He got an IV crystalloid bolus.  His tropes were flat and EKG nonischemic.  I considered admission/observation for serial abdominal exams but since the patient is feeling better and had a negative CT scan, I think outpatient management follow-up most appropriate at this time.  He will follow-up with his primary doctor to recheck his labs in the setting of hyponatremia, continue with his PPI, and follow-up with gastroenterology.  He understands to return the emergency department with any new or worsening unexpected symptoms.        FINAL CLINICAL IMPRESSION(S) / ED DIAGNOSES   Final diagnoses:  Epigastric pain  Hyponatremia  Nausea and vomiting, unspecified vomiting type     Rx / DC Orders   ED Discharge Orders     None        Note:  This document was prepared using Dragon voice recognition software and may include unintentional dictation errors.    Lucillie Garfinkel, MD 01/07/23 [phone]

## 2023-01-07 NOTE — ED Notes (Signed)
Pt states "last time I had two bleeding ulcers, now I'm throwing up dark colored vomit."

## 2023-04-16 ENCOUNTER — Other Ambulatory Visit: Payer: Self-pay | Admitting: Surgery

## 2023-04-16 DIAGNOSIS — M1711 Unilateral primary osteoarthritis, right knee: Secondary | ICD-10-CM

## 2023-04-29 ENCOUNTER — Encounter: Payer: Self-pay | Admitting: Surgery

## 2023-05-01 ENCOUNTER — Encounter: Payer: Self-pay | Admitting: Surgery

## 2023-05-05 ENCOUNTER — Ambulatory Visit
Admission: RE | Admit: 2023-05-05 | Discharge: 2023-05-05 | Disposition: A | Discharge status: Home / Self Care | Payer: No Typology Code available for payment source | Source: Ambulatory Visit | Attending: Surgery | Admitting: Surgery

## 2023-05-05 DIAGNOSIS — M1711 Unilateral primary osteoarthritis, right knee: Secondary | ICD-10-CM

## 2023-05-08 ENCOUNTER — Other Ambulatory Visit: Payer: Self-pay | Admitting: Surgery

## 2023-05-08 ENCOUNTER — Encounter: Payer: Self-pay | Admitting: Surgery

## 2023-05-08 ENCOUNTER — Encounter
Admission: RE | Admit: 2023-05-08 | Discharge: 2023-05-08 | Disposition: A | Discharge status: Home / Self Care | Payer: No Typology Code available for payment source | Source: Ambulatory Visit | Attending: Surgery | Admitting: Surgery

## 2023-05-08 ENCOUNTER — Other Ambulatory Visit: Payer: Self-pay

## 2023-05-08 VITALS — BP 133/79 | HR 89 | Temp 98.5°F | Resp 18 | Ht 70.0 in | Wt 183.5 lb

## 2023-05-08 DIAGNOSIS — Z01812 Encounter for preprocedural laboratory examination: Secondary | ICD-10-CM | POA: Insufficient documentation

## 2023-05-08 DIAGNOSIS — M1711 Unilateral primary osteoarthritis, right knee: Secondary | ICD-10-CM | POA: Diagnosis not present

## 2023-05-08 HISTORY — DX: Umbilical hernia without obstruction or gangrene: K42.9

## 2023-05-08 HISTORY — DX: Aortic ectasia, unspecified site: I77.819

## 2023-05-08 HISTORY — DX: Hereditary and idiopathic neuropathy, unspecified: G60.9

## 2023-05-08 HISTORY — DX: Nicotine dependence, unspecified, uncomplicated: F17.200

## 2023-05-08 HISTORY — DX: Major depressive disorder, recurrent, mild: F33.0

## 2023-05-08 LAB — CBC WITH DIFFERENTIAL/PLATELET
Abs Immature Granulocytes: 0.05 10*3/uL (ref 0.00–0.07)
Basophils Absolute: 0.1 10*3/uL (ref 0.0–0.1)
Basophils Relative: 1 %
Eosinophils Absolute: 0.2 10*3/uL (ref 0.0–0.5)
Eosinophils Relative: 3 %
HCT: 43.9 % (ref 39.0–52.0)
Hemoglobin: 14.8 g/dL (ref 13.0–17.0)
Immature Granulocytes: 1 %
Lymphocytes Relative: 21 %
Lymphs Abs: 1.8 10*3/uL (ref 0.7–4.0)
MCH: 31.4 pg (ref 26.0–34.0)
MCHC: 33.7 g/dL (ref 30.0–36.0)
MCV: 93 fL (ref 80.0–100.0)
Monocytes Absolute: 0.6 10*3/uL (ref 0.1–1.0)
Monocytes Relative: 7 %
Neutro Abs: 5.7 10*3/uL (ref 1.7–7.7)
Neutrophils Relative %: 67 %
Platelets: 210 10*3/uL (ref 150–400)
RBC: 4.72 MIL/uL (ref 4.22–5.81)
RDW: 15.3 % (ref 11.5–15.5)
WBC: 8.5 10*3/uL (ref 4.0–10.5)
nRBC: 0 % (ref 0.0–0.2)

## 2023-05-08 LAB — URINALYSIS, ROUTINE W REFLEX MICROSCOPIC
Bilirubin Urine: NEGATIVE
Glucose, UA: NEGATIVE mg/dL
Hgb urine dipstick: NEGATIVE
Ketones, ur: NEGATIVE mg/dL
Leukocytes,Ua: NEGATIVE
Nitrite: NEGATIVE
Protein, ur: NEGATIVE mg/dL
Specific Gravity, Urine: 1.018 (ref 1.005–1.030)
pH: 6 (ref 5.0–8.0)

## 2023-05-08 LAB — COMPREHENSIVE METABOLIC PANEL
ALT: 25 U/L (ref 0–44)
AST: 32 U/L (ref 15–41)
Albumin: 3.6 g/dL (ref 3.5–5.0)
Alkaline Phosphatase: 77 U/L (ref 38–126)
Anion gap: 9 (ref 5–15)
BUN: 10 mg/dL (ref 8–23)
CO2: 25 mmol/L (ref 22–32)
Calcium: 9 mg/dL (ref 8.9–10.3)
Chloride: 105 mmol/L (ref 98–111)
Creatinine, Ser: 0.76 mg/dL (ref 0.61–1.24)
GFR, Estimated: >60 mL/min (ref >60)
Glucose, Bld: 127 mg/dL — ABNORMAL HIGH (ref 70–99)
Potassium: 3.2 mmol/L — ABNORMAL LOW (ref 3.5–5.1)
Sodium: 139 mmol/L (ref 135–145)
Total Bilirubin: 0.6 mg/dL (ref 0.3–1.2)
Total Protein: 6.8 g/dL (ref 6.5–8.1)

## 2023-05-08 LAB — SURGICAL PCR SCREEN
MRSA, PCR: NEGATIVE
Staphylococcus aureus: POSITIVE — AB

## 2023-05-08 LAB — TYPE AND SCREEN
ABO/RH(D): O POS
Antibody Screen: NEGATIVE

## 2023-05-08 NOTE — Patient Instructions (Addendum)
Your procedure is scheduled on: 05/19/2023 Tuesday Report to the Registration Desk on the 1st floor of the [facility]. To find out your arrival time, please call [phone] between 1PM - 3PM on: 8/12/ 24  If your arrival time is 6:00 am, do not arrive before that time as the [facility] entrance doors do not open until 6:00 am.  REMEMBER: Instructions that are not followed completely may result in serious medical risk, up to and including death; or upon the discretion of your surgeon and anesthesiologist your surgery may need to be rescheduled.  Do not eat food after midnight the night before surgery.  No gum chewing or hard candies.  You may however, drink CLEAR liquids up to 2 hours before you are scheduled to arrive for your surgery. Do not drink anything within 2 hours of your scheduled arrival time.  Clear liquids include: - water  - apple juice without pulp - gatorade (not RED colors) - black coffee or tea (Do NOT add milk or creamers to the coffee or tea) Do NOT drink anything that is not on this list.   In addition, your doctor has ordered for you to drink the provided:  Ensure Pre-Surgery Clear Carbohydrate Drink   Drinking this carbohydrate drink up to two hours before surgery helps to reduce insulin resistance and improve patient outcomes. Please complete drinking 2 hours before scheduled arrival time.  One week prior to surgery: Stop Anti-inflammatories (NSAIDS) such as Advil, Aleve, Ibuprofen, Motrin, Naproxen, Naprosyn and Aspirin based products such as Excedrin, Goody's Powder, BC Powder. Stop ANY OVER THE COUNTER supplements until after surgery. You may however, continue to take Tylenol if needed for pain up until the day of surgery.  Follow recommendations from Cardiologist or PCP regarding stopping blood thinners.  TAKE ONLY THESE MEDICATIONS THE MORNING OF SURGERY WITH A SIP OF WATER:   Omeprazole (one at night and one in the am) (take one the night before  and one on the morning of surgery - helps to prevent nausea after surgery.)   Use Trellegy  inhaler on the day of surgery and bring albuterol to the hospital.  No Alcohol for 24 hours before or after surgery.  No Smoking including e-cigarettes for 24 hours before surgery.  No chewable tobacco products for at least 6 hours before surgery.  No nicotine patches on the day of surgery.  Do not use any "recreational" drugs for at least a week (preferably 2 weeks) before your surgery.  Please be advised that the combination of cocaine and anesthesia may have negative outcomes, up to and including death. If you test positive for cocaine, your surgery will be cancelled.  On the morning of surgery brush your teeth with toothpaste and water, you may rinse your mouth with mouthwash if you wish. Do not swallow any toothpaste or mouthwash.  Use CHG Soap  as directed on instruction sheet- provided for you   Do not wear jewelry, make-up, hairpins, clips or nail polish.  Do not wear lotions, powders, or perfumes.   Do not shave body hair from the neck down 48 hours before surgery.  Contact lenses, hearing aids and dentures may not be worn into surgery.  Do not bring valuables to the hospital. [facility] is not responsible for any missing/lost belongings or valuables.   Notify your doctor if there is any change in your medical condition (cold, fever, infection).  Wear comfortable clothing (specific to your surgery type) to the hospital.  After surgery,  you can help prevent lung complications by doing breathing exercises.  Take deep breaths and cough every 1-2 hours. Your doctor may order a device called an Incentive Spirometer to help you take deep breaths.  If you are being admitted to the hospital overnight, leave your suitcase in the car. After surgery it may be brought to your room.  In case of increased patient census, it may be necessary for you, the patient, to continue your  postoperative care in the Same Day Surgery department.  If you are being discharged the day of surgery, you will not be allowed to drive home. You will need a responsible individual to drive you home and stay with you for 24 hours after surgery.    Please call the Pre-admissions Testing Dept. at [phone] if you have any questions about these instructions.  Surgery Visitation Policy:  Patients having surgery or a procedure may have two visitors.  Children under the age of 53 must have an adult with them who is not the patient.  Inpatient Visitation:    Visiting hours are 7 a.m. to 8 p.m. Up to four visitors are allowed at one time in a patient room. The visitors may rotate out with other people during the day.  One visitor age 69 or older may stay with the patient overnight and must be in the room by 8 p.m.       Pre-operative 5 CHG Bath Instructions   You can play a key role in reducing the risk of infection after surgery. Your skin needs to be as free of germs as possible. You can reduce the number of germs on your skin by washing with CHG (chlorhexidine gluconate) soap before surgery. CHG is an antiseptic soap that kills germs and continues to kill germs even after washing.   DO NOT use if you have an allergy to chlorhexidine/CHG or antibacterial soaps. If your skin becomes reddened or irritated, stop using the CHG and notify one of our RNs at [phone].   Please shower with the CHG soap starting 4 days before surgery using the following schedule:     Please keep in mind the following:  DO NOT shave, including legs and underarms, starting the day of your first shower.   You may shave your face at any point before/day of surgery.  Place clean sheets on your bed the day you start using CHG soap. Use a clean washcloth (not used since being washed) for each shower. DO NOT sleep with pets once you start using the CHG.   CHG Shower Instructions:  If you choose to wash  your hair and private area, wash first with your normal shampoo/soap.  After you use shampoo/soap, rinse your hair and body thoroughly to remove shampoo/soap residue.  Turn the water OFF and apply about 3 tablespoons (45 ml) of CHG soap to a CLEAN washcloth.  Apply CHG soap ONLY FROM YOUR NECK DOWN TO YOUR TOES (washing for 3-5 minutes)  DO NOT use CHG soap on face, private areas, open wounds, or sores.  Pay special attention to the area where your surgery is being performed.  If you are having back surgery, having someone wash your back for you may be helpful. Wait 2 minutes after CHG soap is applied, then you may rinse off the CHG soap.  Pat dry with a clean towel  Put on clean clothes/pajamas   If you choose to wear lotion, please use ONLY the CHG-compatible lotions on the back of this  paper.     Additional instructions for the day of surgery: DO NOT APPLY any lotions, deodorants, cologne, or perfumes.   Put on clean/comfortable clothes.  Brush your teeth.  Ask your nurse before applying any prescription medications to the skin.      CHG Compatible Lotions   Aveeno Moisturizing lotion  Cetaphil Moisturizing Cream  Cetaphil Moisturizing Lotion  Clairol Herbal Essence Moisturizing Lotion, Dry Skin  Clairol Herbal Essence Moisturizing Lotion, Extra Dry Skin  Clairol Herbal Essence Moisturizing Lotion, Normal Skin  Curel Age Defying Therapeutic Moisturizing Lotion with Alpha Hydroxy  Curel Extreme Care Body Lotion  Curel Soothing Hands Moisturizing Hand Lotion  Curel Therapeutic Moisturizing Cream, Fragrance-Free  Curel Therapeutic Moisturizing Lotion, Fragrance-Free  Curel Therapeutic Moisturizing Lotion, Original Formula  Eucerin Daily Replenishing Lotion  Eucerin Dry Skin Therapy Plus Alpha Hydroxy Crme  Eucerin Dry Skin Therapy Plus Alpha Hydroxy Lotion  Eucerin Original Crme  Eucerin Original Lotion  Eucerin Plus Crme Eucerin Plus Lotion  Eucerin TriLipid Replenishing  Lotion  Keri Anti-Bacterial Hand Lotion  Keri Deep Conditioning Original Lotion Dry Skin Formula Softly Scented  Keri Deep Conditioning Original Lotion, Fragrance Free Sensitive Skin Formula  Keri Lotion Fast Absorbing Fragrance Free Sensitive Skin Formula  Keri Lotion Fast Absorbing Softly Scented Dry Skin Formula  Keri Original Lotion  Keri Skin Renewal Lotion Keri Silky Smooth Lotion  Keri Silky Smooth Sensitive Skin Lotion  Nivea Body Creamy Conditioning Oil  Nivea Body Extra Enriched Lotion  Nivea Body Original Lotion  Nivea Body Sheer Moisturizing Lotion Nivea Crme  Nivea Skin Firming Lotion  NutraDerm 30 Skin Lotion  NutraDerm Skin Lotion  NutraDerm Therapeutic Skin Cream  NutraDerm Therapeutic Skin Lotion  ProShield Protective Hand Cream  Provon moisturizing lotion    Preoperative Educational Videos for Total Hip, Knee and Shoulder Replacements  To better prepare for surgery, please view our videos that explain the physical activity and discharge planning required to have the best surgical recovery at [facility].  [email or web address]  Questions? Call [phone] or email [email or web address]              How to Use an Incentive Spirometer  An incentive spirometer is a tool that measures how well you are filling your lungs with each breath. Learning to take long, deep breaths using this tool can help you keep your lungs clear and active. This may help to reverse or lessen your chance of developing breathing (pulmonary) problems, especially infection. You may be asked to use a spirometer: After a surgery. If you have a lung problem or a history of smoking. After a long period of time when you have been unable to move or be active. If the spirometer includes an indicator to show the highest number that you have reached, your health care provider or respiratory therapist will help you set a goal. Keep a log of your  progress as told by your health care provider. What are the risks? Breathing too quickly may cause dizziness or cause you to pass out. Take your time so you do not get dizzy or light-headed. If you are in pain, you may need to take pain medicine before doing incentive spirometry. It is harder to take a deep breath if you are having pain. How to use your incentive spirometer  Sit up on the edge of your bed or on a chair. Hold the incentive spirometer so that it is in an upright position. Before you use the spirometer, breathe out normally. Place  the mouthpiece in your mouth. Make sure your lips are closed tightly around it. Breathe in slowly and as deeply as you can through your mouth, causing the piston or the ball to rise toward the top of the chamber. Hold your breath for 3-5 seconds, or for as long as possible. If the spirometer includes a coach indicator, use this to guide you in breathing. Slow down your breathing if the indicator goes above the marked areas. Remove the mouthpiece from your mouth and breathe out normally. The piston or ball will return to the bottom of the chamber. Rest for a few seconds, then repeat the steps 10 or more times. Take your time and take a few normal breaths between deep breaths so that you do not get dizzy or light-headed. Do this every 1-2 hours when you are awake. If the spirometer includes a goal marker to show the highest number you have reached (best effort), use this as a goal to work toward during each repetition. After each set of 10 deep breaths, cough a few times. This will help to make sure that your lungs are clear. If you have an incision on your chest or abdomen from surgery, place a pillow or a rolled-up towel firmly against the incision when you cough. This can help to reduce pain while taking deep breaths and coughing. General tips When you are able to get out of bed: Walk around often. Continue to take deep breaths and cough in order to  clear your lungs. Keep using the incentive spirometer until your health care provider says it is okay to stop using it. If you have been in the hospital, you may be told to keep using the spirometer at home. Contact a health care provider if: You are having difficulty using the spirometer. You have trouble using the spirometer as often as instructed. Your pain medicine is not giving enough relief for you to use the spirometer as told. You have a fever. Get help right away if: You develop shortness of breath. You develop a cough with bloody mucus from the lungs. You have fluid or blood coming from an incision site after you cough. Summary An incentive spirometer is a tool that can help you learn to take long, deep breaths to keep your lungs clear and active. You may be asked to use a spirometer after a surgery, if you have a lung problem or a history of smoking, or if you have been inactive for a long period of time. Use your incentive spirometer as instructed every 1-2 hours while you are awake. If you have an incision on your chest or abdomen, place a pillow or a rolled-up towel firmly against your incision when you cough. This will help to reduce pain. Get help right away if you have shortness of breath, you cough up bloody mucus, or blood comes from your incision when you cough. This information is not intended to replace advice given to you by your health care provider. Make sure you discuss any questions you have with your health care provider. Document Revised: 12/12/2019 Document Reviewed: 12/12/2019 Elsevier Patient Education  2023 [employer].

## 2023-05-18 MED ORDER — CHLORHEXIDINE GLUCONATE 0.12 % MT SOLN
15.0000 mL | Freq: Once | OROMUCOSAL | Status: AC
  Administered 2023-05-19: 15 mL via OROMUCOSAL

## 2023-05-18 MED ORDER — LACTATED RINGERS IV SOLN: INTRAVENOUS | Status: DC

## 2023-05-18 MED ORDER — ORAL CARE MOUTH RINSE: 15.0000 mL | Freq: Once | OROMUCOSAL | Status: AC

## 2023-05-18 MED ORDER — CEFAZOLIN SODIUM-DEXTROSE 2-4 GM/100ML-% IV SOLN
2.0000 g | INTRAVENOUS | Status: AC
  Administered 2023-05-19: 2 g via INTRAVENOUS

## 2023-05-19 ENCOUNTER — Other Ambulatory Visit: Payer: Self-pay

## 2023-05-19 ENCOUNTER — Encounter
Admission: RE | Disposition: A | Discharge status: Home / Self Care | Payer: Self-pay | Source: Home / Self Care | Attending: Surgery

## 2023-05-19 ENCOUNTER — Ambulatory Visit: Payer: No Typology Code available for payment source

## 2023-05-19 ENCOUNTER — Ambulatory Visit
Admission: RE | Admit: 2023-05-19 | Discharge: 2023-05-20 | Disposition: A | Discharge status: Home / Self Care | Payer: No Typology Code available for payment source | Attending: Surgery | Admitting: Surgery

## 2023-05-19 ENCOUNTER — Encounter: Payer: Self-pay | Admitting: Surgery

## 2023-05-19 ENCOUNTER — Ambulatory Visit: Payer: No Typology Code available for payment source | Admitting: Registered Nurse

## 2023-05-19 ENCOUNTER — Ambulatory Visit: Payer: No Typology Code available for payment source | Admitting: Urgent Care

## 2023-05-19 DIAGNOSIS — M1711 Unilateral primary osteoarthritis, right knee: Secondary | ICD-10-CM | POA: Insufficient documentation

## 2023-05-19 DIAGNOSIS — Z01812 Encounter for preprocedural laboratory examination: Secondary | ICD-10-CM

## 2023-05-19 DIAGNOSIS — Z96651 Presence of right artificial knee joint: Secondary | ICD-10-CM

## 2023-05-19 DIAGNOSIS — Z96652 Presence of left artificial knee joint: Secondary | ICD-10-CM | POA: Insufficient documentation

## 2023-05-19 HISTORY — PX: TOTAL KNEE ARTHROPLASTY: SHX125

## 2023-05-19 HISTORY — DX: Unilateral primary osteoarthritis, right knee: M17.11

## 2023-05-19 SURGERY — ARTHROPLASTY, KNEE, TOTAL
Anesthesia: Spinal | Site: Knee | Laterality: Right

## 2023-05-19 MED ORDER — METOCLOPRAMIDE HCL 10 MG PO TABS: 5.0000 mg | ORAL_TABLET | Freq: Three times a day (TID) | ORAL | Status: DC | PRN

## 2023-05-19 MED ORDER — MIDAZOLAM HCL 2 MG/2ML IJ SOLN
INTRAMUSCULAR | Status: AC
  Filled 2023-05-19: qty 2

## 2023-05-19 MED ORDER — ACETAMINOPHEN 325 MG PO TABS: 325.0000 mg | ORAL_TABLET | Freq: Four times a day (QID) | ORAL | Status: DC | PRN

## 2023-05-19 MED ORDER — ZOLPIDEM TARTRATE 5 MG PO TABS
ORAL_TABLET | ORAL | Status: AC
  Filled 2023-05-19: qty 1

## 2023-05-19 MED ORDER — STERILE WATER FOR IRRIGATION IR SOLN
Status: DC | PRN
Start: 2023-05-19 — End: 2023-05-19
  Administered 2023-05-19: 1000 mL

## 2023-05-19 MED ORDER — ROSUVASTATIN CALCIUM 20 MG PO TABS
ORAL_TABLET | ORAL | Status: AC
  Filled 2023-05-19: qty 1

## 2023-05-19 MED ORDER — OMEPRAZOLE MAGNESIUM 20 MG PO TBEC: 40.0000 mg | DELAYED_RELEASE_TABLET | Freq: Two times a day (BID) | ORAL | Status: DC

## 2023-05-19 MED ORDER — BUPIVACAINE HCL (PF) 0.5 % IJ SOLN
INTRAMUSCULAR | Status: AC
  Filled 2023-05-19: qty 10

## 2023-05-19 MED ORDER — ACETAMINOPHEN 500 MG PO TABS
1000.0000 mg | ORAL_TABLET | Freq: Four times a day (QID) | ORAL | Status: DC
  Administered 2023-05-19 – 2023-05-20 (×2): 1000 mg via ORAL

## 2023-05-19 MED ORDER — FENTANYL CITRATE (PF) 100 MCG/2ML IJ SOLN
INTRAMUSCULAR | Status: DC | PRN
  Administered 2023-05-19: 25 ug via INTRAVENOUS

## 2023-05-19 MED ORDER — BISACODYL 10 MG RE SUPP: 10.0000 mg | Freq: Every day | RECTAL | Status: DC | PRN

## 2023-05-19 MED ORDER — PREGABALIN 50 MG PO CAPS
ORAL_CAPSULE | ORAL | Status: AC
  Filled 2023-05-19: qty 3

## 2023-05-19 MED ORDER — FENTANYL CITRATE (PF) 100 MCG/2ML IJ SOLN
INTRAMUSCULAR | Status: AC
  Filled 2023-05-19: qty 2

## 2023-05-19 MED ORDER — CEFAZOLIN SODIUM-DEXTROSE 2-4 GM/100ML-% IV SOLN
2.0000 g | Freq: Four times a day (QID) | INTRAVENOUS | Status: DC
  Administered 2023-05-19: 2 g via INTRAVENOUS

## 2023-05-19 MED ORDER — ALBUTEROL SULFATE (2.5 MG/3ML) 0.083% IN NEBU: 2.5000 mg | INHALATION_SOLUTION | Freq: Four times a day (QID) | RESPIRATORY_TRACT | Status: DC | PRN

## 2023-05-19 MED ORDER — CEFAZOLIN SODIUM-DEXTROSE 2-4 GM/100ML-% IV SOLN
INTRAVENOUS | Status: AC
  Filled 2023-05-19: qty 100

## 2023-05-19 MED ORDER — BUPIVACAINE HCL (PF) 0.5 % IJ SOLN
INTRAMUSCULAR | Status: DC | PRN
  Administered 2023-05-19: 2.5 mL

## 2023-05-19 MED ORDER — SODIUM CHLORIDE 0.9 % IV SOLN: INTRAVENOUS | Status: DC

## 2023-05-19 MED ORDER — SODIUM CHLORIDE 0.9 % BOLUS PEDS
250.0000 mL | Freq: Once | INTRAVENOUS | Status: AC
  Administered 2023-05-19: 250 mL via INTRAVENOUS

## 2023-05-19 MED ORDER — ROSUVASTATIN CALCIUM 20 MG PO TABS
10.0000 mg | ORAL_TABLET | Freq: Every day | ORAL | Status: DC
  Administered 2023-05-19: 10 mg via ORAL

## 2023-05-19 MED ORDER — CEFAZOLIN SODIUM-DEXTROSE 2-4 GM/100ML-% IV SOLN
2.0000 g | Freq: Four times a day (QID) | INTRAVENOUS | Status: AC
  Administered 2023-05-19 – 2023-05-20 (×3): 2 g via INTRAVENOUS

## 2023-05-19 MED ORDER — FENTANYL CITRATE (PF) 100 MCG/2ML IJ SOLN: 25.0000 ug | INTRAMUSCULAR | Status: DC | PRN

## 2023-05-19 MED ORDER — PROPOFOL 10 MG/ML IV BOLUS
INTRAVENOUS | Status: AC
  Filled 2023-05-19: qty 20

## 2023-05-19 MED ORDER — KETOROLAC TROMETHAMINE 15 MG/ML IJ SOLN
INTRAMUSCULAR | Status: AC
  Filled 2023-05-19: qty 1

## 2023-05-19 MED ORDER — ACETAMINOPHEN 10 MG/ML IV SOLN: 1000.0000 mg | Freq: Once | INTRAVENOUS | Status: DC | PRN

## 2023-05-19 MED ORDER — CHLORHEXIDINE GLUCONATE 0.12 % MT SOLN
OROMUCOSAL | Status: AC
  Filled 2023-05-19: qty 15

## 2023-05-19 MED ORDER — OXYCODONE HCL 5 MG PO TABS
5.0000 mg | ORAL_TABLET | ORAL | Status: DC | PRN
  Administered 2023-05-20: 5 mg via ORAL

## 2023-05-19 MED ORDER — MIDAZOLAM HCL 5 MG/5ML IJ SOLN
INTRAMUSCULAR | Status: DC | PRN
  Administered 2023-05-19: 2 mg via INTRAVENOUS

## 2023-05-19 MED ORDER — UMECLIDINIUM BROMIDE 62.5 MCG/ACT IN AEPB
1.0000 | INHALATION_SPRAY | Freq: Every day | RESPIRATORY_TRACT | Status: DC
  Administered 2023-05-20: 1 via RESPIRATORY_TRACT
  Filled 2023-05-19: qty 7

## 2023-05-19 MED ORDER — ONDANSETRON HCL 4 MG/2ML IJ SOLN
INTRAMUSCULAR | Status: AC
  Filled 2023-05-19: qty 2

## 2023-05-19 MED ORDER — DOCUSATE SODIUM 100 MG PO CAPS
ORAL_CAPSULE | ORAL | Status: AC
  Filled 2023-05-19: qty 1

## 2023-05-19 MED ORDER — ONDANSETRON HCL 4 MG/2ML IJ SOLN: 4.0000 mg | Freq: Four times a day (QID) | INTRAMUSCULAR | Status: DC | PRN

## 2023-05-19 MED ORDER — PANTOPRAZOLE SODIUM 40 MG PO TBEC
DELAYED_RELEASE_TABLET | ORAL | Status: AC
  Filled 2023-05-19: qty 2

## 2023-05-19 MED ORDER — TRANEXAMIC ACID-NACL 1000-0.7 MG/100ML-% IV SOLN
INTRAVENOUS | Status: AC
  Filled 2023-05-19: qty 100

## 2023-05-19 MED ORDER — ONDANSETRON HCL 4 MG PO TABS: 4.0000 mg | ORAL_TABLET | Freq: Four times a day (QID) | ORAL | Status: DC | PRN

## 2023-05-19 MED ORDER — FLEET ENEMA RE ENEM: 1.0000 | ENEMA | Freq: Once | RECTAL | Status: DC | PRN

## 2023-05-19 MED ORDER — DEXAMETHASONE SODIUM PHOSPHATE 10 MG/ML IJ SOLN
INTRAMUSCULAR | Status: AC
  Filled 2023-05-19: qty 1

## 2023-05-19 MED ORDER — METOCLOPRAMIDE HCL 5 MG/ML IJ SOLN: 5.0000 mg | Freq: Three times a day (TID) | INTRAMUSCULAR | Status: DC | PRN

## 2023-05-19 MED ORDER — PROPOFOL 500 MG/50ML IV EMUL
INTRAVENOUS | Status: DC | PRN
  Administered 2023-05-19: 100 ug/kg/min via INTRAVENOUS

## 2023-05-19 MED ORDER — BUPIVACAINE HCL (PF) 0.5 % IJ SOLN
INTRAMUSCULAR | Status: AC
  Filled 2023-05-19: qty 30

## 2023-05-19 MED ORDER — FLUTICASONE PROPIONATE 50 MCG/ACT NA SUSP
2.0000 | Freq: Two times a day (BID) | NASAL | Status: DC
  Filled 2023-05-19: qty 16

## 2023-05-19 MED ORDER — PHENYLEPHRINE HCL-NACL 20-0.9 MG/250ML-% IV SOLN
INTRAVENOUS | Status: DC | PRN
  Administered 2023-05-19: 30 ug/min via INTRAVENOUS
  Administered 2023-05-19: 80 ug via INTRAVENOUS

## 2023-05-19 MED ORDER — DULOXETINE HCL 60 MG PO CPEP
60.0000 mg | ORAL_CAPSULE | Freq: Every day | ORAL | Status: DC
  Administered 2023-05-20: 60 mg via ORAL
  Filled 2023-05-19: qty 1

## 2023-05-19 MED ORDER — PROPOFOL 1000 MG/100ML IV EMUL
INTRAVENOUS | Status: AC
  Filled 2023-05-19: qty 100

## 2023-05-19 MED ORDER — ONDANSETRON HCL 4 MG/2ML IJ SOLN
INTRAMUSCULAR | Status: DC | PRN
  Administered 2023-05-19: 4 mg via INTRAVENOUS

## 2023-05-19 MED ORDER — TRIAMCINOLONE ACETONIDE 40 MG/ML IJ SUSP
INTRAMUSCULAR | Status: AC
  Filled 2023-05-19: qty 2

## 2023-05-19 MED ORDER — DEXAMETHASONE SODIUM PHOSPHATE 10 MG/ML IJ SOLN
INTRAMUSCULAR | Status: DC | PRN
Start: 2023-05-19 — End: 2023-05-19
  Administered 2023-05-19: 10 mg via INTRAVENOUS

## 2023-05-19 MED ORDER — ACETAMINOPHEN 10 MG/ML IV SOLN
INTRAVENOUS | Status: DC | PRN
  Administered 2023-05-19: 1000 mg via INTRAVENOUS

## 2023-05-19 MED ORDER — ROPINIROLE HCL 0.25 MG PO TABS: 0.5000 mg | ORAL_TABLET | Freq: Four times a day (QID) | ORAL | Status: DC | PRN

## 2023-05-19 MED ORDER — FLUTICASONE FUROATE-VILANTEROL 100-25 MCG/ACT IN AEPB
1.0000 | INHALATION_SPRAY | Freq: Every day | RESPIRATORY_TRACT | Status: DC
  Administered 2023-05-20: 1 via RESPIRATORY_TRACT
  Filled 2023-05-19: qty 28

## 2023-05-19 MED ORDER — APIXABAN 2.5 MG PO TABS
2.5000 mg | ORAL_TABLET | Freq: Two times a day (BID) | ORAL | Status: DC
  Administered 2023-05-20: 2.5 mg via ORAL

## 2023-05-19 MED ORDER — PANTOPRAZOLE SODIUM 40 MG PO TBEC
80.0000 mg | DELAYED_RELEASE_TABLET | Freq: Every day | ORAL | Status: DC
  Administered 2023-05-19 – 2023-05-20 (×2): 80 mg via ORAL

## 2023-05-19 MED ORDER — ZOLPIDEM TARTRATE 5 MG PO TABS
5.0000 mg | ORAL_TABLET | Freq: Every day | ORAL | Status: DC
  Administered 2023-05-19: 5 mg via ORAL

## 2023-05-19 MED ORDER — OXYCODONE HCL 5 MG/5ML PO SOLN: 5.0000 mg | Freq: Once | ORAL | Status: DC | PRN

## 2023-05-19 MED ORDER — KETOROLAC TROMETHAMINE 15 MG/ML IJ SOLN
15.0000 mg | Freq: Once | INTRAMUSCULAR | Status: AC
  Administered 2023-05-19: 15 mg via INTRAVENOUS

## 2023-05-19 MED ORDER — MAGNESIUM HYDROXIDE 400 MG/5ML PO SUSP: 30.0000 mL | Freq: Every day | ORAL | Status: DC | PRN

## 2023-05-19 MED ORDER — ACETAMINOPHEN 10 MG/ML IV SOLN
INTRAVENOUS | Status: AC
  Filled 2023-05-19: qty 100

## 2023-05-19 MED ORDER — TRANEXAMIC ACID-NACL 1000-0.7 MG/100ML-% IV SOLN
INTRAVENOUS | Status: DC | PRN
  Administered 2023-05-19: 1000 mg via INTRAVENOUS

## 2023-05-19 MED ORDER — PHENYLEPHRINE HCL-NACL 20-0.9 MG/250ML-% IV SOLN
INTRAVENOUS | Status: AC
  Filled 2023-05-19: qty 250

## 2023-05-19 MED ORDER — 0.9 % SODIUM CHLORIDE (POUR BTL) OPTIME
TOPICAL | Status: DC | PRN
  Administered 2023-05-19: 500 mL

## 2023-05-19 MED ORDER — KETOROLAC TROMETHAMINE 30 MG/ML IJ SOLN
INTRAMUSCULAR | Status: AC
  Filled 2023-05-19: qty 1

## 2023-05-19 MED ORDER — ACETAMINOPHEN 500 MG PO TABS
ORAL_TABLET | ORAL | Status: AC
  Filled 2023-05-19: qty 2

## 2023-05-19 MED ORDER — OXYCODONE HCL 5 MG PO TABS: 5.0000 mg | ORAL_TABLET | Freq: Once | ORAL | Status: DC | PRN

## 2023-05-19 MED ORDER — ONDANSETRON HCL 4 MG/2ML IJ SOLN: 4.0000 mg | Freq: Once | INTRAMUSCULAR | Status: DC | PRN

## 2023-05-19 MED ORDER — EPINEPHRINE PF 1 MG/ML IJ SOLN
INTRAMUSCULAR | Status: AC
  Filled 2023-05-19: qty 1

## 2023-05-19 MED ORDER — OXYCODONE HCL 5 MG PO TABS: 5.0000 mg | ORAL_TABLET | ORAL | 0 refills | Status: DC | PRN

## 2023-05-19 MED ORDER — APIXABAN 2.5 MG PO TABS: 2.5000 mg | ORAL_TABLET | Freq: Two times a day (BID) | ORAL | 0 refills | Status: DC

## 2023-05-19 MED ORDER — TRIAMCINOLONE ACETONIDE 40 MG/ML IJ SUSP
INTRAMUSCULAR | Status: DC | PRN
  Administered 2023-05-19: 93 mL

## 2023-05-19 MED ORDER — BUPIVACAINE LIPOSOME 1.3 % IJ SUSP
INTRAMUSCULAR | Status: AC
  Filled 2023-05-19: qty 20

## 2023-05-19 MED ORDER — NORTRIPTYLINE HCL 10 MG PO CAPS
20.0000 mg | ORAL_CAPSULE | Freq: Every day | ORAL | Status: DC
  Administered 2023-05-19: 20 mg via ORAL
  Filled 2023-05-19: qty 2

## 2023-05-19 MED ORDER — SODIUM CHLORIDE 0.9 % IR SOLN
Status: DC | PRN
  Administered 2023-05-19: 3000 mL

## 2023-05-19 MED ORDER — SODIUM CHLORIDE FLUSH 0.9 % IV SOLN
INTRAVENOUS | Status: AC
  Filled 2023-05-19: qty 40

## 2023-05-19 MED ORDER — PREGABALIN 50 MG PO CAPS
150.0000 mg | ORAL_CAPSULE | Freq: Three times a day (TID) | ORAL | Status: DC
  Administered 2023-05-19 – 2023-05-20 (×2): 150 mg via ORAL

## 2023-05-19 MED ORDER — DIPHENHYDRAMINE HCL 12.5 MG/5ML PO ELIX: 12.5000 mg | ORAL_SOLUTION | ORAL | Status: DC | PRN

## 2023-05-19 MED ORDER — DOCUSATE SODIUM 100 MG PO CAPS
100.0000 mg | ORAL_CAPSULE | Freq: Two times a day (BID) | ORAL | Status: DC
  Administered 2023-05-19 – 2023-05-20 (×2): 100 mg via ORAL

## 2023-05-19 MED ORDER — IRBESARTAN 150 MG PO TABS
150.0000 mg | ORAL_TABLET | Freq: Every day | ORAL | Status: DC
  Filled 2023-05-19: qty 1

## 2023-05-19 MED ORDER — KETOROLAC TROMETHAMINE 15 MG/ML IJ SOLN
7.5000 mg | Freq: Four times a day (QID) | INTRAMUSCULAR | Status: DC
  Administered 2023-05-19 – 2023-05-20 (×3): 7.5 mg via INTRAVENOUS

## 2023-05-19 SURGICAL SUPPLY — 68 items
APL PRP STRL LF DISP 70% ISPRP (MISCELLANEOUS) ×1
BLADE SAW 90X13X1.19 OSCILLAT (BLADE) ×2 IMPLANT
BLADE SAW SAG 25X90X1.19 (BLADE) ×2 IMPLANT
BLADE SURG SZ20 CARB STEEL (BLADE) ×2 IMPLANT
BNDG CMPR STD VLCR NS LF 5.8X6 (GAUZE/BANDAGES/DRESSINGS) ×1
BNDG ELASTIC 6X5.8 VLCR NS LF (GAUZE/BANDAGES/DRESSINGS) ×2 IMPLANT
BSPLAT TIB 5D F CMNT STM RT (Knees) ×1 IMPLANT
CEMENT BONE R 1X40 (Cement) ×4 IMPLANT
CEMENT VACUUM MIXING SYSTEM (MISCELLANEOUS) ×2 IMPLANT
CHLORAPREP W/TINT 26 (MISCELLANEOUS) ×2 IMPLANT
COMP FEM CMT PERSONA SZ10 RT (Joint) ×1 IMPLANT
COMPONENT FEM CMT PRNSA SZ10RT (Joint) IMPLANT
COOLER POLAR GLACIER W/PUMP (MISCELLANEOUS) ×2 IMPLANT
COVER MAYO STAND STRL (DRAPES) ×2 IMPLANT
CUFF TOURN SGL QUICK 24 (TOURNIQUET CUFF)
CUFF TOURN SGL QUICK 34 (TOURNIQUET CUFF) ×1
CUFF TRNQT CYL 24X4X16.5-23 (TOURNIQUET CUFF) IMPLANT
CUFF TRNQT CYL 34X4.125X (TOURNIQUET CUFF) IMPLANT
DRAPE 3/4 80X56 (DRAPES) ×2 IMPLANT
DRAPE IMP U-DRAPE 54X76 (DRAPES) ×2 IMPLANT
DRAPE U-SHAPE 47X51 STRL (DRAPES) ×2 IMPLANT
DRSG MEPILEX SACRM 8.7X9.8 (GAUZE/BANDAGES/DRESSINGS) IMPLANT
DRSG OPSITE POSTOP 4X10 (GAUZE/BANDAGES/DRESSINGS) ×2 IMPLANT
DRSG OPSITE POSTOP 4X8 (GAUZE/BANDAGES/DRESSINGS) ×2 IMPLANT
ELECT CAUTERY BLADE 6.4 (BLADE) ×2 IMPLANT
ELECT REM PT RETURN 9FT ADLT (ELECTROSURGICAL) ×1
ELECTRODE REM PT RTRN 9FT ADLT (ELECTROSURGICAL) ×2 IMPLANT
GAUZE XEROFORM 1X8 LF (GAUZE/BANDAGES/DRESSINGS) ×2 IMPLANT
GLOVE BIO SURGEON STRL SZ7.5 (GLOVE) ×8 IMPLANT
GLOVE BIO SURGEON STRL SZ8 (GLOVE) ×8 IMPLANT
GLOVE BIOGEL PI IND STRL 8 (GLOVE) ×2 IMPLANT
GLOVE INDICATOR 8.0 STRL GRN (GLOVE) ×2 IMPLANT
GOWN STRL REUS W/ TWL LRG LVL3 (GOWN DISPOSABLE) ×2 IMPLANT
GOWN STRL REUS W/ TWL XL LVL3 (GOWN DISPOSABLE) ×2 IMPLANT
GOWN STRL REUS W/TWL LRG LVL3 (GOWN DISPOSABLE) ×1
GOWN STRL REUS W/TWL XL LVL3 (GOWN DISPOSABLE) ×1
HANDLE YANKAUER SUCT OPEN TIP (MISCELLANEOUS) ×2 IMPLANT
HOOD PEEL AWAY T7 (MISCELLANEOUS) ×6 IMPLANT
INSERT TIB ARTISURF SZ8-11X12 (Insert) IMPLANT
IV NS IRRIG 3000ML ARTHROMATIC (IV SOLUTION) ×2 IMPLANT
KIT TURNOVER KIT A (KITS) ×2 IMPLANT
MANIFOLD NEPTUNE II (INSTRUMENTS) ×2 IMPLANT
NDL SPNL 20GX3.5 QUINCKE YW (NEEDLE) ×2 IMPLANT
NEEDLE SPNL 20GX3.5 QUINCKE YW (NEEDLE) ×1 IMPLANT
NS IRRIG 1000ML POUR BTL (IV SOLUTION) ×2 IMPLANT
NS IRRIG 500ML POUR BTL (IV SOLUTION) IMPLANT
PACK TOTAL KNEE (MISCELLANEOUS) ×2 IMPLANT
PAD WRAPON POLAR KNEE (MISCELLANEOUS) ×2 IMPLANT
PENCIL SMOKE EVACUATOR (MISCELLANEOUS) ×2 IMPLANT
PIN DRILL HDLS TROCAR 75 4PK (PIN) IMPLANT
PULSAVAC PLUS IRRIG FAN TIP (DISPOSABLE) ×1
SCREW FEMALE HEX FIX 25X2.5 (ORTHOPEDIC DISPOSABLE SUPPLIES) IMPLANT
STAPLER SKIN PROX 35W (STAPLE) ×2 IMPLANT
STEM POLY PAT PLY 38M KNEE (Knees) IMPLANT
STEM TIBIA 5 DEG SZ F R KNEE (Knees) IMPLANT
SUCTION TUBE FRAZIER 10FR DISP (SUCTIONS) ×2 IMPLANT
SUT VIC AB 0 CT1 36 (SUTURE) ×6 IMPLANT
SUT VIC AB 2-0 CT1 27 (SUTURE) ×3
SUT VIC AB 2-0 CT1 TAPERPNT 27 (SUTURE) ×6 IMPLANT
SYR 10ML LL (SYRINGE) ×2 IMPLANT
SYR 20ML LL LF (SYRINGE) ×2 IMPLANT
SYR 30ML LL (SYRINGE) IMPLANT
TIBIA STEM 5 DEG SZ F R KNEE (Knees) ×1 IMPLANT
TIP FAN IRRIG PULSAVAC PLUS (DISPOSABLE) ×2 IMPLANT
TRAP FLUID SMOKE EVACUATOR (MISCELLANEOUS) ×4 IMPLANT
WATER STERILE IRR 1000ML POUR (IV SOLUTION) IMPLANT
WATER STERILE IRR 500ML POUR (IV SOLUTION) ×2 IMPLANT
WRAPON POLAR PAD KNEE (MISCELLANEOUS) ×1

## 2023-05-19 NOTE — Op Note (Signed)
05/19/2023  1:18 PM  Patient:   Anthony Bender  Pre-Op Diagnosis:   Degenerative joint disease, right knee.  Post-Op Diagnosis:   Same  Procedure:   Right TKA using all-cemented Zimmer Persona system with a #10 PCR femur, a(n) F-sized  tibial tray with a 12 mm medial congruent E-poly insert, and a 9.5 x 38 mm all-poly 3-pegged domed patella.  Surgeon:   Maryagnes Amos, MD  Assistant:   Horris Latino, PA-C; Rosezena Sensor, PA-S  Anesthesia:   GET  Findings:   As above  Complications:   None  EBL:   15 cc  Fluids:   400 cc crystalloid  UOP:   None  TT:   100 minutes at 300 mmHg  Drains:   None  Closure:   Staples  Implants:   As above  Brief Clinical Note:   The patient is a 67 year old male with a long history of progressively worsening right knee pain. The patient's symptoms have progressed despite medications, activity modification, injections, etc. The patient's history and examination were consistent with advanced degenerative joint disease of the right knee confirmed by plain radiographs. The patient presents at this time for a right total knee arthroplasty.  Procedure:   The patient was brought into the operating room. After adequate spinal anesthesia was obtained, the patient was repositioned in the supine position on the operating room table. The right lower extremity was prepped with ChloraPrep solution and draped sterilely. Preoperative antibiotics were administered. A timeout was performed to verify the appropriate surgical site before the limb was exsanguinated with an Esmarch and the tourniquet inflated to 300 mmHg.   A standard anterior approach to the knee was made through an approximately 6-7 inch incision. The incision was carried down through the subcutaneous tissues to expose superficial retinaculum. This was split the length of the incision and the medial flap elevated sufficiently to expose the medial retinaculum. The medial retinaculum was incised,  leaving a 3-4 mm cuff of tissue on the patella. This was extended distally along the medial border of the patellar tendon and proximally through the medial third of the quadriceps tendon. A subtotal fat pad excision was performed before the soft tissues were elevated off the anteromedial and anterolateral aspects of the proximal tibia to the level of the collateral ligaments. The anterior portions of the medial and lateral menisci were removed, as was the anterior cruciate ligament. With the knee flexed to 90, the external tibial guide was positioned and the appropriate proximal tibial cut made. This piece was taken to the back table where it was measured and found to be optimally replicated by a(n) F-sized component.  Attention was directed to the distal femur. The intramedullary canal was accessed through a 3/8" drill hole. The intramedullary guide was inserted and positioned in order to obtain a neutral flexion gap. The distal cutting block was placed at 5 of valgus alignment. Using the +0 slot, the distal cut was made. The distal femur was measured and found to be optimally replicated by the #10 component. The #10 4-in-1 cutting block was positioned and first the posterior, then the posterior chamfer, the anterior chamfer, and finally the anterior cuts were made after verifying that the anterior cortex would not be notched.   At this point, the posterior portions medial and lateral menisci were removed. A trial reduction was performed using the appropriate femoral and tibial components with first the 10 mm, then the 11 mm, and finally the 12 mm insert. The  12 mm insert demonstrated excellent stability to varus and valgus stressing both in flexion and extension while permitting full extension. Patellar tracking was assessed and found to be excellent. Therefore, the tibial trial position was marked on the proximal tibia. The patella thickness was measured and found to be 24 mm. Therefore, the appropriate cut  was made. The patellar surface was measured and found to be optimally replicated by the 38 mm component. The three peg holes were drilled in place before the trial button was inserted. Patella tracking was assessed and found to be excellent, passing the "no thumb test". The lug holes were drilled into the distal femur before the trial component was removed.  The tibial tray was repositioned before the keel was created using the appropriate tower, reamer, and punch.  The bony surfaces were prepared for cementing by irrigating them thoroughly with sterile saline solution via the jet lavage system. A bone plug was fashioned from some of the bone that had been removed previously and used to plug the distal femoral canal. In addition, a "cocktail" of 20 cc of Exparel, 30 cc of 0.5% Sensorcaine, 2 cc of Kenalog 40 (80 mg), and 30 mg of Toradol diluted out to 90 cc with normal saline was injected into the postero-medial and postero-lateral aspects of the knee, the medial and lateral gutter regions, and the peri-incisional tissues to help with postoperative analgesia. Meanwhile, the cement was being mixed on the back table.   When the cement was ready, the tibial tray was cemented in first. The excess cement was removed using Personal assistant. Next, the femoral component was impacted into place. Again, the excess cement was removed using Personal assistant. The 12 mm trial insert was positioned and the knee brought into extension while the cement hardened. Finally, the patella was cemented into place and secured using the patellar clamp. Again, the excess cement was removed using Personal assistant. Once the cement had hardened, the knee was placed through a range of motion with the findings as described above. Therefore, the trial insert was removed and, after verifying that no cement had been retained posteriorly, the permanent 12 mm medial congruent E-polyethylene insert was snapped into place with care taken to ensure  appropriate locking of the insert. Again the knee was placed through a range of motion with the findings as described above.  The wound was copiously irrigated with sterile saline solution using the jet lavage system before the quadriceps tendon and retinacular layer were reapproximated using #0 Vicryl interrupted sutures. The superficial retinacular layer also was closed using a running #0 Vicryl suture. The subcutaneous tissues were closed in several layers using 2-0 Vicryl interrupted sutures. The skin was closed using staples. A sterile honeycomb dressing was applied to the skin before the leg was wrapped with an Ace wrap to accommodate the Polar Care device. The patient was then awakened and returned to the recovery room in satisfactory condition after tolerating the procedure well.

## 2023-05-19 NOTE — Transfer of Care (Signed)
Immediate Anesthesia Transfer of Care Note  Patient: Anthony Bender  Procedure(s) Performed: TOTAL KNEE ARTHROPLASTY (Right: Knee)  Patient Location: PACU  Anesthesia Type:General and Spinal  Level of Consciousness: drowsy  Airway & Oxygen Therapy: Patient Spontanous Breathing and Patient connected to face mask oxygen  Post-op Assessment: Report given to RN and Post -op Vital signs reviewed and stable  Post vital signs: Reviewed and stable  Last Vitals:  Vitals Value Taken Time  BP 105/79 05/19/23 1326  Temp 97.5   Pulse 77 05/19/23 1328  Resp 20 05/19/23 1328  SpO2 100 % 05/19/23 1328  Vitals shown include unfiled device data.  Last Pain:  Vitals:   05/19/23 0912  TempSrc: Oral  PainSc: 0-No pain         Complications: No notable events documented.

## 2023-05-19 NOTE — H&P (Signed)
History of Present Illness: Anthony Bender is a 67 y.o. who presents today for a history and physical. Patient is to undergo a right total knee arthroplasty on 05/19/2023. Since his last visit here to clinic there is been no improvement in his condition. The patient expresses his desire to proceed with surgery.  The symptoms began many years ago. He has undergone several arthroscopic procedures on this knee for "torn cartilage" . He has also undergone a left partial knee replacement in 2016 by Dr. Gavin Potters from which he has done quite well. Over the past few years, his right knee symptoms have progressed despite several steroid injections administered by Dedra Skeens, PA-C. Therefore, the patient has been referred to me for further evaluation and treatment. He reports 6/10 pain. The pain is located along the anterior and medial aspect of the knee. The pain is described as aching, dull, stabbing, and throbbing. The symptoms are aggravated with normal daily activities, using stairs, at higher levels of activity, walking, standing, and standing pivot. He also describes occasional episodes of giving way. He has associated swelling and deformity. He has tried over-the-counter medications, steroid injections, and a home exercise program with limited benefit.   Past Medical History: Asthma without status asthmaticus (HHS-HCC)  Atypical chest pain 04/27/2018  Barrett's esophagus 01/12/2015  BPH (benign prostatic hypertrophy)  Chickenpox  Chronic insomnia 08/24/2015  COPD (chronic obstructive pulmonary disease) (CMS/HHS-HCC)  Esophageal yeast infection (CMS/HHS-HCC) 01/12/2015  GERD (gastroesophageal reflux disease)  Hemorrhoids  Hyperlipidemia  Hypertension  Mixed hyperlipidemia 01/19/2017  Mood disorder (CMS-HCC) 09/20/2018  Neuropathy, peripheral, idiopathic 03/21/2014  Osteoarthritis  Peripheral neuropathy  Vitamin D deficiency   Past Surgical History: COLONOSCOPY 01/08/2006  EGD 11/20/2011,  09/14/2008, 01/08/2006, 08/11/2000  EGD with Barrx 01/12/2015 (Barrett's Esophagus/Repeat 2 months with Barrx procedure/PYO)  EGD with Barrx 03/15/2015 (No specimen collected/Repeat 13months/PYO)  Left Partial Knee Replacement 06/18/2015 (medial)  EGD with Barrx 08/23/2015 (Barrett's treated with BARRX/Repeat 2-3 months with possible Barrx/PYO)  EGD with Barrx 01/24/2016 (Barrett's treated with BARRX/Follow up in office in 2months to decide where to go for repeat Barrx/PYO)  ESOPHAGOGASTRODOUDENOSCOPY W/ABLATION TUMOR/POLYP/LESION N/A 11/06/2016  Procedure: EGD; Surgeon: Pleas Patricia, MD; Location: [facility]; Service: Gastroenterology; Laterality: N/A;  COLONOSCOPY W/REMOVAL LESIONS BY SNARE N/A 11/06/2016  Procedure: COLORECTAL CANCER SCREENING; Surgeon: Pleas Patricia, MD; Location: [facility]; Service: Gastroenterology; Laterality: N/A;  ESOPHAGOGASTRODOUDENOSCOPY W/BIOPSY N/A 02/25/2017  Procedure: EGD; Surgeon: Pleas Patricia, MD; Location: [facility]; Service: Gastroenterology; Laterality: N/A;  ESOPHAGOGASTRODOUDENOSCOPY W/ABLATION TUMOR/POLYP/LESION N/A 02/25/2017  Procedure: ESOPHAGOGASTRODUODENOSCOPY, FLEXIBLE, TRANSORAL; WITH ABLATION OF TUMOR(S), POLYP(S), OR OTHER LESION(S) (INCLUDES PRE- AND POST-DILATION AND GUIDE WIRE PASSAGE, WHEN PERFORMED; Surgeon: Pleas Patricia, MD; Location: [facility]; Service: Gastroenterology; Laterality: N/A;  ESOPHAGOGASTRODOUDENOSCOPY W/BIOPSY N/A 09/03/2017  Procedure: EGD; Surgeon: Pleas Patricia, MD; Location: [facility]; Service: Gastroenterology; Laterality: N/A;  APPENDECTOMY  BACK SURGERY (fatty tumor removed from back)  HERNIA REPAIR Right inguinal  KNEE ARTHROSCOPY Bilateral  Nerve release Right right arm  Reattachment of finger   Past Family History: ALS Father  Prostate cancer Father  COPD Father  Diabetes type II Mother  High blood pressure (Hypertension)  Mother  Diabetes Mother  Stroke Mother  Osteoporosis (Thinning of bones) Mother  Tuberculosis Mother  Brain cancer Sister  Anesthesia problems Neg Hx   Medications: albuterol (PROAIR HFA) 90 mcg/actuation inhaler Inhale 2 inhalations into the lungs every 6 (six) hours as needed for Wheezing 1 each 6  albuterol (PROVENTIL) 2.5 mg /  3 mL (0.083 %) nebulizer solution Take 3 mLs (2.5 mg total) by nebulization every 6 (six) hours as needed for Wheezing or Shortness of Breath 15 mL 1  DULoxetine (CYMBALTA) 60 MG DR capsule Take 1 capsule (60 mg total) by mouth once daily for 360 days 90 capsule 3  fluticasone propionate (FLONASE) 50 mcg/actuation nasal spray SHAKE LIQUID AND USE 2 SPRAYS IN EACH NOSTRIL DAILY 16 g 7  fluticasone-umeclidinium-vilanterol (TRELEGY ELLIPTA) 100-62.5-25 mcg inhaler INHALE 1 PUFF INTO THE LUNGS DAILY 60 each 5  nortriptyline (PAMELOR) 10 MG capsule Take 2 capsules (20 mg total) by mouth at bedtime for 360 days 180 capsule 3  omega-3 acid ethyl esters (LOVAZA) 1 gram capsule Take 1 capsule (1 g total) by mouth 2 (two) times daily 60 capsule 5  omeprazole (PRILOSEC) 40 MG DR capsule Take 1 capsule (40 mg total) by mouth 2 (two) times daily before meals 180 capsule 1  pregabalin (LYRICA) 150 MG capsule Take 1 capsule (150 mg total) by mouth 3 (three) times daily 90 capsule 3  rOPINIRole (REQUIP) 0.5 MG tablet Take 1 tablet (0.5 mg total) by mouth 3 (three) times daily for 360 days You may take an addition tablet (0.5 mg) at night as needed. 270 tablet 3  rosuvastatin (CRESTOR) 10 MG tablet Take 1 tablet (10 mg total) by mouth once daily 90 tablet 3  telmisartan (MICARDIS) 40 MG tablet Take 1 tablet (40 mg total) by mouth once daily 90 tablet 2  zolpidem (AMBIEN) 10 mg tablet TAKE 1 TABLET BY MOUTH AT BEDTIME AS NEEDED FOR SLEEP. 30 tablet 2   Allergies: Venom-Honey Bee (Anaphylaxis and Shortness Of Breath)  Codeine (Itching)  Bupropion (Hallucination)  Effexor  [Venlafaxine] Other (Loss of appetite, jittery)  Prozac [Fluoxetine] Other (Anger)  Skelaxin [Metaxalone] (Swelling)  Zyban [Bupropion Hcl] (Hallucination)   Review of Systems: A comprehensive 14 point ROS was performed, reviewed, and the pertinent orthopaedic findings are documented in the HPI.  Physical Exam: BP 120/70 (BP Location: Left upper arm, Patient Position: Sitting, BP Cuff Size: Adult)  Ht 177.8 cm (5\' 10" )  Wt 84.6 kg (186 lb 9.6 oz)  BMI 26.77 kg/m   General: Well-developed well-nourished male seen in no acute distress.   HEENT: Atraumatic,normocephalic. Pupils are equal and reactive to light. Oropharynx is clear with moist mucosa  Lungs: Clear to auscultation bilaterally   Cardiovascular: Regular rate and rhythm. Normal S1, S2. No murmurs. No appreciable gallops or rubs. Peripheral pulses are palpable.  Abdomen: Soft, non-tender, nondistended. Bowel sounds present  Right knee exam: GAIT: Moderate antalgic gait, but uses no assistive devices. ALIGNMENT: moderate varus SKIN: Well-healed arthroscopic portal sites, otherwise unremarkable. SWELLING: mild EFFUSION: small WARMTH: no warmth TENDERNESS: mild over the medial joint line ROM: 12 to 120 degrees with mild pain in maximal flexion McMURRAY'S: equivocal PATELLOFEMORAL: normal tracking with no peri-patellar tenderness and negative apprehension sign and moderate lateral peripatellar pain CREPITUS: no LACHMAN'S: negative PIVOT SHIFT: negative ANTERIOR DRAWER: negative POSTERIOR DRAWER: negative VARUS/VALGUS: positive pseudolaxity to varus stressing  Neurological: The patient is alert and oriented Sensation to light touch appears to be intact and within normal limits Gross motor strength appeared to be equal to 5/5  Vascular : Peripheral pulses felt to be palpable. Capillary refill appears to be intact and within normal limits  X-ray: X-rays of the right knee taken in [facility]ics  demonstrate severe degenerative changes, primarily involving the medial compartment with 100% medial joint space narrowing. Overall alignment is significant  varus. No fractures, lytic lesions, or abnormal calcifications are noted.   Impression: 1. Degenerative arthrosis right knee.  Plan:  The treatment options were discussed with the patient. In addition, patient educational materials were provided regarding the diagnosis and treatment options. The patient is quite frustrated by his symptoms and functional limitations, and is ready to consider more aggressive treatment options. Therefore, I have recommended that we proceed with a surgical procedure, specifically a right total knee arthroplasty. The procedure was discussed with the patient, as were the potential risks (including bleeding, infection, nerve and/or blood vessel injury, persistent or recurrent pain, loosening and/or failure of the components, dislocation, need for further surgery, blood clots, strokes, heart attacks and/or arhythmias, pneumonia, etc.) and benefits. The patient states his understanding and wishes to proceed. All of the patient's questions and concerns were answered. He can call any time with further concerns. He will return to work without restrictions. He will follow up post-surgery, routine.     H&P reviewed and patient re-examined. No changes.

## 2023-05-19 NOTE — Progress Notes (Signed)
PT Cancellation Note  Patient Details Name: Anthony Bender MRN: [id number] DOB: [birth year]   Cancelled Treatment:    Reason Eval/Treat Not Completed: Other (comment): Upon entering room patient found to be very lethargic with nursing in room and reporting pt not appropriate to participate with PT services this date secondary to lethargy and still awaiting return of LE strength.  Will attempt to see pt at a future date/time as medically appropriate.    Ovidio Hanger PT, DPT 05/19/23, 4:54 PM

## 2023-05-19 NOTE — Anesthesia Procedure Notes (Signed)
Spinal  Patient location during procedure: OR Start time: 05/19/2023 10:52 AM End time: 05/19/2023 10:57 AM Reason for block: surgical anesthesia Staffing Performed: resident/CRNA  Anesthesiologist: Corinda Gubler, MD Resident/CRNA: Calise Dunckel, [place], CRNA Performed by: Adaline Trejos, [place], CRNA Authorized by: Corinda Gubler, MD   Preanesthetic Checklist Completed: patient identified, IV checked, site marked, risks and benefits discussed, surgical consent, monitors and equipment checked, pre-op evaluation and timeout performed Spinal Block Patient position: sitting Prep: Betadine Patient monitoring: heart rate, continuous pulse ox, blood pressure and cardiac monitor Approach: midline Location: L3-4 Injection technique: single-shot Needle Needle type: Whitacre and Introducer  Needle gauge: 24 G Needle length: 9 cm Assessment Events: CSF return Additional Notes Negative paresthesia. Negative blood return. Positive free-flowing CSF. Expiration date of kit checked and confirmed. Patient tolerated procedure well, without complications.

## 2023-05-19 NOTE — Plan of Care (Signed)
Reviewed all with patient and patient's wife Anthony Bender

## 2023-05-19 NOTE — Plan of Care (Signed)
  Problem: Pain Managment: Goal: General experience of comfort will improve Outcome: Progressing   Problem: Safety: Goal: Ability to remain free from injury will improve Outcome: Progressing   

## 2023-05-19 NOTE — Progress Notes (Signed)
Patient is not able to walk the distance required to go the bathroom, or he/she is unable to safely negotiate stairs required to access the bathroom.  A 3in1 BSC will alleviate this problem  

## 2023-05-19 NOTE — Progress Notes (Signed)
Patient awake/alert, sleepy but easily arouses. Wife at bedside.  PT evaluated patient, unable to participate and will need to stay overnight for further care. Patient and family verbalize understanding. Pt admitted to overnight stay room peri op 15.

## 2023-05-19 NOTE — Anesthesia Preprocedure Evaluation (Signed)
Anesthesia Evaluation  Patient identified by MRN, date of birth, ID band Patient awake    Reviewed: Allergy & Precautions, NPO status , Patient's Chart, lab work & pertinent test results  History of Anesthesia Complications Negative for: history of anesthetic complications  Airway Mallampati: II  TM Distance: >3 FB Neck ROM: Full    Dental  (+) Edentulous Upper, Edentulous Lower   Pulmonary neg sleep apnea, COPD,  COPD inhaler, Current Smoker and Patient abstained from smoking.   Pulmonary exam normal breath sounds clear to auscultation       Cardiovascular Exercise Tolerance: Good METShypertension, Pt. on medications (-) CAD and (-) Past MI (-) dysrhythmias  Rhythm:Regular Rate:Normal - Systolic murmurs    Neuro/Psych  Headaches PSYCHIATRIC DISORDERS Anxiety Depression     Neuromuscular disease    GI/Hepatic hiatal hernia, PUD,GERD  Medicated and Controlled,,(+)     (-) substance abuse    Endo/Other  neg diabetes    Renal/GU negative Renal ROS     Musculoskeletal  (+) Arthritis ,    Abdominal   Peds  Hematology   Anesthesia Other Findings Past Medical History: No date: Anxiety No date: Aortic ectasia (HCC) No date: Asthma No date: Barrett's esophagus No date: BPH (benign prostatic hyperplasia) No date: COPD (chronic obstructive pulmonary disease) (HCC) No date: DDD (degenerative disc disease), lumbar No date: GERD (gastroesophageal reflux disease) No date: Headache No date: Hemorrhoids No date: Hyperlipidemia No date: Hyperlipidemia No date: Hypertension No date: Insomnia No date: Mild episode of recurrent major depressive disorder (HCC) No date: Neuropathy No date: Neuropathy, peripheral, idiopathic No date: Osteoarthritis No date: Primary osteoarthritis of right knee No date: Smoker No date: Tobacco use No date: Umbilical hernia No date: Vitamin D deficiency  Reproductive/Obstetrics                              Anesthesia Physical Anesthesia Plan  ASA: 3  Anesthesia Plan: Spinal   Post-op Pain Management: Ofirmev IV (intra-op)*   Induction: Intravenous  PONV Risk Score and Plan: 0 and Ondansetron, Dexamethasone, Propofol infusion, TIVA and Midazolam  Airway Management Planned: Natural Airway  Additional Equipment: None  Intra-op Plan:   Post-operative Plan:   Informed Consent: I have reviewed the patients History and Physical, chart, labs and discussed the procedure including the risks, benefits and alternatives for the proposed anesthesia with the patient or authorized representative who has indicated his/her understanding and acceptance.       Plan Discussed with: CRNA and Surgeon  Anesthesia Plan Comments: (Discussed R/B/A of neuraxial anesthesia technique with patient: - rare risks of spinal/epidural hematoma, nerve damage, infection - Risk of PDPH - Risk of nausea and vomiting - Risk of conversion to general anesthesia and its associated risks, including sore throat, damage to lips/eyes/teeth/oropharynx, and rare risks such as cardiac and respiratory events. - Risk of allergic reactions  Patient counseled on benefits of smoking cessation, and increased perioperative risks associated with continued smoking.   Discussed the role of CRNA in patient's perioperative care.  Patient voiced understanding.)       Anesthesia Quick Evaluation

## 2023-05-19 NOTE — Discharge Instructions (Addendum)
Orthopedic discharge instructions: May shower with intact OpSite dressing. Apply ice frequently to knee or use Polar Care. Start Eliquis 1 tablet (2.5 mg) twice daily on Wednesday, 05/20/2023, for 2 weeks, then take aspirin 325 mg twice daily for 4 weeks. Take pain medication as prescribed when needed.  May supplement with ES Tylenol if necessary. May weight-bear as tolerated on right leg - use walker for balance and support. Follow-up in 10-14 days or as scheduled.   POLAR CARE INFORMATION  MassAdvertisement.it  How to use [facility] Polar Care [facility] Therapy System?  YouTube   ShippingScam.co.uk  OPERATING INSTRUCTIONS  Start the product With dry hands, connect the transformer to the electrical connection located on the top of the cooler. Next, plug the transformer into an appropriate electrical outlet. The unit will automatically start running at this point.  To stop the pump, disconnect electrical power.  Unplug to stop the product when not in use. Unplugging the Polar Care unit turns it off. Always unplug immediately after use. Never leave it plugged in while unattended. Remove pad.    FIRST ADD WATER TO FILL LINE, THEN ICE---Replace ice when existing ice is almost melted  1 Discuss Treatment with your Licensed Health Care Practitioner and Use Only as Prescribed 2 Apply Insulation Barrier & Cold Therapy Pad 3 Check for Moisture 4 Inspect Skin Regularly  Tips and Trouble Shooting Usage Tips 1. Use cubed or chunked ice for optimal performance. 2. It is recommended to drain the Pad between uses. To drain the pad, hold the Pad upright with the hose pointed toward the ground. Depress the black plunger and allow water to drain out. 3. You may disconnect the Pad from the unit without removing the pad from the affected area by depressing the silver tabs on the hose coupling and gently pulling the hoses apart. The Pad and unit will seal itself and will not leak. Note:  Some dripping during release is normal. 4. DO NOT RUN PUMP WITHOUT WATER! The pump in this unit is designed to run with water. Running the unit without water will cause permanent damage to the pump. 5. Unplug unit before removing lid.  TROUBLESHOOTING GUIDE Pump not running, Water not flowing to the pad, Pad is not getting cold 1. Make sure the transformer is plugged into the wall outlet. 2. Confirm that the ice and water are filled to the indicated levels. 3. Make sure there are no kinks in the pad. 4. Gently pull on the blue tube to make sure the tube/pad junction is straight. 5. Remove the pad from the treatment site and ll it while the pad is lying at; then reapply. 6. Confirm that the pad couplings are securely attached to the unit. Listen for the double clicks (Figure 1) to confirm the pad couplings are securely attached.  Leaks    Note: Some condensation on the lines, controller, and pads is unavoidable, especially in warmer climates. 1. If using a Breg Polar Care Cold Therapy unit with a detachable Cold Therapy Pad, and a leak exists (other than condensation on the lines) disconnect the pad couplings. Make sure the silver tabs on the couplings are depressed before reconnecting the pad to the pump hose; then confirm both sides of the coupling are properly clicked in. 2. If the coupling continues to leak or a leak is detected in the pad itself, stop using it and call B[employer] Customer Care at [phone].  Cleaning After use, empty and dry the unit with a soft  cloth. Warm water and mild detergent may be used occasionally to clean the pump and tubes.  WARNING: The Polar Care Cube can be cold enough to cause serious injury, including full skin necrosis. Follow these Operating Instructions, and carefully read the Product Insert (see pouch on side of unit) and the Cold Therapy Pad Fitting Instructions (provided with each Cold Therapy Pad) prior to use.       AMBULATORY SURGERY  DISCHARGE  INSTRUCTIONS   The drugs that you were given will stay in your system until tomorrow so for the next 24 hours you should not:  Drive an automobile Make any legal decisions Drink any alcoholic beverage   You may resume regular meals tomorrow.  Today it is better to start with liquids and gradually work up to solid foods.  You may eat anything you prefer, but it is better to start with liquids, then soup and crackers, and gradually work up to solid foods.   Please notify your doctor immediately if you have any unusual bleeding, trouble breathing, redness and pain at the surgery site, drainage, fever, or pain not relieved by medication.    Additional Instructions:  leave green armband on for 4 days    Information on my medicine - ELIQUIS (apixaban)  This medication education was reviewed with me or my healthcare representative as part of my discharge preparation.    Why was Eliquis prescribed for you? Eliquis was prescribed for you to reduce the risk of blood clots forming after orthopedic surgery.    What do You need to know about Eliquis? Take your Eliquis TWICE DAILY - one tablet in the morning and one tablet in the evening with or without food.  It would be best to take the dose about the same time each day.  If you have difficulty swallowing the tablet whole please discuss with your pharmacist how to take the medication safely.  Take Eliquis exactly as prescribed by your doctor and DO NOT stop taking Eliquis without talking to the doctor who prescribed the medication.  Stopping without other medication to take the place of Eliquis may increase your risk of developing a clot.  After discharge, you should have regular check-up appointments with your healthcare provider that is prescribing your Eliquis.  What do you do if you miss a dose? If a dose of ELIQUIS is not taken at the scheduled time, take it as soon as possible on the same day and twice-daily administration  should be resumed.  The dose should not be doubled to make up for a missed dose.  Do not take more than one tablet of ELIQUIS at the same time.  Important Safety Information A possible side effect of Eliquis is bleeding. You should call your healthcare provider right away if you experience any of the following: Bleeding from an injury or your nose that does not stop. Unusual colored urine (red or dark brown) or unusual colored stools (red or black). Unusual bruising for unknown reasons. A serious fall or if you hit your head (even if there is no bleeding).  Some medicines may interact with Eliquis and might increase your risk of bleeding or clotting while on Eliquis. To help avoid this, consult your healthcare provider or pharmacist prior to using any new prescription or non-prescription medications, including herbals, vitamins, non-steroidal anti-inflammatory drugs (NSAIDs) and supplements.  This website has more information on Eliquis (apixaban): http://www.eliquis.com/eliquis/home

## 2023-05-20 ENCOUNTER — Encounter: Payer: Self-pay | Admitting: Surgery

## 2023-05-20 DIAGNOSIS — M1711 Unilateral primary osteoarthritis, right knee: Secondary | ICD-10-CM | POA: Diagnosis not present

## 2023-05-20 MED ORDER — CEFAZOLIN SODIUM-DEXTROSE 2-4 GM/100ML-% IV SOLN
INTRAVENOUS | Status: AC
  Filled 2023-05-20: qty 100

## 2023-05-20 MED ORDER — DOCUSATE SODIUM 100 MG PO CAPS
ORAL_CAPSULE | ORAL | Status: AC
  Filled 2023-05-20: qty 1

## 2023-05-20 MED ORDER — PANTOPRAZOLE SODIUM 40 MG PO TBEC
DELAYED_RELEASE_TABLET | ORAL | Status: AC
  Filled 2023-05-20: qty 1

## 2023-05-20 MED ORDER — KETOROLAC TROMETHAMINE 15 MG/ML IJ SOLN
INTRAMUSCULAR | Status: AC
  Filled 2023-05-20: qty 1

## 2023-05-20 MED ORDER — OXYCODONE HCL 5 MG PO TABS
ORAL_TABLET | ORAL | Status: AC
  Filled 2023-05-20: qty 1

## 2023-05-20 MED ORDER — APIXABAN 2.5 MG PO TABS
ORAL_TABLET | ORAL | Status: AC
  Filled 2023-05-20: qty 1

## 2023-05-20 MED ORDER — ACETAMINOPHEN 500 MG PO TABS
ORAL_TABLET | ORAL | Status: AC
  Filled 2023-05-20: qty 2

## 2023-05-20 MED ORDER — PREGABALIN 50 MG PO CAPS
ORAL_CAPSULE | ORAL | Status: AC
  Filled 2023-05-20: qty 3

## 2023-05-20 NOTE — Progress Notes (Signed)
Subjective: 1 Day Post-Op Procedure(s) (LRB): TOTAL KNEE ARTHROPLASTY (Right) Patient reports pain as mild.   Patient is  well but does have difficulty with dorsiflexion of his right foot. Plan is to go Home after hospital stay. Negative for chest pain and shortness of breath Fever: no Gastrointestinal:Negative for nausea and vomiting  Objective: Vital signs in last 24 hours: Temp:  [97.5 F (36.4 C)-98.3 F (36.8 C)] 98.3 F (36.8 C) (08/14 0859) Pulse Rate:  [64-79] 73 (08/14 0859) Resp:  [12-21] 16 (08/14 0859) BP: (101-118)/(64-86) 111/76 (08/14 0859) SpO2:  [95 %-100 %] 96 % (08/14 0859)  Intake/Output from previous day:  Intake/Output Summary (Last 24 hours) at 05/20/2023 1005 Last data filed at 05/20/2023 0914 Gross per 24 hour  Intake 1621.25 ml  Output 1140 ml  Net 481.25 ml    Intake/Output this shift: Total I/O In: -  Out: 400 [Urine:400]  Labs: No results for input(s): "HGB" in the last 72 hours. No results for input(s): "WBC", "RBC", "HCT", "PLT" in the last 72 hours. No results for input(s): "NA", "K", "CL", "CO2", "BUN", "CREATININE", "GLUCOSE", "CALCIUM" in the last 72 hours. No results for input(s): "LABPT", "INR" in the last 72 hours.   EXAM General - Patient is Alert, Appropriate, and Oriented Extremity - ABD soft Intact pulses distally Incision: dressing C/D/I No cellulitis present Compartment soft Patient is intact to light touch over the superficial and deep peroneal nerve distribution however he does have moderate weakness with dorsiflexion of the foot.  Able to plantarflex foot and toes. Dressing/Incision - clean, dry, no drainage noted to the right knee honeycomb dressing. Motor Function - intact, moving foot and toes well on exam.  Abdomen soft with intact bowel sounds this AM.  Past Medical History:  Diagnosis Date   Anxiety    Aortic ectasia (HCC)    Asthma    Barrett's esophagus    BPH (benign prostatic hyperplasia)    COPD  (chronic obstructive pulmonary disease) (HCC)    DDD (degenerative disc disease), lumbar    GERD (gastroesophageal reflux disease)    Headache    Hemorrhoids    Hyperlipidemia    Hyperlipidemia    Hypertension    Insomnia    Mild episode of recurrent major depressive disorder (HCC)    Neuropathy    Neuropathy, peripheral, idiopathic    Osteoarthritis    Primary osteoarthritis of right knee    Smoker    Tobacco use    Umbilical hernia    Vitamin D deficiency     Assessment/Plan: 1 Day Post-Op Procedure(s) (LRB): TOTAL KNEE ARTHROPLASTY (Right) Principal Problem:   Status post total knee replacement using cement, right  Estimated body mass index is 26.83 kg/m as calculated from the following:   Height as of this encounter: 5\' 10"  (1.778 m).   Weight as of this encounter: 84.8 kg. Advance diet Up with therapy D/C IV fluids When tolerating po intake.  Vitals reviewed this AM. Absent/weak dorsiflexion of the right foot.  Intact to touch.  Likely from numbing medication injected at the time of surgery.  Expect to resolve over the next 48-72 hours.  ACE wrap was loosened and the knee was placed in some slight flexion. He has cleared all goals with PT. He states he is urinating well and is passing gas. Plan for discharge home today with HHPT.  DVT Prophylaxis - TED hose and Eliquis Weight-Bearing as tolerated to right leg  J. Horris Latino, PA-C [facility] 05/20/2023,  10:05 AM

## 2023-05-20 NOTE — Progress Notes (Signed)
DISCHARGE NOTE:  Pt and wife given discharge instructions, and verbalized understanding. Pt wheeled to car by staff, wife providing transportation.

## 2023-05-20 NOTE — Progress Notes (Signed)
Pts BP 111/76, pt has Irbesartan scheduled, Lance PA notified, per [facility] hold BP med.

## 2023-05-20 NOTE — Evaluation (Signed)
Physical Therapy Evaluation Patient Details Name: Anthony Bender MRN: [id number] DOB: April 20, 1956 Today's Date: 05/20/2023  History of Present Illness  Pt is a 67 y/o male admitted to the hospital s/p R TKA secondary to worsening R knee pain. PMH includes: Bilateral knee arthroscopy, L partial knee replacement, Barret's esophagus, COPD, HLD, and idiopathic neuropathy.  Clinical Impression  Pt was pleasant and motivated to participate during the session and put forth good effort throughout. Initial session readings of 95% SpO2 on RA and HR 66 bpm. Pt found supine in bed with HOB slightly elevated. Pt Bed mobility IND, little to no cues provided to get seated on EOB. Pt able to perform STS to RW with CGA, amb 90 feet x 2 with stair training in between bouts. Pt progressed to step through pattern during ambulation, cues provided for safety and gait sequencing. Additional cues given for R foot clearance with good carryover within session. Pt only showing trace RLE dorsiflexion activation, with slightly diminished sensation to light touch, MD was notified. Final readings of SpO2 at 94% and HR of 69 bpm. Finished session explaining HEP and car transfers with pt and spouse, pt verbalized understanding. Pt will benefit from continued PT services upon discharge to safely address deficits listed in patient problem list for decreased caregiver assistance and eventual return to PLOF.        If plan is discharge home, recommend the following: A little help with walking and/or transfers;A little help with bathing/dressing/bathroom;Help with stairs or ramp for entrance;Assist for transportation   Can travel by private vehicle        Equipment Recommendations None recommended by PT  Recommendations for Other Services       Functional Status Assessment Patient has had a recent decline in their functional status and demonstrates the ability to make significant improvements in function in a reasonable and  predictable amount of time.     Precautions / Restrictions Precautions Precautions: Knee Precaution Booklet Issued: Yes (comment) Restrictions Weight Bearing Restrictions: Yes RLE Weight Bearing: Weight bearing as tolerated      Mobility  Bed Mobility Overal bed mobility: Independent             General bed mobility comments: able to sit up to EOBwithout use of bed rails    Transfers Overall transfer level: Needs assistance Equipment used: Rolling walker (2 wheels) Transfers: Sit to/from Stand Sit to Stand: Contact guard assist           General transfer comment: Pt cued for RLE placement, pt able to follow with no physical assist needed from PT    Ambulation/Gait Ambulation/Gait assistance: Contact guard assist Gait Distance (Feet): 90 Feet x 2 Assistive device: Rolling walker (2 wheels) Gait Pattern/deviations: Step-to pattern, Step-through pattern, Decreased dorsiflexion - right, Decreased stride length, Decreased step length - right, Decreased step length - left Gait velocity: decreased     General Gait Details: Inital step to pattern, cues given to keep walker rolling with consistent steps; pt able to progress to step-through. Pt given cues for foot clearence on RLE due to decreased dorsiflexion activation at this time.  Stairs Stairs: Yes Stairs assistance: Contact guard assist Stair Management: Backwards, With walker Number of Stairs: 1 General stair comments: Pt preformed 1 step x 2 for practice with good carryover from education and demonstration provided. Cues for proper sequencing and safety given with additional cues to clear R foot from step. Pt spouse present during training and education.  Wheelchair Mobility  Tilt Bed    Modified Rankin (Stroke Patients Only)       Balance Overall balance assessment: Needs assistance Sitting-balance support: Feet supported, No upper extremity supported Sitting balance-Leahy Scale: Good      Standing balance support: During functional activity, Reliant on assistive device for balance, Bilateral upper extremity supported Standing balance-Leahy Scale: Good Standing balance comment: static standing with RW                             Pertinent Vitals/Pain Pain Assessment Pain Assessment: 0-10 Pain Score: 6  Pain Location: R knee (after walking to stairs) Pain Descriptors / Indicators: Aching Pain Intervention(s): Monitored during session, Limited activity within patient's tolerance    Home Living Family/patient expects to be discharged to:: Private residence Living Arrangements: Spouse/significant other Available Help at Discharge: Available 24 hours/day Type of Home: House Home Access: Stairs to enter Entrance Stairs-Rails: None Entrance Stairs-Number of Steps: 2 steps with landing between   Home Layout: Able to live on main level with bedroom/bathroom Home Equipment: Agricultural consultant (2 wheels);Shower seat - built in;BSC/3in1      Prior Function Prior Level of Function : Independent/Modified Independent;Working/employed;History of Falls (last six months) (Fall Hx: one mild stumble months ago)             Mobility Comments: Job requires him to be very mobile, alot of walking ADLs Comments: Independent     Extremity/Trunk Assessment   Upper Extremity Assessment Upper Extremity Assessment: Overall WFL for tasks assessed    Lower Extremity Assessment Lower Extremity Assessment: Generalized weakness;RLE deficits/detail RLE Deficits / Details: Trace amounts of dorsiflexion, not full activation yet. RLE Sensation: decreased light touch    Cervical / Trunk Assessment Cervical / Trunk Assessment: Normal  Communication   Communication Communication: No apparent difficulties  Cognition Arousal: Alert Behavior During Therapy: WFL for tasks assessed/performed Overall Cognitive Status: Within Functional Limits for tasks assessed                                           General Comments      Exercises Total Joint Exercises Straight Leg Raises: AROM, Strengthening, Right, 5 reps Long Arc Quad: 5 reps, Right, AROM, Strengthening Knee Flexion: AROM, Strengthening, Right, 5 reps Goniometric ROM: R knee AROM: 1-115 deg Marching in Standing: 5 reps, Both, AROM Other Exercises Other Exercises: Pt and spouse educated on car transfer safety; verbalized understanding, pt educated on R knee positioning to encourage R knee extension at rest.   Assessment/Plan    PT Assessment Patient needs continued PT services  PT Problem List Decreased strength;Decreased coordination;Decreased range of motion;Decreased activity tolerance;Decreased balance;Decreased mobility       PT Treatment Interventions DME instruction;Balance training;Gait training;Stair training;Functional mobility training;Therapeutic activities;Therapeutic exercise    PT Goals (Current goals can be found in the Care Plan section)  Acute Rehab PT Goals Patient Stated Goal: get better; be able to play with grandkids PT Goal Formulation: With patient Time For Goal Achievement: 06/02/23 Potential to Achieve Goals: Good    Frequency BID     Co-evaluation               AM-PAC PT "6 Clicks" Mobility  Outcome Measure Help needed turning from your back to your side while in a flat bed without using bedrails?: None Help needed moving from lying  on your back to sitting on the side of a flat bed without using bedrails?: None Help needed moving to and from a bed to a chair (including a wheelchair)?: A Little Help needed standing up from a chair using your arms (e.g., wheelchair or bedside chair)?: None Help needed to walk in hospital room?: A Little Help needed climbing 3-5 steps with a railing? : A Little 6 Click Score: 21    End of Session Equipment Utilized During Treatment: Gait belt Activity Tolerance: Patient tolerated treatment well Patient left: in  chair;with family/visitor present Nurse Communication: Mobility status PT Visit Diagnosis: Other abnormalities of gait and mobility (R26.89);Muscle weakness (generalized) (M62.81);Pain Pain - Right/Left: Right Pain - part of body: Knee    Time: 1610-9604 PT Time Calculation (min) (ACUTE ONLY): 40 min   Charges:                 Cecile Sheerer, SPT 05/20/23, 1:20 PM

## 2023-05-20 NOTE — Plan of Care (Signed)
°  Problem: Activity: °Goal: Risk for activity intolerance will decrease °Outcome: Progressing °  °Problem: Elimination: °Goal: Will not experience complications related to urinary retention °Outcome: Progressing °  °Problem: Pain Managment: °Goal: General experience of comfort will improve °Outcome: Progressing °  °Problem: Safety: °Goal: Ability to remain free from injury will improve °Outcome: Progressing °  °

## 2023-05-20 NOTE — Discharge Summary (Signed)
Physician Discharge Summary  Patient ID: Anthony Bender MRN: [id number] DOB/AGE: 68-Oct-1957 68 y.o.  Admit date: 05/19/2023 Discharge date: 05/20/2023  Admission Diagnoses:  Status post total knee replacement using cement, right [Z96.651] Right knee degenerative joint disease  Discharge Diagnoses: Patient Active Problem List   Diagnosis Date Noted   Status post total knee replacement using cement, right 05/19/2023   Gastric ulcer 12/04/2022   UGIB (upper gastrointestinal bleed) 12/03/2022   Hypokalemia 12/03/2022   Open wound of fifth toe of left foot, initial encounter 09/05/2022   Atypical chest pain 04/27/2018   Asthma without status asthmaticus 01/12/2018   Benign prostatic hyperplasia 01/12/2018   Barrett's esophagus 01/12/2018   Hyperlipidemia, unspecified 01/12/2018   Claudication of right lower extremity (HCC) 03/18/2016   DDD (degenerative disc disease), cervical 03/18/2016   Recurrent pain of both knees 03/12/2016   Low back pain with radiation 12/18/2015   Personal history of tobacco use, presenting hazards to health 10/12/2015   Chronic insomnia 08/24/2015   Presence of left artificial knee joint 07/03/2015   Primary osteoarthritis of right knee 03/07/2015   COPD (chronic obstructive pulmonary disease) (HCC) 03/21/2014   Hemorrhoids 03/21/2014   Neuropathy, peripheral, idiopathic 03/21/2014   Hypertension 03/21/2014   Vitamin D deficiency, unspecified 03/21/2014    Past Medical History:  Diagnosis Date   Anxiety    Aortic ectasia (HCC)    Asthma    Barrett's esophagus    BPH (benign prostatic hyperplasia)    COPD (chronic obstructive pulmonary disease) (HCC)    DDD (degenerative disc disease), lumbar    GERD (gastroesophageal reflux disease)    Headache    Hemorrhoids    Hyperlipidemia    Hyperlipidemia    Hypertension    Insomnia    Mild episode of recurrent major depressive disorder (HCC)    Neuropathy    Neuropathy, peripheral, idiopathic     Osteoarthritis    Primary osteoarthritis of right knee    Smoker    Tobacco use    Umbilical hernia    Vitamin D deficiency      Transfusion: None.   Consultants (if any):   Discharged Condition: Improved  Hospital Course: Anthony Bender is an 67 y.o. male who was admitted 05/19/2023 with a diagnosis of right knee degenerative joint disease and went to the operating room on 05/19/2023 and underwent the above named procedures.    Surgeries: Procedure(s): TOTAL KNEE ARTHROPLASTY on 05/19/2023 Patient tolerated the surgery well. Taken to PACU where she was stabilized and then transferred to the post-op recovery area for overnight observation  Started on Eliquis 2.5mg  q 12 hrs. Heels elevated on bed with rolled towels. No evidence of DVT. Negative Homan. Physical therapy started on day #1 for gait training and transfer. OT started day #1 for ADL and assisted devices.  Patient was noted to have absent dorsiflexion of the right foot.  Sensation was intact to touch to the right foot including over the superficial and deep peroneal nerves.  Instructed patient this is likely from the numbing medication injected at the time of surgery and expectation is that this will resolved over the next 48-72 hours.  Patient's IV was removed on POD1.  Implants: Right TKA using all-cemented Zimmer Persona system with a #10 PCR femur, a(n) F-sized  tibial tray with a 12 mm medial congruent E-poly insert, and a 9.5 x 38 mm all-poly 3-pegged domed patella.   He was given perioperative antibiotics:  Anti-infectives (From admission, onward)  Start     Dose/Rate Route Frequency Ordered Stop   05/19/23 2200  ceFAZolin (ANCEF) IVPB 2g/100 mL premix        2 g 200 mL/hr over 30 Minutes Intravenous Every 6 hours 05/19/23 1654 05/20/23 1559   05/19/23 1400  ceFAZolin (ANCEF) IVPB 2g/100 mL premix  Status:  Discontinued        2 g 200 mL/hr over 30 Minutes Intravenous Every 6 hours 05/19/23 1316 05/19/23 1703    05/19/23 0600  ceFAZolin (ANCEF) IVPB 2g/100 mL premix        2 g 200 mL/hr over 30 Minutes Intravenous On call to O.R. 05/18/23 2146 05/19/23 1131     .  He was given sequential compression devices, early ambulation, and Eliquis for DVT prophylaxis.  He benefited maximally from the hospital stay and there were no complications.    Recent vital signs:  Vitals:   05/19/23 1930 05/20/23 0859  BP: 118/78 111/76  Pulse: 76 73  Resp: 18 16  Temp: (!) 97.5 F (36.4 C) 98.3 F (36.8 C)  SpO2: 96% 96%    Recent laboratory studies:  Lab Results  Component Value Date   HGB 14.8 05/08/2023   HGB 15.7 01/07/2023   HGB 12.6 (L) 12/04/2022   Lab Results  Component Value Date   WBC 8.5 05/08/2023   PLT 210 05/08/2023   Lab Results  Component Value Date   INR 1.0 12/03/2022   Lab Results  Component Value Date   NA 139 05/08/2023   K 3.2 (L) 05/08/2023   CL 105 05/08/2023   CO2 25 05/08/2023   BUN 10 05/08/2023   CREATININE 0.76 05/08/2023   GLUCOSE 127 (H) 05/08/2023    Discharge Medications:   Allergies as of 05/20/2023       Reactions   Yellow Jacket Venom [bee Venom] Anaphylaxis, Shortness Of Breath   Codeine Itching   Metaxalone Swelling   Venlafaxine Other (See Comments)   Loss of appetite, jittery   Zyban [bupropion] Other (See Comments)   hallucinations   Fluoxetine Anxiety   anger        Medication List     TAKE these medications    albuterol 108 (90 Base) MCG/ACT inhaler Commonly known as: VENTOLIN HFA Inhale into the lungs every 6 (six) hours as needed for wheezing or shortness of breath.   Albuterol Sulfate 2.5 MG/0.5ML Nebu Inhale 1 each into the lungs every 6 (six) hours as needed (SOB).   apixaban 2.5 MG Tabs tablet Commonly known as: Eliquis Take 1 tablet (2.5 mg total) by mouth 2 (two) times daily.   DULoxetine 60 MG capsule Commonly known as: CYMBALTA Take 60 mg by mouth daily.   fluticasone 50 MCG/ACT nasal spray Commonly known  as: FLONASE Place 2 sprays into both nostrils 2 (two) times daily.   Lyrica 150 MG capsule Generic drug: pregabalin Take 150 mg by mouth 3 (three) times daily.   nortriptyline 10 MG capsule Commonly known as: PAMELOR Take 20 mg by mouth at bedtime.   Omega-3-Acid Eth Est (Dietary) 1 g Caps Take 1 g by mouth 2 (two) times daily.   omeprazole 20 MG tablet Commonly known as: PriLOSEC OTC Take 2 tablets (40 mg total) by mouth in the morning and at bedtime.   oxyCODONE 5 MG immediate release tablet Commonly known as: Roxicodone Take 1-2 tablets (5-10 mg total) by mouth every 4 (four) hours as needed for moderate pain or severe pain.   potassium chloride  SA 20 MEQ tablet Commonly known as: KLOR-CON M Take 20 mEq by mouth 2 (two) times daily.   rOPINIRole 0.5 MG tablet Commonly known as: REQUIP Take 0.5 mg by mouth 4 (four) times daily as needed.   rosuvastatin 10 MG tablet Commonly known as: CRESTOR Take 10 mg by mouth at bedtime.   telmisartan 80 MG tablet Commonly known as: MICARDIS Take 40 mg by mouth daily.   Trelegy Ellipta 100-62.5-25 MCG/ACT Aepb Generic drug: Fluticasone-Umeclidin-Vilant Inhale 1 puff into the lungs daily.   zolpidem 10 MG tablet Commonly known as: AMBIEN Take 0.5 tablets (5 mg total) by mouth at bedtime. PATIENT HAS SUPPLIES               Durable Medical Equipment  (From admission, onward)           Start     Ordered   05/19/23 1655  DME 3 n 1  Once        05/19/23 1654   05/19/23 1655  DME Walker rolling  Once       Question Answer Comment  Walker: With 5 Inch Wheels   Patient needs a walker to treat with the following condition Status post total knee replacement using cement, right      05/19/23 1654   05/19/23 1655  DME Bedside commode  Once       Question:  Patient needs a bedside commode to treat with the following condition  Answer:  Status post total knee replacement using cement, right   05/19/23 1654            Diagnostic Studies: DG Knee Right Port  Result Date: 05/19/2023 CLINICAL DATA:  Status post total knee replacement, right. EXAM: PORTABLE RIGHT KNEE - 1-2 VIEW COMPARISON:  Preoperative MRI 05/05/2023 FINDINGS: Right knee arthroplasty in expected alignment. No periprosthetic lucency or fracture. There has been patellar resurfacing. Recent postsurgical change includes air and edema in the soft tissues and joint space. Anterior skin staples in place. IMPRESSION: Right knee arthroplasty without immediate postoperative complication. Electronically Signed   By: Narda Rutherford M.D.   On: 05/19/2023 16:36   MR KNEE RIGHT WO CONTRAST  Result Date: 05/08/2023 CLINICAL DATA:  Chronic, progressively worsening right knee pain. History of prior arthroscopic surgery. EXAM: MRI OF THE RIGHT KNEE WITHOUT CONTRAST TECHNIQUE: Multiplanar, multisequence MR imaging of the knee was performed. No intravenous contrast was administered. COMPARISON:  None Available. FINDINGS: MENISCI Medial meniscus: Blunted, diminutive body and posterior horn, consistent with prior partial meniscectomy. Complex tear of the posterior horn near the root (series 6, images 18-19). Lateral meniscus:  Intact. LIGAMENTS Cruciates:  Chronically torn ACL.  Intact PCL. Collaterals: Medial collateral ligament is intact. Lateral collateral ligament complex is intact. CARTILAGE Patellofemoral:  Mild partial-thickness cartilage loss. Medial: Extensive full-thickness cartilage loss over the weight-bearing medial femoral condyle and medial tibial plateau. Lateral: Full-thickness cartilage loss over the mesial aspect of the lateral femoral condyle. Joint: Small joint effusion. 1.7 cm calcified intra-articular body in the suprapatellar joint space. Mild edema within the posterior aspect of Hoffa's fat. Popliteal Fossa:  No Baker cyst. Intact popliteus tendon. Extensor Mechanism: Intact quadriceps tendon and patellar tendon. Intact medial and lateral patellar  retinaculum. Intact MPFL. Bones: No acute fracture or dislocation. No suspicious bone lesion. Tricompartmental marginal osteophytes. Other: None. IMPRESSION: 1. Prior partial medial meniscectomy with complex tear of the posterior horn near the root. 2. Chronically torn ACL. 3. Tricompartmental osteoarthritis, severe in the medial compartment. 4. Small joint  effusion with 1.7 cm calcified intra-articular body in the suprapatellar joint space. Electronically Signed   By: Obie Dredge M.D.   On: 05/08/2023 16:02    Disposition: Plan for discharge home today with HHPT.   Follow-up Information     Anson [place], PA-C Follow up in 14 day(s).   Specialty: Physician Assistant Why: Mindi Slicker information: [address] [place] [address] [phone]                Signed: Meriel Pica PA-C 05/20/2023, 10:12 AM

## 2023-05-20 NOTE — Plan of Care (Signed)
Documented

## 2023-05-20 NOTE — Anesthesia Postprocedure Evaluation (Signed)
Anesthesia Post Note  Patient: Anthony Bender  Procedure(s) Performed: TOTAL KNEE ARTHROPLASTY (Right: Knee)  Patient location during evaluation: Nursing Unit Anesthesia Type: Spinal Level of consciousness: oriented and awake and alert Pain management: pain level controlled Vital Signs Assessment: post-procedure vital signs reviewed and stable Respiratory status: spontaneous breathing and respiratory function stable Cardiovascular status: blood pressure returned to baseline and stable Postop Assessment: no headache, no backache, no apparent nausea or vomiting and patient able to bend at knees Anesthetic complications: no   No notable events documented.   Last Vitals:  Vitals:   05/19/23 1630 05/19/23 1930  BP: 111/75 118/78  Pulse: 68 76  Resp: 14 18  Temp: 36.5 C (!) 36.4 C  SpO2: 97% 96%    Last Pain:  Vitals:   05/20/23 0201  TempSrc:   PainSc: Asleep                 Karoline Caldwell

## 2023-05-22 ENCOUNTER — Encounter: Payer: Self-pay | Admitting: Surgery

## 2023-05-28 ENCOUNTER — Emergency Department: Payer: No Typology Code available for payment source

## 2023-05-28 ENCOUNTER — Inpatient Hospital Stay
Admission: EM | Admit: 2023-05-28 | Discharge: 2023-06-04 | DRG: 485 | Disposition: A | Discharge status: Home Health | Payer: No Typology Code available for payment source | Attending: Internal Medicine | Admitting: Internal Medicine

## 2023-05-28 ENCOUNTER — Encounter: Payer: Self-pay | Admitting: Intensive Care

## 2023-05-28 ENCOUNTER — Other Ambulatory Visit: Payer: Self-pay

## 2023-05-28 DIAGNOSIS — Z8249 Family history of ischemic heart disease and other diseases of the circulatory system: Secondary | ICD-10-CM

## 2023-05-28 DIAGNOSIS — T8453XA Infection and inflammatory reaction due to internal right knee prosthesis, initial encounter: Principal | ICD-10-CM | POA: Diagnosis present

## 2023-05-28 DIAGNOSIS — F17210 Nicotine dependence, cigarettes, uncomplicated: Secondary | ICD-10-CM | POA: Diagnosis present

## 2023-05-28 DIAGNOSIS — G2581 Restless legs syndrome: Secondary | ICD-10-CM | POA: Diagnosis present

## 2023-05-28 DIAGNOSIS — Z7901 Long term (current) use of anticoagulants: Secondary | ICD-10-CM

## 2023-05-28 DIAGNOSIS — R5383 Other fatigue: Secondary | ICD-10-CM | POA: Diagnosis not present

## 2023-05-28 DIAGNOSIS — Z87891 Personal history of nicotine dependence: Secondary | ICD-10-CM

## 2023-05-28 DIAGNOSIS — Z885 Allergy status to narcotic agent status: Secondary | ICD-10-CM

## 2023-05-28 DIAGNOSIS — Y792 Prosthetic and other implants, materials and accessory orthopedic devices associated with adverse incidents: Secondary | ICD-10-CM | POA: Diagnosis present

## 2023-05-28 DIAGNOSIS — M25061 Hemarthrosis, right knee: Secondary | ICD-10-CM | POA: Diagnosis present

## 2023-05-28 DIAGNOSIS — J449 Chronic obstructive pulmonary disease, unspecified: Secondary | ICD-10-CM | POA: Diagnosis present

## 2023-05-28 DIAGNOSIS — R439 Unspecified disturbances of smell and taste: Secondary | ICD-10-CM | POA: Diagnosis present

## 2023-05-28 DIAGNOSIS — F32A Depression, unspecified: Secondary | ICD-10-CM | POA: Insufficient documentation

## 2023-05-28 DIAGNOSIS — Z96651 Presence of right artificial knee joint: Secondary | ICD-10-CM

## 2023-05-28 DIAGNOSIS — D72829 Elevated white blood cell count, unspecified: Secondary | ICD-10-CM

## 2023-05-28 DIAGNOSIS — Z888 Allergy status to other drugs, medicaments and biological substances status: Secondary | ICD-10-CM

## 2023-05-28 DIAGNOSIS — F5104 Psychophysiologic insomnia: Secondary | ICD-10-CM | POA: Diagnosis present

## 2023-05-28 DIAGNOSIS — Z79899 Other long term (current) drug therapy: Secondary | ICD-10-CM

## 2023-05-28 DIAGNOSIS — A4150 Gram-negative sepsis, unspecified: Secondary | ICD-10-CM | POA: Diagnosis present

## 2023-05-28 DIAGNOSIS — Z833 Family history of diabetes mellitus: Secondary | ICD-10-CM

## 2023-05-28 DIAGNOSIS — A419 Sepsis, unspecified organism: Secondary | ICD-10-CM

## 2023-05-28 DIAGNOSIS — G609 Hereditary and idiopathic neuropathy, unspecified: Secondary | ICD-10-CM | POA: Diagnosis present

## 2023-05-28 DIAGNOSIS — F419 Anxiety disorder, unspecified: Secondary | ICD-10-CM | POA: Diagnosis present

## 2023-05-28 DIAGNOSIS — G8918 Other acute postprocedural pain: Secondary | ICD-10-CM | POA: Diagnosis present

## 2023-05-28 DIAGNOSIS — R6883 Chills (without fever): Secondary | ICD-10-CM | POA: Diagnosis not present

## 2023-05-28 DIAGNOSIS — Z7951 Long term (current) use of inhaled steroids: Secondary | ICD-10-CM

## 2023-05-28 DIAGNOSIS — E872 Acidosis, unspecified: Secondary | ICD-10-CM | POA: Diagnosis present

## 2023-05-28 DIAGNOSIS — D649 Anemia, unspecified: Secondary | ICD-10-CM | POA: Diagnosis not present

## 2023-05-28 DIAGNOSIS — R6520 Severe sepsis without septic shock: Secondary | ICD-10-CM | POA: Diagnosis present

## 2023-05-28 DIAGNOSIS — E785 Hyperlipidemia, unspecified: Secondary | ICD-10-CM | POA: Diagnosis not present

## 2023-05-28 DIAGNOSIS — I77819 Aortic ectasia, unspecified site: Secondary | ICD-10-CM | POA: Diagnosis present

## 2023-05-28 DIAGNOSIS — I10 Essential (primary) hypertension: Secondary | ICD-10-CM | POA: Diagnosis present

## 2023-05-28 DIAGNOSIS — Z96652 Presence of left artificial knee joint: Secondary | ICD-10-CM | POA: Diagnosis not present

## 2023-05-28 DIAGNOSIS — Z89422 Acquired absence of other left toe(s): Secondary | ICD-10-CM

## 2023-05-28 DIAGNOSIS — N400 Enlarged prostate without lower urinary tract symptoms: Secondary | ICD-10-CM | POA: Diagnosis present

## 2023-05-28 DIAGNOSIS — R1110 Vomiting, unspecified: Secondary | ICD-10-CM | POA: Diagnosis present

## 2023-05-28 DIAGNOSIS — R7989 Other specified abnormal findings of blood chemistry: Secondary | ICD-10-CM

## 2023-05-28 DIAGNOSIS — K219 Gastro-esophageal reflux disease without esophagitis: Secondary | ICD-10-CM | POA: Diagnosis present

## 2023-05-28 DIAGNOSIS — T888XXA Other specified complications of surgical and medical care, not elsewhere classified, initial encounter: Secondary | ICD-10-CM

## 2023-05-28 DIAGNOSIS — J4489 Other specified chronic obstructive pulmonary disease: Secondary | ICD-10-CM | POA: Diagnosis present

## 2023-05-28 DIAGNOSIS — I2489 Other forms of acute ischemic heart disease: Secondary | ICD-10-CM | POA: Diagnosis present

## 2023-05-28 DIAGNOSIS — Z1152 Encounter for screening for COVID-19: Secondary | ICD-10-CM | POA: Diagnosis not present

## 2023-05-28 DIAGNOSIS — Z91030 Bee allergy status: Secondary | ICD-10-CM

## 2023-05-28 LAB — CBC
HCT: 50.1 % (ref 39.0–52.0)
Hemoglobin: 16.9 g/dL (ref 13.0–17.0)
MCH: 31.2 pg (ref 26.0–34.0)
MCHC: 33.7 g/dL (ref 30.0–36.0)
MCV: 92.6 fL (ref 80.0–100.0)
Platelets: 454 10*3/uL — ABNORMAL HIGH (ref 150–400)
RBC: 5.41 MIL/uL (ref 4.22–5.81)
RDW: 15.2 % (ref 11.5–15.5)
WBC: 18.6 10*3/uL — ABNORMAL HIGH (ref 4.0–10.5)
nRBC: 0 % (ref 0.0–0.2)

## 2023-05-28 LAB — HEPATIC FUNCTION PANEL
ALT: 22 U/L (ref 0–44)
AST: 29 U/L (ref 15–41)
Albumin: 4.3 g/dL (ref 3.5–5.0)
Alkaline Phosphatase: 97 U/L (ref 38–126)
Bilirubin, Direct: 0.3 mg/dL — ABNORMAL HIGH (ref 0.0–0.2)
Indirect Bilirubin: 1.5 mg/dL — ABNORMAL HIGH (ref 0.3–0.9)
Total Bilirubin: 1.8 mg/dL — ABNORMAL HIGH (ref 0.3–1.2)
Total Protein: 8.3 g/dL — ABNORMAL HIGH (ref 6.5–8.1)

## 2023-05-28 LAB — RESP PANEL BY RT-PCR (RSV, FLU A&B, COVID)  RVPGX2
Influenza A by PCR: NEGATIVE
Influenza B by PCR: NEGATIVE
Resp Syncytial Virus by PCR: NEGATIVE
SARS Coronavirus 2 by RT PCR: NEGATIVE

## 2023-05-28 LAB — LACTIC ACID, PLASMA
Lactic Acid, Venous: 2.6 mmol/L (ref 0.5–1.9)
Lactic Acid, Venous: 2.2 mmol/L (ref 0.5–1.9)
Lactic Acid, Venous: 1.3 mmol/L (ref 0.5–1.9)

## 2023-05-28 LAB — BLOOD GAS, VENOUS
Acid-Base Excess: 0.3 mmol/L (ref 0.0–2.0)
Bicarbonate: 23.5 mmol/L (ref 20.0–28.0)
O2 Saturation: 76.1 %
Patient temperature: 37
pCO2, Ven: 33 mmHg — ABNORMAL LOW (ref 44–60)
pH, Ven: 7.46 — ABNORMAL HIGH (ref 7.25–7.43)
pO2, Ven: 42 mmHg (ref 32–45)

## 2023-05-28 LAB — TROPONIN I (HIGH SENSITIVITY)
Troponin I (High Sensitivity): 86 ng/L — ABNORMAL HIGH (ref <18)
Troponin I (High Sensitivity): 90 ng/L — ABNORMAL HIGH (ref <18)
Troponin I (High Sensitivity): 99 ng/L — ABNORMAL HIGH (ref <18)

## 2023-05-28 LAB — BASIC METABOLIC PANEL
Anion gap: 10 (ref 5–15)
BUN: 23 mg/dL (ref 8–23)
CO2: 20 mmol/L — ABNORMAL LOW (ref 22–32)
Calcium: 9.6 mg/dL (ref 8.9–10.3)
Chloride: 104 mmol/L (ref 98–111)
Creatinine, Ser: 0.96 mg/dL (ref 0.61–1.24)
GFR, Estimated: >60 mL/min (ref >60)
Glucose, Bld: 128 mg/dL — ABNORMAL HIGH (ref 70–99)
Potassium: 3.5 mmol/L (ref 3.5–5.1)
Sodium: 134 mmol/L — ABNORMAL LOW (ref 135–145)

## 2023-05-28 LAB — LIPASE, BLOOD: Lipase: 44 U/L (ref 11–51)

## 2023-05-28 LAB — PROTIME-INR
INR: 1 (ref 0.8–1.2)
Prothrombin Time: 13.9 seconds (ref 11.4–15.2)

## 2023-05-28 LAB — SARS CORONAVIRUS 2 BY RT PCR: SARS Coronavirus 2 by RT PCR: NEGATIVE

## 2023-05-28 MED ORDER — SODIUM CHLORIDE 0.9 % IV SOLN
2.0000 g | Freq: Once | INTRAVENOUS | Status: AC
  Administered 2023-05-28: 2 g via INTRAVENOUS
  Filled 2023-05-28: qty 12.5

## 2023-05-28 MED ORDER — VANCOMYCIN HCL IN DEXTROSE 1-5 GM/200ML-% IV SOLN
1000.0000 mg | Freq: Once | INTRAVENOUS | Status: AC
  Administered 2023-05-28: 1000 mg via INTRAVENOUS
  Filled 2023-05-28: qty 200

## 2023-05-28 MED ORDER — LACTATED RINGERS IV BOLUS
1000.0000 mL | Freq: Once | INTRAVENOUS | Status: AC
  Administered 2023-05-28: 1000 mL via INTRAVENOUS

## 2023-05-28 MED ORDER — VANCOMYCIN HCL 750 MG/150ML IV SOLN
750.0000 mg | Freq: Once | INTRAVENOUS | Status: AC
  Administered 2023-05-28: 750 mg via INTRAVENOUS
  Filled 2023-05-28: qty 150

## 2023-05-28 MED ORDER — SODIUM CHLORIDE 0.9 % IV SOLN
2.0000 g | Freq: Three times a day (TID) | INTRAVENOUS | Status: DC
  Administered 2023-05-28 – 2023-06-04 (×20): 2 g via INTRAVENOUS
  Filled 2023-05-28 (×21): qty 12.5

## 2023-05-28 MED ORDER — VANCOMYCIN HCL IN DEXTROSE 1-5 GM/200ML-% IV SOLN
1000.0000 mg | Freq: Two times a day (BID) | INTRAVENOUS | Status: DC
  Administered 2023-05-29 – 2023-06-02 (×9): 1000 mg via INTRAVENOUS
  Filled 2023-05-28 (×11): qty 200

## 2023-05-28 MED ORDER — LACTATED RINGERS IV BOLUS (SEPSIS)
1000.0000 mL | Freq: Once | INTRAVENOUS | Status: AC
  Administered 2023-05-28: 1000 mL via INTRAVENOUS

## 2023-05-28 MED ORDER — DULOXETINE HCL 30 MG PO CPEP
60.0000 mg | ORAL_CAPSULE | Freq: Every day | ORAL | Status: DC
  Administered 2023-05-29 – 2023-06-04 (×7): 60 mg via ORAL
  Filled 2023-05-28 (×7): qty 2

## 2023-05-28 MED ORDER — ROSUVASTATIN CALCIUM 10 MG PO TABS
10.0000 mg | ORAL_TABLET | Freq: Every day | ORAL | Status: DC
  Administered 2023-05-28 – 2023-06-03 (×7): 10 mg via ORAL
  Filled 2023-05-28 (×8): qty 1

## 2023-05-28 MED ORDER — IPRATROPIUM-ALBUTEROL 0.5-2.5 (3) MG/3ML IN SOLN: 3.0000 mL | Freq: Four times a day (QID) | RESPIRATORY_TRACT | Status: AC | PRN

## 2023-05-28 MED ORDER — METRONIDAZOLE 500 MG/100ML IV SOLN
500.0000 mg | Freq: Two times a day (BID) | INTRAVENOUS | Status: DC
  Administered 2023-05-28 – 2023-06-02 (×10): 500 mg via INTRAVENOUS
  Filled 2023-05-28 (×12): qty 100

## 2023-05-28 MED ORDER — ONDANSETRON HCL 4 MG PO TABS: 4.0000 mg | ORAL_TABLET | Freq: Four times a day (QID) | ORAL | Status: DC | PRN

## 2023-05-28 MED ORDER — FENTANYL CITRATE PF 50 MCG/ML IJ SOSY
50.0000 ug | PREFILLED_SYRINGE | Freq: Once | INTRAMUSCULAR | Status: AC
  Administered 2023-05-28: 50 ug via INTRAVENOUS
  Filled 2023-05-28: qty 1

## 2023-05-28 MED ORDER — SENNOSIDES-DOCUSATE SODIUM 8.6-50 MG PO TABS: 1.0000 | ORAL_TABLET | Freq: Every evening | ORAL | Status: DC | PRN

## 2023-05-28 MED ORDER — ACETAMINOPHEN 650 MG RE SUPP: 650.0000 mg | Freq: Four times a day (QID) | RECTAL | Status: DC | PRN

## 2023-05-28 MED ORDER — MORPHINE SULFATE (PF) 4 MG/ML IV SOLN
4.0000 mg | INTRAVENOUS | Status: DC | PRN
  Administered 2023-05-29: 4 mg via INTRAVENOUS
  Filled 2023-05-28: qty 1

## 2023-05-28 MED ORDER — HEPARIN SODIUM (PORCINE) 5000 UNIT/ML IJ SOLN: 5000.0000 [IU] | Freq: Three times a day (TID) | INTRAMUSCULAR | Status: DC

## 2023-05-28 MED ORDER — ZOLPIDEM TARTRATE 5 MG PO TABS
5.0000 mg | ORAL_TABLET | Freq: Every evening | ORAL | Status: AC | PRN
  Administered 2023-05-28: 5 mg via ORAL
  Filled 2023-05-28: qty 1

## 2023-05-28 MED ORDER — ACETAMINOPHEN 325 MG PO TABS
650.0000 mg | ORAL_TABLET | Freq: Four times a day (QID) | ORAL | Status: DC | PRN
  Administered 2023-05-28: 650 mg via ORAL
  Filled 2023-05-28: qty 2

## 2023-05-28 MED ORDER — APIXABAN 2.5 MG PO TABS
2.5000 mg | ORAL_TABLET | Freq: Two times a day (BID) | ORAL | Status: DC
  Administered 2023-05-28 – 2023-05-31 (×7): 2.5 mg via ORAL
  Filled 2023-05-28 (×8): qty 1

## 2023-05-28 MED ORDER — OXYCODONE HCL 5 MG PO TABS
5.0000 mg | ORAL_TABLET | Freq: Four times a day (QID) | ORAL | Status: DC | PRN
  Administered 2023-05-28 – 2023-05-29 (×2): 5 mg via ORAL
  Filled 2023-05-28 (×3): qty 1

## 2023-05-28 MED ORDER — ROPINIROLE HCL 1 MG PO TABS: 0.5000 mg | ORAL_TABLET | Freq: Four times a day (QID) | ORAL | Status: DC | PRN

## 2023-05-28 MED ORDER — ONDANSETRON HCL 4 MG/2ML IJ SOLN: 4.0000 mg | Freq: Four times a day (QID) | INTRAMUSCULAR | Status: DC | PRN

## 2023-05-28 MED ORDER — SODIUM CHLORIDE 0.9 % IV SOLN: INTRAVENOUS | Status: DC

## 2023-05-28 NOTE — Assessment & Plan Note (Signed)
-   Duloxetine 60 mg daily resumed 

## 2023-05-28 NOTE — ED Notes (Signed)
Pt right leg is all black and blue from post op. RN notified provider of findings.

## 2023-05-28 NOTE — Progress Notes (Signed)
Pharmacy Antibiotic Note  Anthony Bender is a 67 y.o. male admitted on 05/28/2023 with sepsis. PMH significant for COPD and tobacco use. In ED, patient is afebrile with WBC 18.6. Pharmacy has been consulted for vancomycin and cefepime dosing.  Plan: Give vancomycin 750 mg IV x1 (to complete 1750 mg IV total load) Start vancomycin 1000 mg IV every 12 hours (eAUC 505.3, Scr 0.96, Vd 0.72 L/kg) Start cefepime 2 g IV every 8 hours based on current renal function Monitor renal function, clinical status, culture data, and LOT F/u MRSA PCR and de-escalate antibiotics as clinically appropriate  Height: 5\' 9"  (175.3 cm) Weight: 83.9 kg (185 lb) IBW/kg (Calculated) : 70.7  Temp (24hrs), Avg:98.3 F (36.8 C), Min:98 F (36.7 C), Max:98.5 F (36.9 C)  Recent Labs  Lab 05/28/23 1351 05/28/23 1500  WBC 18.6*  --   CREATININE 0.96  --   LATICACIDVEN 2.6* 2.2*    Estimated Creatinine Clearance: 73.6 mL/min (by C-G formula based on SCr of 0.96 mg/dL).    Allergies  Allergen Reactions   Yellow Jacket Venom [Bee Venom] Anaphylaxis and Shortness Of Breath   Codeine Itching   Metaxalone Swelling   Venlafaxine Other (See Comments)    Loss of appetite, jittery   Zyban [Bupropion] Other (See Comments)    hallucinations   Fluoxetine Anxiety    anger   Antimicrobials this admission: vancomycin 8/22 >>  cefepime 8/22 >>   Dose adjustments this admission: N/A  Microbiology results: 8/22 BCx: pending 8/22 RVP: negative 8/22 MRSA PCR: pending  Thank you for involving pharmacy in this patient's care.   Rockwell Alexandria, PharmD Clinical Pharmacist 05/28/2023 7:11 PM

## 2023-05-28 NOTE — ED Provider Notes (Signed)
[facility] Provider Note    Event Date/Time   First MD Initiated Contact with Patient 05/28/23 1501     (approximate)   History   Chills  HPI  Anthony Bender is a 67 y.o. male   who presents to the emergency department today with primary concern for chills.  The patient has chills and shakes for the past 3 days.  He states over this time he has also had decreased appetite and oral intake.  Today he had some vomiting.  This has been accompanied by some epigastric abdominal pain.  Additionally the patient underwent right knee surgery roughly 10 days ago.  He says his pain has been improving although today he started having pain to that knee again.      Physical Exam   Triage Vital Signs: ED Triage Vitals [05/28/23 1346]  Encounter Vitals Group     BP (!) 132/98     Systolic BP Percentile      Diastolic BP Percentile      Pulse Rate (!) 113     Resp 20     Temp 98 F (36.7 C)     Temp Source Oral     SpO2 100 %     Weight 185 lb (83.9 kg)     Height 5\' 9"  (1.753 m)     Head Circumference      Peak Flow      Pain Score 7     Pain Loc      Pain Education      Exclude from Growth Chart     Most recent vital signs: Vitals:   05/28/23 1346  BP: (!) 132/98  Pulse: (!) 113  Resp: 20  Temp: 98 F (36.7 C)  SpO2: 100%   General: Awake, alert, oriented. CV:  Good peripheral perfusion. Tachycardia Resp:  Normal effort. Lungs clear. Abd:  No distention. Non tender.  ED Results / Procedures / Treatments   Labs (all labs ordered are listed, but only abnormal results are displayed) Labs Reviewed  BASIC METABOLIC PANEL - Abnormal; Notable for the following components:      Result Value   Sodium 134 (*)    CO2 20 (*)    Glucose, Bld 128 (*)    All other components within normal limits  CBC - Abnormal; Notable for the following components:   WBC 18.6 (*)    Platelets 454 (*)    All other components within normal limits  LACTIC ACID,  PLASMA - Abnormal; Notable for the following components:   Lactic Acid, Venous 2.6 (*)    All other components within normal limits  TROPONIN I (HIGH SENSITIVITY) - Abnormal; Notable for the following components:   Troponin I (High Sensitivity) 86 (*)    All other components within normal limits  SARS CORONAVIRUS 2 BY RT PCR  PROTIME-INR  LACTIC ACID, PLASMA     EKG  I, Phineas Semen, attending physician, personally viewed and interpreted this EKG  EKG Time: 1351 Rate: 112 Rhythm: sinus tachycardia Axis: normal Intervals: qtc 444 QRS: narrow ST changes: no st elevation Impression: abnormal ekg   RADIOLOGY I independently interpreted and visualized the CXR. My interpretation: No pneumonia Radiology interpretation:  IMPRESSION:  No acute cardiopulmonary disease     PROCEDURES:  Critical Care performed: Yes  CRITICAL CARE Performed by: Phineas Semen   Total critical care time: 30 minutes  Critical care time was exclusive of separately billable procedures and treating other patients.  Critical care was necessary to treat or prevent imminent or life-threatening deterioration.  Critical care was time spent personally by me on the following activities: development of treatment plan with patient and/or surrogate as well as nursing, discussions with consultants, evaluation of patient's response to treatment, examination of patient, obtaining history from patient or surrogate, ordering and performing treatments and interventions, ordering and review of laboratory studies, ordering and review of radiographic studies, pulse oximetry and re-evaluation of patient's condition.   Procedures    MEDICATIONS ORDERED IN ED: Medications - No data to display   IMPRESSION / MDM / ASSESSMENT AND PLAN / ED COURSE  I reviewed the triage vital signs and the nursing notes.                              Differential diagnosis includes, but is not limited to, covid, pneumonia,  UTI, wound infection  Patient's presentation is most consistent with acute presentation with potential threat to life or bodily function.   The patient is on the cardiac monitor to evaluate for evidence of arrhythmia and/or significant heart rate changes.  Patient presents to the emergency department today because of concerns for chills as well as nausea vomiting today.  Patient's initial vital signs concerning for tachycardia.  Blood work with notable leukocytosis and slight lactic acidosis.  Do have concerns for infection.  Patient will be started on broad-spectrum antibiotics.  Chest x-ray without obvious pneumonia.  COVID test was negative.  Exam of the right knee does show ecchymosis around it and some swelling.  The wound itself however without any foul-smelling discharge or pus.  Appears intact.  However at this time unclear etiology of the patient's likely infection.  I did speak with Dr. Audelia Acton with orthopedic surgery who was kind enough to evaluate the patient.  At this time he has low suspicion for infected knee.  Additionally patient's blood work was notable for elevated troponin.  Repeat essentially stable.  At this time I have low concern for ACS.  I think elevated troponin more likely secondary to underlying infectious process.  Discussed with Dr. Sedalia Muta with the hospitalist service who will plan on admission.    FINAL CLINICAL IMPRESSION(S) / ED DIAGNOSES   Final diagnoses:  Post-op pain  Lactic acidosis  Leukocytosis, unspecified type  Elevated troponin     Note:  This document was prepared using Dragon voice recognition software and may include unintentional dictation errors.    Phineas Semen, MD 05/28/23 Ernestina Columbia

## 2023-05-28 NOTE — Assessment & Plan Note (Signed)
Continue Crestor and Zetia 

## 2023-05-28 NOTE — Assessment & Plan Note (Addendum)
Suspect secondary to sepsis CBC in the a.m.

## 2023-05-28 NOTE — Assessment & Plan Note (Addendum)
 Does not appear to be in acute exacerbation DuoNebs every 6 hours as needed for shortness of breath and wheezing, 3 days ordered

## 2023-05-28 NOTE — Assessment & Plan Note (Signed)
Blood cultures x 2 ordered on admission pending collection at the time of this dictation, I discussed with nursing that this needs to be collected Cefepime, vancomycin, metronidazole

## 2023-05-28 NOTE — ED Triage Notes (Signed)
Patient c/o chills X3 days and emesis X1 this AM. Had right knee surgery 05/19/23.

## 2023-05-28 NOTE — Assessment & Plan Note (Addendum)
Suspect secondary to severe sepsis Low clinical suspicion for ACS as patient denies chest pain and shortness of breath, and EKG was negative for ischemic changes Third troponin timed for 2100 hrs, to allow antibiotics and fluid time to take effect Discussed with cross coverage provider

## 2023-05-28 NOTE — Assessment & Plan Note (Signed)
-   Zolpidem 5 mg nightly as needed for sleep.

## 2023-05-28 NOTE — Assessment & Plan Note (Signed)
On 05/19/2023 EDP consulted orthopedic provider, who states the patient will be followed for monitoring Please see orthopedic consult note

## 2023-05-28 NOTE — H&P (Addendum)
History and Physical   KALLEL HUBBARD ZOX:[id number] DOB: 11-Jul-1956 DOA: 05/28/2023  PCP: Kennis Carina, MD  Outpatient Specialists: Dr. Joice Lofts, orthopedic specialist Patient coming from: Home  I have personally briefly reviewed patient's old medical records in [facility] EMR.  Chief Concern: Generalized weakness, chills, muscle aches  HPI: Mr. Anthony Bender is a 67 year old male with history of depression, anxiety, on anticoagulation with Eliquis, restless leg syndrome, hyperlipidemia, hypertension, insomnia, who presents emergency department for chief concerns of chills for 3 days and vomiting.  Vitals in the ED showed temperature of 98.5, respiration rate of 22, heart rate 95, blood pressure 121/101, SpO2 99% on room air.  Serum Na is 134, potassium 3.5, chloride 104, bicarb 20, BUN of 23, serum creatinine of 0.96, EGFR greater than 60, nonfasting blood glucose 128, WBC 18.6, hemoglobin 16.9, platelets of 454.  Lactic acid was initially 2.6 and on repeat was 2.2.  High sensitive troponin was 86 and on repeat was 90.  COVID/influenza A/influenza B/RSV PCR were negative  ED treatment: Vancomycin, cefepime, fentanyl 50 mcg IV one-time dose, lactated ringer 1 L bolus. ------------------------- At bedside, patient was able to tell me his name, age, location, current calendar year.  He reports he has been having chills, generalized weakness, for about 3 days.  He denies fever, chest pain, shortness of breath, dysuria, hematuria, diarrhea.  He endorses decreased urine output.  He endorses compliance with his home Eliquis and states he has not missed any dosing as prescribed by orthopedic provider.  Social history: He lives at home with his wife.  He endorses tobacco use, smoking half a pack to 1 pack/day.  He denies EtOH and recreational drug use.  He is retired.  ROS: Constitutional: no weight change,  fever, + chills ENT/Mouth: no sore throat, no rhinorrhea Eyes: no eye pain, no  vision changes Cardiovascular: no chest pain, no dyspnea,  no edema, no palpitations Respiratory: no cough, no sputum, no wheezing Gastrointestinal: no nausea, no vomiting, no diarrhea, no constipation Genitourinary: no urinary incontinence, no dysuria, no hematuria Musculoskeletal: no arthralgias, + myalgias Skin: no skin lesions, no pruritus, Neuro: + weakness, no loss of consciousness, no syncope Psych: no anxiety, no depression, + decrease appetite Heme/Lymph: no bruising, no bleeding  ED Course: Discussed with emergency medicine provider, patient requiring hospitalization for chief concerns of severe sepsis.  Assessment/Plan  Principal Problem:   Severe sepsis with acute organ dysfunction (HCC) Active Problems:   Status post total knee replacement using cement, right   Elevated troponin   Benign prostatic hyperplasia   Chronic insomnia   COPD (chronic obstructive pulmonary disease) (HCC)   Hyperlipidemia, unspecified   Leukocytosis   Depression   Assessment and Plan:  * Severe sepsis with acute organ dysfunction (HCC) Blood cultures x 2 ordered on admission pending collection at the time of this dictation, I discussed with nursing that this needs to be collected Cefepime, vancomycin, metronidazole  Elevated troponin Suspect secondary to severe sepsis Low clinical suspicion for ACS as patient denies chest pain and shortness of breath, and EKG was negative for ischemic changes Third troponin timed for 2100 hrs, to allow antibiotics and fluid time to take effect Discussed with cross coverage provider  Status post total knee replacement using cement, right On 05/19/2023 EDP consulted orthopedic provider, who states the patient will be followed for monitoring Please see orthopedic consult note  Depression Duloxetine 60 mg daily resumed  Leukocytosis Suspect secondary to sepsis CBC in the a.m.  Hyperlipidemia,  unspecified Rosuvastatin 10 mg nightly resumed  COPD  (chronic obstructive pulmonary disease) (HCC) Does not appear to be in acute exacerbation DuoNebs every 6 hours as needed for shortness of breath and wheezing, 3 days ordered  Chronic insomnia Zolpidem 5 mg nightly as needed for sleep  Chart reviewed.   Nuclear medicine stress test on 06/17/2019: Estimated ejection fraction is 73%.  Reveals normal myocardial thickening and wall motion.  EKG was negative, nondiagnostic changes.  DVT prophylaxis: Eliquis 2.5 mg p.o. twice daily Code Status: Full code Diet: Heart healthy Family Communication: Updated spouse at bedside with patient's permission Disposition Plan: Pending clinical course Consults called: None at this time Admission status: PCU, inpatient  Past Medical History:  Diagnosis Date   Anxiety    Aortic ectasia (HCC)    Asthma    Barrett's esophagus    BPH (benign prostatic hyperplasia)    COPD (chronic obstructive pulmonary disease) (HCC)    DDD (degenerative disc disease), lumbar    GERD (gastroesophageal reflux disease)    Headache    Hemorrhoids    Hyperlipidemia    Hyperlipidemia    Hypertension    Insomnia    Mild episode of recurrent major depressive disorder (HCC)    Neuropathy    Neuropathy, peripheral, idiopathic    Osteoarthritis    Primary osteoarthritis of right knee    Smoker    Tobacco use    Umbilical hernia    Vitamin D deficiency    Past Surgical History:  Procedure Laterality Date   AMPUTATION TOE Left 09/06/2022   Procedure: AMPUTATION TOE;  Surgeon: Gwyneth Revels, DPM;  Location: [facility] ORS;  Service: Podiatry;  Laterality: Left;   APPENDECTOMY     BACK SURGERY     injection   ESOPHAGOGASTRODUODENOSCOPY N/A 03/15/2015   Procedure: ESOPHAGOGASTRODUODENOSCOPY (EGD);  Surgeon: Wallace Cullens, MD;  Location: [facility] ENDOSCOPY;  Service: Gastroenterology;  Laterality: N/A;   ESOPHAGOGASTRODUODENOSCOPY (EGD) WITH PROPOFOL N/A 08/23/2015   Procedure: ESOPHAGOGASTRODUODENOSCOPY (EGD) WITH PROPOFOL;  Surgeon:  Wallace Cullens, MD;  Location: [facility] ENDOSCOPY;  Service: Gastroenterology;  Laterality: N/A;   Barrx Procedure   ESOPHAGOGASTRODUODENOSCOPY (EGD) WITH PROPOFOL N/A 01/24/2016   Procedure: ESOPHAGOGASTRODUODENOSCOPY (EGD) WITH PROPOFOL;  Surgeon: Wallace Cullens, MD;  Location[facility] ENDOSCOPY;  Service: Gastroenterology;  Laterality: N/A;   ESOPHAGOGASTRODUODENOSCOPY (EGD) WITH PROPOFOL N/A 12/03/2022   Procedure: ESOPHAGOGASTRODUODENOSCOPY (EGD) WITH PROPOFOL;  Surgeon: Wyline Mood, MD;  Location: [facility] ENDOSCOPY;  Service: Gastroenterology;  Laterality: N/A;   HERNIA REPAIR     JOINT REPLACEMENT Left    Partial Knee Replacement   KNEE ARTHROSCOPY     PAROTIDECTOMY Left 06/30/2017   Procedure: PAROTIDECTOMY;  Surgeon: Linus Salmons, MD;  Location: [facility] ORS;  Service: ENT;  Laterality: Left;   reattachment of finger Left    little finger   salvia gland tumors removed     SUPERFICIAL PERONEAL NERVE RELEASE     TOTAL KNEE ARTHROPLASTY Right 05/19/2023   Procedure: TOTAL KNEE ARTHROPLASTY;  Surgeon: Christena Flake, MD;  Location: [facility] ORS;  Service: Orthopedics;  Laterality: Right;   Social History:  reports that he has been smoking cigarettes. He has a 88.5 pack-year smoking history. He has never used smokeless tobacco. He reports current alcohol use. He reports current drug use. Drug: Marijuana.  Allergies  Allergen Reactions   Yellow Jacket Venom [Bee Venom] Anaphylaxis and Shortness Of Breath   Codeine Itching   Metaxalone Swelling   Venlafaxine Other (See Comments)    Loss of  appetite, jittery   Zyban [Bupropion] Other (See Comments)    hallucinations   Fluoxetine Anxiety    anger   Family History  Problem Relation Age of Onset   Diabetes Mother    Hypertension Mother    Hypertension Father    Family history: Family history reviewed and not pertinent  Prior to Admission medications   Medication Sig Start Date End Date Taking? Authorizing Provider  albuterol (PROVENTIL HFA;VENTOLIN HFA)  108 (90 Base) MCG/ACT inhaler Inhale into the lungs every 6 (six) hours as needed for wheezing or shortness of breath.    [provider]  Albuterol Sulfate 2.5 MG/0.5ML NEBU Inhale 1 each into the lungs every 6 (six) hours as needed (SOB).    [provider]  apixaban (ELIQUIS) 2.5 MG TABS tablet Take 1 tablet (2.5 mg total) by mouth 2 (two) times daily. 05/20/23   Poggi, Excell Seltzer, MD  DULoxetine (CYMBALTA) 60 MG capsule Take 60 mg by mouth daily. 11/07/22 11/02/23  [provider]  fluticasone (FLONASE) 50 MCG/ACT nasal spray Place 2 sprays into both nostrils 2 (two) times daily.  01/02/15   [provider]  LYRICA 150 MG capsule Take 150 mg by mouth 3 (three) times daily.  01/02/15   [provider]  nortriptyline (PAMELOR) 10 MG capsule Take 20 mg by mouth at bedtime. 04/23/22   [provider]  Omega-3-Acid Eth Est, Dietary, 1 g CAPS Take 1 g by mouth 2 (two) times daily.    [provider]  omeprazole (PRILOSEC OTC) 20 MG tablet Take 2 tablets (40 mg total) by mouth in the morning and at bedtime. 12/04/22 12/04/23  Wouk, Wilfred Curtis, MD  oxyCODONE (ROXICODONE) 5 MG immediate release tablet Take 1-2 tablets (5-10 mg total) by mouth every 4 (four) hours as needed for moderate pain or severe pain. 05/19/23   Poggi, Excell Seltzer, MD  potassium chloride SA (KLOR-CON M) 20 MEQ tablet Take 20 mEq by mouth 2 (two) times daily.    [provider]  rOPINIRole (REQUIP) 0.5 MG tablet Take 0.5 mg by mouth 4 (four) times daily as needed.    [provider]  rosuvastatin (CRESTOR) 10 MG tablet Take 10 mg by mouth at bedtime.    [provider]  telmisartan (MICARDIS) 80 MG tablet Take 40 mg by mouth daily. 06/03/21   [provider]  TRELEGY ELLIPTA 100-62.5-25 MCG/ACT AEPB Inhale 1 puff into the lungs daily.    [provider]  zolpidem (AMBIEN) 10 MG tablet Take 0.5 tablets (5 mg total) by mouth at bedtime. PATIENT HAS  SUPPLIES 01/12/18   Jomarie Longs, MD   Physical Exam: Vitals:   05/28/23 1530 05/28/23 1600 05/28/23 1630 05/28/23 1835  BP: (!) 127/100 (!) 121/101 (!) 139/91   Pulse: 96 98 78   Resp: (!) 33 20 19   Temp:    98.5 F (36.9 C)  TempSrc:      SpO2: 100% 99% 99%   Weight:      Height:       Constitutional: appears older than chronological age, frail, NAD, weak, acutely ill Eyes: PERRL, lids and conjunctivae normal ENMT: Mucous membranes are dry. Posterior pharynx clear of any exudate or lesions. Age-appropriate dentition. Hearing appropriate Neck: normal, supple, no masses, no thyromegaly Respiratory: clear to auscultation bilaterally, no wheezing, no crackles. Normal respiratory effort. No accessory muscle use.  Cardiovascular: Regular rate and rhythm, no murmurs / rubs / gallops. No extremity edema. 2+ pedal pulses.  No carotid bruits.  Abdomen: no tenderness, no masses palpated, no hepatosplenomegaly. Bowel sounds positive.  Musculoskeletal: no clubbing / cyanosis. No joint deformity upper and lower extremities. Good ROM, no contractures, no atrophy. Normal muscle tone.  Skin: no rashes, lesions, ulcers. No induration Neurologic: Sensation intact. Strength 5/5 in all 4.  Psychiatric: Normal judgment and insight. Alert and oriented x 3. Depressed mood.   EKG: independently reviewed, showing sinus tachycardia 112, QTc 444  Chest x-ray on Admission: I personally reviewed and I agree with radiologist reading as below.  DG Chest 2 View  Result Date: 05/28/2023 CLINICAL DATA:  Shortness of breath EXAM: CHEST - 2 VIEW COMPARISON:  X-ray 12/03/2022 FINDINGS: The heart size and mediastinal contours are within normal limits. Both lungs are clear. No consolidation, pneumothorax or effusion. No edema. The visualized skeletal structures are unremarkable. Degenerative changes are seen along the spine. IMPRESSION: No acute cardiopulmonary disease Electronically Signed   By: Karen Kays M.D.   On:  05/28/2023 14:57    Labs on Admission: I have personally reviewed following labs  CBC: Recent Labs  Lab 05/28/23 1351  WBC 18.6*  HGB 16.9  HCT 50.1  MCV 92.6  PLT 454*   Basic Metabolic Panel: Recent Labs  Lab 05/28/23 1351  NA 134*  K 3.5  CL 104  CO2 20*  GLUCOSE 128*  BUN 23  CREATININE 0.96  CALCIUM 9.6   GFR: Estimated Creatinine Clearance: 73.6 mL/min (by C-G formula based on SCr of 0.96 mg/dL).  Liver Function Tests: Recent Labs  Lab 05/28/23 1351  AST 29  ALT 22  ALKPHOS 97  BILITOT 1.8*  PROT 8.3*  ALBUMIN 4.3   Recent Labs  Lab 05/28/23 1351  LIPASE 44   Coagulation Profile: Recent Labs  Lab 05/28/23 1351  INR 1.0   Urine analysis:    Component Value Date/Time   COLORURINE YELLOW (A) 05/08/2023 1345   APPEARANCEUR CLEAR (A) 05/08/2023 1345   APPEARANCEUR Clear 07/22/2014 1144   LABSPEC 1.018 05/08/2023 1345   LABSPEC 1.005 07/22/2014 1144   PHURINE 6.0 05/08/2023 1345   GLUCOSEU NEGATIVE 05/08/2023 1345   GLUCOSEU Negative 07/22/2014 1144   HGBUR NEGATIVE 05/08/2023 1345   BILIRUBINUR NEGATIVE 05/08/2023 1345   BILIRUBINUR Negative 07/22/2014 1144   KETONESUR NEGATIVE 05/08/2023 1345   PROTEINUR NEGATIVE 05/08/2023 1345   NITRITE NEGATIVE 05/08/2023 1345   LEUKOCYTESUR NEGATIVE 05/08/2023 1345   LEUKOCYTESUR Negative 07/22/2014 1144   CRITICAL CARE Performed by: Dr. Sedalia Muta  Total critical care time: 32 minutes  Critical care time was exclusive of separately billable procedures and treating other patients.  Critical care was necessary to treat or prevent imminent or life-threatening deterioration.  Critical care was time spent personally by me on the following activities: development of treatment plan with patient and/or surrogate as well as nursing, discussions with consultants, evaluation of patient's response to treatment, examination of patient, obtaining history from patient or surrogate, ordering and performing treatments  and interventions, ordering and review of laboratory studies, ordering and review of radiographic studies, pulse oximetry and re-evaluation of patient's condition.  This document was prepared using Dragon Voice Recognition software and may include unintentional dictation errors.  Dr. Sedalia Muta [facility]  If 7PM-7AM, please contact overnight-coverage provider If 7AM-7PM, please contact day attending provider [email or web address]  05/28/2023, 8:16 PM

## 2023-05-28 NOTE — ED Notes (Signed)
Critical Result: Lactic Acid 2.6  Wells, MD made aware

## 2023-05-28 NOTE — Hospital Course (Addendum)
Mr. Anthony Bender is a 67 year old male with history of depression, anxiety, on anticoagulation with Eliquis, restless leg syndrome, hyperlipidemia, hypertension, insomnia, who presents emergency department for chief concerns of chills for 3 days and vomiting.  Vitals in the ED showed temperature of 98.5, respiration rate of 22, heart rate 95, blood pressure 121/101, SpO2 99% on room air.  Serum Na is 134, potassium 3.5, chloride 104, bicarb 20, BUN of 23, serum creatinine of 0.96, EGFR greater than 60, nonfasting blood glucose 128, WBC 18.6, hemoglobin 16.9, platelets of 454.  Lactic acid was initially 2.6 and on repeat was 2.2.  High sensitive troponin was 86 and on repeat was 90.  COVID/influenza A/influenza B/RSV PCR were negative  ED treatment: Vancomycin, cefepime, fentanyl 50 mcg IV one-time dose, lactated ringer 1 L bolus.

## 2023-05-28 NOTE — Consult Note (Signed)
ORTHOPAEDIC CONSULTATION  REQUESTING PHYSICIAN: Phineas Semen, MD  Chief Complaint:   Fevers chills and emesis status post total knee 9 days ago  History of Present Illness: Anthony Bender is a 67 y.o. male who presented to the emergency room with chills for 3 days and episodes of emesis and loss of taste this morning.  Orthopedics consulted due to some right knee pain status post total knee arthroplasty 9 days ago without a source of potential infection.  Patient reports he has been progressing with physical therapy and recently got to 0 to 93 degrees with physical therapy.  He does report he had a little bit of a hyperextension event on Sunday but has been able to ambulate since without any issues.  Denies any significant increase in swelling or any erythema or increased warmth to the knee.  Staples are still in place and he has not had any drainage from the knee.  He denies any chest pain or shortness of breath to me at this time.  Past Medical History:  Diagnosis Date   Anxiety    Aortic ectasia (HCC)    Asthma    Barrett's esophagus    BPH (benign prostatic hyperplasia)    COPD (chronic obstructive pulmonary disease) (HCC)    DDD (degenerative disc disease), lumbar    GERD (gastroesophageal reflux disease)    Headache    Hemorrhoids    Hyperlipidemia    Hyperlipidemia    Hypertension    Insomnia    Mild episode of recurrent major depressive disorder (HCC)    Neuropathy    Neuropathy, peripheral, idiopathic    Osteoarthritis    Primary osteoarthritis of right knee    Smoker    Tobacco use    Umbilical hernia    Vitamin D deficiency    Past Surgical History:  Procedure Laterality Date   AMPUTATION TOE Left 09/06/2022   Procedure: AMPUTATION TOE;  Surgeon: Gwyneth Revels, DPM;  Location: [facility] ORS;  Service: Podiatry;  Laterality: Left;   APPENDECTOMY     BACK SURGERY     injection    ESOPHAGOGASTRODUODENOSCOPY N/A 03/15/2015   Procedure: ESOPHAGOGASTRODUODENOSCOPY (EGD);  Surgeon: Wallace Cullens, MD;  Location: [facility] ENDOSCOPY;  Service: Gastroenterology;  Laterality: N/A;   ESOPHAGOGASTRODUODENOSCOPY (EGD) WITH PROPOFOL N/A 08/23/2015   Procedure: ESOPHAGOGASTRODUODENOSCOPY (EGD) WITH PROPOFOL;  Surgeon: Wallace Cullens, MD;  Location: [facility] ENDOSCOPY;  Service: Gastroenterology;  Laterality: N/A;   Barrx Procedure   ESOPHAGOGASTRODUODENOSCOPY (EGD) WITH PROPOFOL N/A 01/24/2016   Procedure: ESOPHAGOGASTRODUODENOSCOPY (EGD) WITH PROPOFOL;  Surgeon: Wallace Cullens, MD;  Location: [facility] ENDOSCOPY;  Service: Gastroenterology;  Laterality: N/A;   ESOPHAGOGASTRODUODENOSCOPY (EGD) WITH PROPOFOL N/A 12/03/2022   Procedure: ESOPHAGOGASTRODUODENOSCOPY (EGD) WITH PROPOFOL;  Surgeon: Wyline Mood, MD;  Location: [facility] ENDOSCOPY;  Service: Gastroenterology;  Laterality: N/A;   HERNIA REPAIR     JOINT REPLACEMENT Left    Partial Knee Replacement   KNEE ARTHROSCOPY     PAROTIDECTOMY Left 06/30/2017   Procedure: PAROTIDECTOMY;  Surgeon: Linus Salmons, MD;  Location: [facility] ORS;  Service: ENT;  Laterality: Left;   reattachment of finger Left    little finger   salvia gland tumors removed     SUPERFICIAL PERONEAL NERVE RELEASE     TOTAL KNEE ARTHROPLASTY Right 05/19/2023   Procedure: TOTAL KNEE ARTHROPLASTY;  Surgeon: Christena Flake, MD;  Location: [facility] ORS;  Service: Orthopedics;  Laterality: Right;   Social History   Socioeconomic History   Marital status: Married    Spouse  name: Elonda Husky   Number of children: 3   Years of education: Not on file   Highest education level: Some college, no degree  Occupational History    Comment: full time  Tobacco Use   Smoking status: Every Day    Current packs/day: 1.50    Average packs/day: 1.5 packs/day for 59.0 years (88.5 ttl pk-yrs)    Types: Cigarettes   Smokeless tobacco: Never  Vaping Use   Vaping status: Never Used  Substance and Sexual Activity   Alcohol  use: Yes   Drug use: Yes    Types: Marijuana    Comment: CBD   Sexual activity: Not Currently  Other Topics Concern   Not on file  Social History Narrative   Married and lives at home with wife.    Social Determinants of Health   Financial Resource Strain: Medium Risk (11/28/2022)   Received from [facility], [facility]   Overall Financial Resource Strain (CARDIA)    Difficulty of Paying Living Expenses: Somewhat hard  Food Insecurity: No Food Insecurity (05/19/2023)   Hunger Vital Sign    Worried About Running Out of Food in the Last Year: Never true    Ran Out of Food in the Last Year: Never true  Transportation Needs: No Transportation Needs (05/19/2023)   PRAPARE - Administrator, Civil Service (Medical): No    Lack of Transportation (Non-Medical): No  Physical Activity: Inactive (01/12/2018)   Exercise Vital Sign    Days of Exercise per Week: 0 days    Minutes of Exercise per Session: 0 min  Stress: Stress Concern Present (01/12/2018)   [employer] of Occupational Health - Occupational Stress Questionnaire    Feeling of Stress : Rather much  Social Connections: Moderately Isolated (01/12/2018)   Social Connection and Isolation Panel [NHANES]    Frequency of Communication with Friends and Family: Twice a week    Frequency of Social Gatherings with Friends and Family: Never    Attends Religious Services: Never    Diplomatic Services operational officer: No    Attends Engineer, structural: Never    Marital Status: Married   Family History  Problem Relation Age of Onset   Diabetes Mother    Hypertension Mother    Hypertension Father    Allergies  Allergen Reactions   Yellow Jacket Venom [Bee Venom] Anaphylaxis and Shortness Of Breath   Codeine Itching   Metaxalone Swelling   Venlafaxine Other (See Comments)    Loss of appetite, jittery   Zyban [Bupropion] Other (See Comments)    hallucinations   Fluoxetine  Anxiety    anger   Prior to Admission medications   Medication Sig Start Date End Date Taking? Authorizing Provider  albuterol (PROVENTIL HFA;VENTOLIN HFA) 108 (90 Base) MCG/ACT inhaler Inhale into the lungs every 6 (six) hours as needed for wheezing or shortness of breath.    [provider]  Albuterol Sulfate 2.5 MG/0.5ML NEBU Inhale 1 each into the lungs every 6 (six) hours as needed (SOB).    [provider]  apixaban (ELIQUIS) 2.5 MG TABS tablet Take 1 tablet (2.5 mg total) by mouth 2 (two) times daily. 05/20/23   Poggi, Excell Seltzer, MD  DULoxetine (CYMBALTA) 60 MG capsule Take 60 mg by mouth daily. 11/07/22 11/02/23  [provider]  fluticasone (FLONASE) 50 MCG/ACT nasal spray Place 2 sprays into both nostrils 2 (two) times daily.  01/02/15   [provider]  LYRICA 150 MG capsule Take 150 mg by mouth 3 (three) times daily.  01/02/15   [provider]  nortriptyline (PAMELOR) 10 MG capsule Take 20 mg by mouth at bedtime. 04/23/22   [provider]  Omega-3-Acid Eth Est, Dietary, 1 g CAPS Take 1 g by mouth 2 (two) times daily.    [provider]  omeprazole (PRILOSEC OTC) 20 MG tablet Take 2 tablets (40 mg total) by mouth in the morning and at bedtime. 12/04/22 12/04/23  Wouk, Wilfred Curtis, MD  oxyCODONE (ROXICODONE) 5 MG immediate release tablet Take 1-2 tablets (5-10 mg total) by mouth every 4 (four) hours as needed for moderate pain or severe pain. 05/19/23   Poggi, Excell Seltzer, MD  potassium chloride SA (KLOR-CON M) 20 MEQ tablet Take 20 mEq by mouth 2 (two) times daily.    [provider]  rOPINIRole (REQUIP) 0.5 MG tablet Take 0.5 mg by mouth 4 (four) times daily as needed.    [provider]  rosuvastatin (CRESTOR) 10 MG tablet Take 10 mg by mouth at bedtime.    [provider]  telmisartan (MICARDIS) 80 MG tablet Take 40 mg by mouth daily. 06/03/21   [provider]  TRELEGY ELLIPTA 100-62.5-25 MCG/ACT AEPB  Inhale 1 puff into the lungs daily.    [provider]  zolpidem (AMBIEN) 10 MG tablet Take 0.5 tablets (5 mg total) by mouth at bedtime. PATIENT HAS SUPPLIES 01/12/18   Jomarie Longs, MD   DG Chest 2 View  Result Date: 05/28/2023 CLINICAL DATA:  Shortness of breath EXAM: CHEST - 2 VIEW COMPARISON:  X-ray 12/03/2022 FINDINGS: The heart size and mediastinal contours are within normal limits. Both lungs are clear. No consolidation, pneumothorax or effusion. No edema. The visualized skeletal structures are unremarkable. Degenerative changes are seen along the spine. IMPRESSION: No acute cardiopulmonary disease Electronically Signed   By: Karen Kays M.D.   On: 05/28/2023 14:57    Positive ROS: All other systems have been reviewed and were otherwise negative with the exception of those mentioned in the HPI and as above.  Physical Exam: General:  Alert, no acute distress Psychiatric:  Patient is competent for consent with normal mood and affect   Cardiovascular:  No pedal edema Respiratory:  No wheezing, non-labored breathing GI:  Abdomen is soft and non-tender Skin:  No lesions in the area of chief complaint Neurologic:  Sensation intact distally Lymphatic:  No axillary or cervical lymphadenopathy  Orthopedic Exam:  Right lower extremity Healing incision over the anterior knee with staples in place no drainage or surrounding erythema there is significant ecchymotic changes around the thigh medial and lateral aspects of the knee but very little around the incision.  Minimal effusion normal for postoperative course on exam no tense effusion.  Able to range the knee 0 to 75 degrees with minimal discomfort to the patient.  Neurovascular intact with a stable knee on exam Able to dorsiflex and plantarflex the foot dorsalis pedis pulse intact   Assessment: Knee pain status post right total knee with systemic symptoms concerning for infection  Plan: I reviewed the clinical findings with  the patient and his wife as well as with Dr. Joice Lofts.  His knee exam is relatively benign for a postop day 9 knee with minimal warmth, erythema, or effusion which would be a more concerning picture for infection.  Given minimal symptoms at the knee I do have low clinical suspicion that the knee is a source of infection at  this time.  However if another source is not found we would consider arthrocentesis if he is not improving tomorrow.  Discussed this with the patient and emergency room provider who agree with the above plan.  There is planned admission to the medical team given laboratory abnormalities with the elevated white count and lactate.  Given the admission we will keep an eye on the patient and reexamine him tomorrow.   Reinaldo Berber MD  Beeper #:  [phone]  05/28/2023 5:58 PM

## 2023-05-29 ENCOUNTER — Encounter: Payer: Self-pay | Admitting: Internal Medicine

## 2023-05-29 DIAGNOSIS — R6520 Severe sepsis without septic shock: Secondary | ICD-10-CM | POA: Diagnosis not present

## 2023-05-29 DIAGNOSIS — A419 Sepsis, unspecified organism: Secondary | ICD-10-CM | POA: Diagnosis not present

## 2023-05-29 LAB — HEMOGLOBIN A1C
Hgb A1c MFr Bld: 6.4 % — ABNORMAL HIGH (ref 4.8–5.6)
Mean Plasma Glucose: 136.98 mg/dL

## 2023-05-29 LAB — MAGNESIUM: Magnesium: 2.5 mg/dL — ABNORMAL HIGH (ref 1.7–2.4)

## 2023-05-29 LAB — HEPATIC FUNCTION PANEL
ALT: 18 U/L (ref 0–44)
AST: 20 U/L (ref 15–41)
Albumin: 3.5 g/dL (ref 3.5–5.0)
Alkaline Phosphatase: 69 U/L (ref 38–126)
Bilirubin, Direct: 0.2 mg/dL (ref 0.0–0.2)
Indirect Bilirubin: 1.1 mg/dL — ABNORMAL HIGH (ref 0.3–0.9)
Total Bilirubin: 1.3 mg/dL — ABNORMAL HIGH (ref 0.3–1.2)
Total Protein: 6.5 g/dL (ref 6.5–8.1)

## 2023-05-29 LAB — BASIC METABOLIC PANEL
Anion gap: 11 (ref 5–15)
BUN: 19 mg/dL (ref 8–23)
CO2: 23 mmol/L (ref 22–32)
Calcium: 9.2 mg/dL (ref 8.9–10.3)
Chloride: 107 mmol/L (ref 98–111)
Creatinine, Ser: 0.74 mg/dL (ref 0.61–1.24)
GFR, Estimated: >60 mL/min (ref >60)
Glucose, Bld: 127 mg/dL — ABNORMAL HIGH (ref 70–99)
Potassium: 3.8 mmol/L (ref 3.5–5.1)
Sodium: 135 mmol/L (ref 135–145)

## 2023-05-29 LAB — CBC
HCT: 40 % (ref 39.0–52.0)
Hemoglobin: 13.6 g/dL (ref 13.0–17.0)
MCH: 31.9 pg (ref 26.0–34.0)
MCHC: 34 g/dL (ref 30.0–36.0)
MCV: 93.9 fL (ref 80.0–100.0)
Platelets: 315 10*3/uL (ref 150–400)
RBC: 4.26 MIL/uL (ref 4.22–5.81)
RDW: 15.1 % (ref 11.5–15.5)
WBC: 13.9 10*3/uL — ABNORMAL HIGH (ref 4.0–10.5)
nRBC: 0 % (ref 0.0–0.2)

## 2023-05-29 LAB — MRSA NEXT GEN BY PCR, NASAL: MRSA by PCR Next Gen: NOT DETECTED

## 2023-05-29 LAB — SYNOVIAL CELL COUNT + DIFF, W/ CRYSTALS
Crystals, Fluid: NONE SEEN
Eosinophils-Synovial: 0 %
Lymphocytes-Synovial Fld: 7 %
Monocyte-Macrophage-Synovial Fluid: 4 %
Neutrophil, Synovial: 89 %
WBC, Synovial: 11613 /mm3 — ABNORMAL HIGH (ref 0–200)

## 2023-05-29 MED ORDER — ALBUTEROL SULFATE (2.5 MG/3ML) 0.083% IN NEBU: 3.0000 mL | INHALATION_SOLUTION | Freq: Four times a day (QID) | RESPIRATORY_TRACT | Status: DC | PRN

## 2023-05-29 MED ORDER — PANTOPRAZOLE SODIUM 40 MG PO TBEC
40.0000 mg | DELAYED_RELEASE_TABLET | Freq: Two times a day (BID) | ORAL | Status: DC
  Administered 2023-05-29 – 2023-06-04 (×13): 40 mg via ORAL
  Filled 2023-05-29 (×13): qty 1

## 2023-05-29 MED ORDER — ALUM & MAG HYDROXIDE-SIMETH 200-200-20 MG/5ML PO SUSP
30.0000 mL | ORAL | Status: DC | PRN
  Administered 2023-05-29: 30 mL via ORAL
  Filled 2023-05-29: qty 30

## 2023-05-29 MED ORDER — MORPHINE SULFATE (PF) 2 MG/ML IV SOLN
2.0000 mg | INTRAVENOUS | Status: DC | PRN
  Administered 2023-05-29 – 2023-06-01 (×7): 2 mg via INTRAVENOUS
  Filled 2023-05-29 (×7): qty 1

## 2023-05-29 MED ORDER — FLUTICASONE PROPIONATE 50 MCG/ACT NA SUSP: 2.0000 | Freq: Two times a day (BID) | NASAL | Status: DC | PRN

## 2023-05-29 MED ORDER — NORTRIPTYLINE HCL 10 MG PO CAPS
20.0000 mg | ORAL_CAPSULE | Freq: Every day | ORAL | Status: DC
  Administered 2023-05-29 – 2023-06-03 (×6): 20 mg via ORAL
  Filled 2023-05-29 (×6): qty 2

## 2023-05-29 MED ORDER — OXYCODONE HCL 5 MG PO TABS
5.0000 mg | ORAL_TABLET | ORAL | Status: DC | PRN
  Administered 2023-05-29 – 2023-05-30 (×3): 10 mg via ORAL
  Administered 2023-05-30: 5 mg via ORAL
  Administered 2023-05-30 – 2023-06-04 (×8): 10 mg via ORAL
  Filled 2023-05-29 (×4): qty 2
  Filled 2023-05-29: qty 1
  Filled 2023-05-29 (×7): qty 2
  Filled 2023-05-29: qty 1
  Filled 2023-05-29: qty 2

## 2023-05-29 MED ORDER — ZOLPIDEM TARTRATE 5 MG PO TABS
5.0000 mg | ORAL_TABLET | Freq: Every day | ORAL | Status: DC
  Administered 2023-05-29 – 2023-06-03 (×6): 5 mg via ORAL
  Filled 2023-05-29 (×6): qty 1

## 2023-05-29 MED ORDER — PREGABALIN 75 MG PO CAPS
150.0000 mg | ORAL_CAPSULE | Freq: Three times a day (TID) | ORAL | Status: DC
  Administered 2023-05-29 – 2023-06-04 (×20): 150 mg via ORAL
  Filled 2023-05-29 (×20): qty 2

## 2023-05-29 MED ORDER — UMECLIDINIUM BROMIDE 62.5 MCG/ACT IN AEPB
1.0000 | INHALATION_SPRAY | Freq: Every day | RESPIRATORY_TRACT | Status: DC
  Administered 2023-05-29 – 2023-06-04 (×6): 1 via RESPIRATORY_TRACT
  Filled 2023-05-29 (×2): qty 7

## 2023-05-29 MED ORDER — FLUTICASONE FUROATE-VILANTEROL 100-25 MCG/ACT IN AEPB
1.0000 | INHALATION_SPRAY | Freq: Every day | RESPIRATORY_TRACT | Status: DC
  Administered 2023-05-29 – 2023-06-04 (×6): 1 via RESPIRATORY_TRACT
  Filled 2023-05-29 (×3): qty 28

## 2023-05-29 MED ORDER — OXYCODONE HCL 5 MG PO TABS
5.0000 mg | ORAL_TABLET | Freq: Once | ORAL | Status: AC
  Administered 2023-05-29: 5 mg via ORAL
  Filled 2023-05-29: qty 1

## 2023-05-29 NOTE — Plan of Care (Signed)

## 2023-05-29 NOTE — Consult Note (Signed)
ORTHOPAEDIC CONSULTATION  REQUESTING PHYSICIAN: Tresa Moore, MD  Chief Complaint:   Right knee pain and swelling.  History of Present Illness: Anthony Bender is a 67 y.o. male with multiple medical problems including COPD, asthma, hyperlipidemia, hypertension, and gastroesophageal reflux disease who is now 10 days status post a right total knee arthroplasty.  The patient developed chills and sweats yesterday morning, lost his sense of taste, and overall felt extremely poorly.  He messaged the office and was advised to go to the emergency room where he subsequently has been admitted for sepsis.  The patient denies any fevers while at home, and has been afebrile since arrival to the emergency room.  In the emergency room, his white count was noted to be elevated at 18.6.  This has improved to 13.9 this morning.  His lactic acid was 2.6 upon arrival and has improved to 1.3 this morning.  The patient notes that he was doing quite well at home following his surgery.  Walking out to his mailbox and going up and down his stairs.  He had been receiving physical therapy at home and felt that his recovery overall was going well.  He did have an incident at home several days ago when his knee somehow buckled on him.  Since then, he had increased swelling discomfort in his knee.  Past Medical History:  Diagnosis Date   Anxiety    Aortic ectasia (HCC)    Asthma    Barrett's esophagus    BPH (benign prostatic hyperplasia)    COPD (chronic obstructive pulmonary disease) (HCC)    DDD (degenerative disc disease), lumbar    GERD (gastroesophageal reflux disease)    Headache    Hemorrhoids    Hyperlipidemia    Hyperlipidemia    Hypertension    Insomnia    Mild episode of recurrent major depressive disorder (HCC)    Neuropathy    Neuropathy, peripheral, idiopathic    Osteoarthritis    Primary osteoarthritis of right knee    Smoker     Tobacco use    Umbilical hernia    Vitamin D deficiency    Past Surgical History:  Procedure Laterality Date   AMPUTATION TOE Left 09/06/2022   Procedure: AMPUTATION TOE;  Surgeon: Gwyneth Revels, DPM;  Location: [facility] ORS;  Service: Podiatry;  Laterality: Left;   APPENDECTOMY     BACK SURGERY     injection   ESOPHAGOGASTRODUODENOSCOPY N/A 03/15/2015   Procedure: ESOPHAGOGASTRODUODENOSCOPY (EGD);  Surgeon: Wallace Cullens, MD;  Location: [facility] ENDOSCOPY;  Service: Gastroenterology;  Laterality: N/A;   ESOPHAGOGASTRODUODENOSCOPY (EGD) WITH PROPOFOL N/A 08/23/2015   Procedure: ESOPHAGOGASTRODUODENOSCOPY (EGD) WITH PROPOFOL;  Surgeon: Wallace Cullens, MD;  Location: [facility] ENDOSCOPY;  Service: Gastroenterology;  Laterality: N/A;   Barrx Procedure   ESOPHAGOGASTRODUODENOSCOPY (EGD) WITH PROPOFOL N/A 01/24/2016   Procedure: ESOPHAGOGASTRODUODENOSCOPY (EGD) WITH PROPOFOL;  Surgeon: Wallace Cullens, MD;  Location: [facility] ENDOSCOPY;  Service: Gastroenterology;  Laterality: N/A;   ESOPHAGOGASTRODUODENOSCOPY (EGD) WITH PROPOFOL N/A 12/03/2022   Procedure: ESOPHAGOGASTRODUODENOSCOPY (EGD) WITH PROPOFOL;  Surgeon: Wyline Mood, MD;  Location: [facility] ENDOSCOPY;  Service: Gastroenterology;  Laterality: N/A;   HERNIA REPAIR     JOINT REPLACEMENT Left    Partial Knee Replacement   KNEE ARTHROSCOPY     PAROTIDECTOMY Left 06/30/2017   Procedure: PAROTIDECTOMY;  Surgeon: Linus Salmons, MD;  Location: [facility] ORS;  Service: ENT;  Laterality: Left;   reattachment of finger Left    little finger   salvia gland tumors removed  SUPERFICIAL PERONEAL NERVE RELEASE     TOTAL KNEE ARTHROPLASTY Right 05/19/2023   Procedure: TOTAL KNEE ARTHROPLASTY;  Surgeon: Christena Flake, MD;  Location: [facility] ORS;  Service: Orthopedics;  Laterality: Right;   Social History   Socioeconomic History   Marital status: Married    Spouse name: kathie   Number of children: 3   Years of education: Not on file   Highest education level: Some college, no  degree  Occupational History    Comment: full time  Tobacco Use   Smoking status: Every Day    Current packs/day: 1.50    Average packs/day: 1.5 packs/day for 59.0 years (88.5 ttl pk-yrs)    Types: Cigarettes   Smokeless tobacco: Never  Vaping Use   Vaping status: Never Used  Substance and Sexual Activity   Alcohol use: Yes   Drug use: Yes    Types: Marijuana    Comment: CBD   Sexual activity: Not Currently  Other Topics Concern   Not on file  Social History Narrative   Married and lives at home with wife.    Social Determinants of Health   Financial Resource Strain: Medium Risk (11/28/2022)   Received from [facility]   Overall Financial Resource Strain (CARDIA)    Difficulty of Paying Living Expenses: Somewhat hard  Food Insecurity: No Food Insecurity (05/28/2023)   Hunger Vital Sign    Worried About Running Out of Food in the Last Year: Never true    Ran Out of Food in the Last Year: Never true  Transportation Needs: No Transportation Needs (05/28/2023)   PRAPARE - Administrator, Civil Service (Medical): No    Lack of Transportation (Non-Medical): No  Physical Activity: Inactive (01/12/2018)   Exercise Vital Sign    Days of Exercise per Week: 0 days    Minutes of Exercise per Session: 0 min  Stress: Stress Concern Present (01/12/2018)   [employer] of Occupational Health - Occupational Stress Questionnaire    Feeling of Stress : Rather much  Social Connections: Moderately Isolated (01/12/2018)   Social Connection and Isolation Panel [NHANES]    Frequency of Communication with Friends and Family: Twice a week    Frequency of Social Gatherings with Friends and Family: Never    Attends Religious Services: Never    Diplomatic Services operational officer: No    Attends Engineer, structural: Never    Marital Status: Married   Family History  Problem Relation Age of Onset   Diabetes Mother     Hypertension Mother    Hypertension Father    Allergies  Allergen Reactions   Yellow Jacket Venom [Bee Venom] Anaphylaxis and Shortness Of Breath   Codeine Itching   Metaxalone Swelling   Venlafaxine Other (See Comments)    Loss of appetite, jittery   Zyban [Bupropion] Other (See Comments)    hallucinations   Fluoxetine Anxiety    anger   Prior to Admission medications   Medication Sig Start Date End Date Taking? Authorizing Provider  albuterol (PROVENTIL HFA;VENTOLIN HFA) 108 (90 Base) MCG/ACT inhaler Inhale into the lungs every 6 (six) hours as needed for wheezing or shortness of breath.   Yes [provider]  Albuterol Sulfate 2.5 MG/0.5ML NEBU Inhale 1 each into the lungs every 6 (six) hours as needed (SOB).   Yes [provider]  apixaban (ELIQUIS) 2.5 MG TABS tablet Take 1 tablet (2.5 mg total) by mouth 2 (  two) times daily. 05/20/23  Yes Darletta Noblett, Excell Seltzer, MD  DULoxetine (CYMBALTA) 60 MG capsule Take 60 mg by mouth daily. 11/07/22 11/02/23 Yes [provider]  fluticasone (FLONASE) 50 MCG/ACT nasal spray Place 2 sprays into both nostrils 2 (two) times daily.  01/02/15  Yes [provider]  LYRICA 150 MG capsule Take 150 mg by mouth 3 (three) times daily.  01/02/15  Yes [provider]  nortriptyline (PAMELOR) 10 MG capsule Take 20 mg by mouth at bedtime. 04/23/22  Yes [provider]  omeprazole (PRILOSEC OTC) 20 MG tablet Take 2 tablets (40 mg total) by mouth in the morning and at bedtime. 12/04/22 12/04/23 Yes Wouk, Wilfred Curtis, MD  oxyCODONE (ROXICODONE) 5 MG immediate release tablet Take 1-2 tablets (5-10 mg total) by mouth every 4 (four) hours as needed for moderate pain or severe pain. 05/19/23  Yes Catie Chiao, Excell Seltzer, MD  rOPINIRole (REQUIP) 0.5 MG tablet Take 0.5 mg by mouth 4 (four) times daily as needed.   Yes [provider]  rosuvastatin (CRESTOR) 10 MG tablet Take 10 mg by mouth at bedtime.   Yes [provider]   telmisartan (MICARDIS) 80 MG tablet Take 40 mg by mouth daily. 06/03/21  Yes [provider]  TRELEGY ELLIPTA 100-62.5-25 MCG/ACT AEPB Inhale 1 puff into the lungs daily.   Yes [provider]  zolpidem (AMBIEN) 10 MG tablet Take 0.5 tablets (5 mg total) by mouth at bedtime. PATIENT HAS SUPPLIES 01/12/18  Yes Eappen, Levin Bacon, MD  Omega-3-Acid Eth Est, Dietary, 1 g CAPS Take 1 g by mouth 2 (two) times daily. Patient not taking: Reported on 05/28/2023    [provider]  potassium chloride SA (KLOR-CON M) 20 MEQ tablet Take 20 mEq by mouth 2 (two) times daily. Patient not taking: Reported on 05/28/2023    [provider]   DG Chest 2 View  Result Date: 05/28/2023 CLINICAL DATA:  Shortness of breath EXAM: CHEST - 2 VIEW COMPARISON:  X-ray 12/03/2022 FINDINGS: The heart size and mediastinal contours are within normal limits. Both lungs are clear. No consolidation, pneumothorax or effusion. No edema. The visualized skeletal structures are unremarkable. Degenerative changes are seen along the spine. IMPRESSION: No acute cardiopulmonary disease Electronically Signed   By: Karen Kays M.D.   On: 05/28/2023 14:57    Positive ROS: All other systems have been reviewed and were otherwise negative with the exception of those mentioned in the HPI and as above.  Physical Exam: General:  Alert, no acute distress Psychiatric:  Patient is competent for consent with normal mood and affect   Cardiovascular:  No pedal edema Respiratory:  No wheezing, non-labored breathing GI:  Abdomen is soft and non-tender Skin:  No lesions in the area of chief complaint Neurologic:  Sensation intact distally Lymphatic:  No axillary or cervical lymphadenopathy  Orthopedic Exam:  Orthopedic examination is limited to the right knee and lower extremity.  The surgical incision appears to be well-healed and without evidence for infection.  There is no surrounding erythema or drainage from the  incision site.  However, there is moderate swelling as well as a 1+ effusion.  He has mild/moderate tenderness to palpation over the medial and lateral aspects of the knee.  Actively, he is able to perform a straight leg raise.  He is able to extend his knee to within 10 degrees of full extension and flex his knee to 70 degrees.  Passively, the knee can be extended to within 5  degrees of full extension.  His patella tracks well and is without crepitus.  The knee is stable to varus and valgus stressing.  He is neurovascularly intact to the right lower extremity and foot.  Assessment: Increased pain and swelling of right knee status post right total knee arthroplasty.  Plan: The treatment options have been discussed with the patient and his wife who is at the bedside.  Given his knee pain and the lack of a clear alternative source of his apparent infection, I feel it is necessary to perform a knee aspiration.  After obtaining verbal consent, the right knee is aspirated sterilely of 20 cc of bloody fluid.  This fluid has been sent for cell count differential, crystals, culture and sensitivity, and Gram stain.  Meanwhile, while awaiting the results of this aspiration, he is to continue to receive the IV antibiotics that were started last evening, and receive medication for pain as deemed appropriate from medical standpoint.  He may be mobilized with physical therapy as tolerated.  Thank you for asking me to participate in the care of this most pleasant young unfortunate man.  I will be happy to follow him with you.   Maryagnes Amos, MD  Beeper #:  [phone]  05/29/2023 8:17 AM

## 2023-05-29 NOTE — Progress Notes (Signed)
PROGRESS NOTE    Anthony Bender  ZHY:[id number] DOB: [birth year] DOA: 05/28/2023 PCP: Kennis Carina, MD    Brief Narrative:  67 year old male with history of depression, anxiety, on anticoagulation with Eliquis, restless leg syndrome, hyperlipidemia, hypertension, insomnia, who presents emergency department for chief concerns of chills for 3 days and vomiting.  Subsequently developed chills and sweats the day prior to presentation.  Also reports loss of sense of taste.  He is 10 days status post right total knee arthroplasty.  Elevated lactic acid and white blood cell count on admission.  8/23: Status post bedside arthrocentesis with orthopedic surgery on 8/23   Assessment & Plan:   Principal Problem:   Severe sepsis with acute organ dysfunction [facility]) Active Problems:   Status post total knee replacement using cement, right   Elevated troponin   Benign prostatic hyperplasia   Chronic insomnia   COPD (chronic obstructive pulmonary disease) (HCC)   Hyperlipidemia, unspecified   Leukocytosis   Depression   Severe sepsis with acute organ dysfunction (HCC) Potential source right knee.  Status post arthrocentesis bedside with orthopedic surgery.  White blood cell count is decreasing.  No fevers over interval.  Patient continues to feel poorly. Plan: Continue broad-spectrum IV antibiotics for today Follow cultures from blood and wound Tailor antibiotics based on culture data Monitor vitals and fever curve   Elevated troponin Suspect secondary to severe sepsis Low suspicion for ACS DC telemetry   Status post total knee replacement using cement, right On 05/19/2023 Orthopedics following   Depression Duloxetine 60 mg daily resumed   Leukocytosis Suspect secondary to sepsis Downtrending   Hyperlipidemia, unspecified Rosuvastatin 10 mg nightly resumed   COPD (chronic obstructive pulmonary disease) (HCC) Does not appear to be in acute exacerbation DuoNebs every 6 hours as  needed for shortness of breath and wheezing, 3 days ordered   Chronic insomnia Zolpidem 5 mg nightly as needed for sleep  DVT prophylaxis: Eliquis Code Status: Full Family Communication: Spouse at bedside 8/23 Disposition Plan: Status is: Inpatient Remains inpatient appropriate because: Sepsis on IV antibiotics   Level of care: Progressive  Consultants:  Orthopedics  Procedures:  Right knee arthrocentesis  Antimicrobials: Vancomycin Cefepime Flagyl   Subjective: Seen and appear resting in bed.  Appears mildly uncomfortable.  Reports no interval improvement in symptoms.  Objective: Vitals:   05/28/23 2257 05/29/23 0402 05/29/23 0739 05/29/23 1110  BP:  129/79 130/81 136/80  Pulse:  70 67 89  Resp:  18 18 18   Temp: 97.8 F (36.6 C) 98.5 F (36.9 C) 98.2 F (36.8 C) 97.8 F (36.6 C)  TempSrc: Oral     SpO2: 99% 97% 99% 99%  Weight:  74.5 kg    Height:        Intake/Output Summary (Last 24 hours) at 05/29/2023 1353 Last data filed at 05/29/2023 1100 Gross per 24 hour  Intake 1712.22 ml  Output 1075 ml  Net 637.22 ml   Filed Weights   05/28/23 1346 05/29/23 0402  Weight: 83.9 kg 74.5 kg    Examination:  General exam: Appears calm and comfortable  Respiratory system: Clear to auscultation. Respiratory effort normal. Cardiovascular system: S1-S2, RRR, no murmurs, no pedal edema Gastrointestinal system: Soft,/ND, normal bowel sounds Central nervous system: Alert and oriented. No focal neurological deficits. Extremities: Right knee swollen, tender to touch, decreased ROM Skin: No rashes, lesions or ulcers Psychiatry: Judgement and insight appear normal. Mood & affect appropriate.     Data Reviewed: I have personally reviewed  following labs and imaging studies  CBC: Recent Labs  Lab 05/28/23 1351 05/29/23 0538  WBC 18.6* 13.9*  HGB 16.9 13.6  HCT 50.1 40.0  MCV 92.6 93.9  PLT 454* 315   Basic Metabolic Panel: Recent Labs  Lab 05/28/23 1351  05/29/23 0538  NA 134* 135  K 3.5 3.8  CL 104 107  CO2 20* 23  GLUCOSE 128* 127*  BUN 23 19  CREATININE 0.96 0.74  CALCIUM 9.6 9.2  MG  --  2.5*   GFR: Estimated Creatinine Clearance: 88.4 mL/min (by C-G formula based on SCr of 0.74 mg/dL). Liver Function Tests: Recent Labs  Lab 05/28/23 1351 05/29/23 0538  AST 29 20  ALT 22 18  ALKPHOS 97 69  BILITOT 1.8* 1.3*  PROT 8.3* 6.5  ALBUMIN 4.3 3.5   Recent Labs  Lab 05/28/23 1351  LIPASE 44   No results for input(s): "AMMONIA" in the last 168 hours. Coagulation Profile: Recent Labs  Lab 05/28/23 1351  INR 1.0   Cardiac Enzymes: No results for input(s): "CKTOTAL", "CKMB", "CKMBINDEX", "TROPONINI" in the last 168 hours. BNP (last 3 results) No results for input(s): "PROBNP" in the last 8760 hours. HbA1C: Recent Labs    05/29/23 0538  HGBA1C 6.4*   CBG: No results for input(s): "GLUCAP" in the last 168 hours. Lipid Profile: No results for input(s): "CHOL", "HDL", "LDLCALC", "TRIG", "CHOLHDL", "LDLDIRECT" in the last 72 hours. Thyroid Function Tests: No results for input(s): "TSH", "T4TOTAL", "FREET4", "T3FREE", "THYROIDAB" in the last 72 hours. Anemia Panel: No results for input(s): "VITAMINB12", "FOLATE", "FERRITIN", "TIBC", "IRON", "RETICCTPCT" in the last 72 hours. Sepsis Labs: Recent Labs  Lab 05/28/23 1351 05/28/23 1500 05/28/23 2308  LATICACIDVEN 2.6* 2.2* 1.3    Recent Results (from the past 240 hour(s))  SARS Coronavirus 2 by RT PCR (hospital order, performed in [facility] hospital lab) *cepheid single result test* Anterior Nasal Swab     Status: None   Collection Time: 05/28/23  1:51 PM   Specimen: Anterior Nasal Swab  Result Value Ref Range Status   SARS Coronavirus 2 by RT PCR NEGATIVE NEGATIVE Final    Comment: (NOTE) SARS-CoV-2 target nucleic acids are NOT DETECTED.  The SARS-CoV-2 RNA is generally detectable in upper and lower respiratory specimens during the acute phase of infection.  The lowest concentration of SARS-CoV-2 viral copies this assay can detect is 250 copies / mL. A negative result does not preclude SARS-CoV-2 infection and should not be used as the sole basis for treatment or other patient management decisions.  A negative result may occur with improper specimen collection / handling, submission of specimen other than nasopharyngeal swab, presence of viral mutation(s) within the areas targeted by this assay, and inadequate number of viral copies (<250 copies / mL). A negative result must be combined with clinical observations, patient history, and epidemiological information.  Fact Sheet for Patients:   RoadLapTop.co.za  Fact Sheet for Healthcare Providers: http://kim-miller.com/  This test is not yet approved or  cleared by the [place] FDA and has been authorized for detection and/or diagnosis of SARS-CoV-2 by FDA under an Emergency Use Authorization (EUA).  This EUA will remain in effect (meaning this test can be used) for the duration of the COVID-19 declaration under Section 564(b)(1) of the Act, 21 U.S.C. section 360bbb-3(b)(1), unless the authorization is terminated or revoked sooner.  Performed at [facility], [address] [place], [place] [address]   Resp panel by RT-PCR (RSV, Flu A&B, Covid)  Anterior Nasal Swab     Status: None   Collection Time: 05/28/23  5:18 PM   Specimen: Anterior Nasal Swab  Result Value Ref Range Status   SARS Coronavirus 2 by RT PCR NEGATIVE NEGATIVE Final    Comment: (NOTE) SARS-CoV-2 target nucleic acids are NOT DETECTED.  The SARS-CoV-2 RNA is generally detectable in upper respiratory specimens during the acute phase of infection. The lowest concentration of SARS-CoV-2 viral copies this assay can detect is 138 copies/mL. A negative result does not preclude SARS-Cov-2 infection and should not be used as the sole basis for treatment or other  patient management decisions. A negative result may occur with  improper specimen collection/handling, submission of specimen other than nasopharyngeal swab, presence of viral mutation(s) within the areas targeted by this assay, and inadequate number of viral copies(<138 copies/mL). A negative result must be combined with clinical observations, patient history, and epidemiological information. The expected result is Negative.  Fact Sheet for Patients:  BloggerCourse.com  Fact Sheet for Healthcare Providers:  SeriousBroker.it  This test is no t yet approved or cleared by the [place] FDA and  has been authorized for detection and/or diagnosis of SARS-CoV-2 by FDA under an Emergency Use Authorization (EUA). This EUA will remain  in effect (meaning this test can be used) for the duration of the COVID-19 declaration under Section 564(b)(1) of the Act, 21 U.S.C.section 360bbb-3(b)(1), unless the authorization is terminated  or revoked sooner.       Influenza A by PCR NEGATIVE NEGATIVE Final   Influenza B by PCR NEGATIVE NEGATIVE Final    Comment: (NOTE) The Xpert Xpress SARS-CoV-2/FLU/RSV plus assay is intended as an aid in the diagnosis of influenza from Nasopharyngeal swab specimens and should not be used as a sole basis for treatment. Nasal washings and aspirates are unacceptable for Xpert Xpress SARS-CoV-2/FLU/RSV testing.  Fact Sheet for Patients: BloggerCourse.com  Fact Sheet for Healthcare Providers: SeriousBroker.it  This test is not yet approved or cleared by the [place] FDA and has been authorized for detection and/or diagnosis of SARS-CoV-2 by FDA under an Emergency Use Authorization (EUA). This EUA will remain in effect (meaning this test can be used) for the duration of the COVID-19 declaration under Section 564(b)(1) of the Act, 21 U.S.C. section  360bbb-3(b)(1), unless the authorization is terminated or revoked.     Resp Syncytial Virus by PCR NEGATIVE NEGATIVE Final    Comment: (NOTE) Fact Sheet for Patients: BloggerCourse.com  Fact Sheet for Healthcare Providers: SeriousBroker.it  This test is not yet approved or cleared by the [place] FDA and has been authorized for detection and/or diagnosis of SARS-CoV-2 by FDA under an Emergency Use Authorization (EUA). This EUA will remain in effect (meaning this test can be used) for the duration of the COVID-19 declaration under Section 564(b)(1) of the Act, 21 U.S.C. section 360bbb-3(b)(1), unless the authorization is terminated or revoked.  Performed at [facility], [address] [place], [place] [address]   Culture, blood (Routine X 2) w Reflex to ID Panel     Status: None (Preliminary result)   Collection Time: 05/28/23  7:45 PM   Specimen: BLOOD  Result Value Ref Range Status   Specimen Description BLOOD  Final   Special Requests   Final    BOTTLES DRAWN AEROBIC AND ANAEROBIC Blood Culture results may not be optimal due to an inadequate volume of blood received in culture bottles   Culture   Final    NO GROWTH <  12 HOURS Performed at [facility], [address] [place], [place] [address]    Report Status PENDING  Incomplete  Culture, blood (Routine X 2) w Reflex to ID Panel     Status: None (Preliminary result)   Collection Time: 05/28/23  7:48 PM   Specimen: BLOOD  Result Value Ref Range Status   Specimen Description BLOOD  Final   Special Requests   Final    BOTTLES DRAWN AEROBIC AND ANAEROBIC Blood Culture results may not be optimal due to an inadequate volume of blood received in culture bottles   Culture   Final    NO GROWTH < 12 HOURS Performed at [facility], [address] [place], [place] [address]    Report Status PENDING  Incomplete  MRSA Next Gen by PCR, Nasal      Status: None   Collection Time: 05/28/23 11:17 PM   Specimen: Nasal Mucosa; Nasal Swab  Result Value Ref Range Status   MRSA by PCR Next Gen NOT DETECTED NOT DETECTED Final    Comment: (NOTE) The GeneXpert MRSA Assay (FDA approved for NASAL specimens only), is one component of a comprehensive MRSA colonization surveillance program. It is not intended to diagnose MRSA infection nor to guide or monitor treatment for MRSA infections. Test performance is not FDA approved in patients less than 59 years old. Performed at [facility], [address], [place], [place] [address]          Radiology Studies: DG Chest 2 View  Result Date: 05/28/2023 CLINICAL DATA:  Shortness of breath EXAM: CHEST - 2 VIEW COMPARISON:  X-ray 12/03/2022 FINDINGS: The heart size and mediastinal contours are within normal limits. Both lungs are clear. No consolidation, pneumothorax or effusion. No edema. The visualized skeletal structures are unremarkable. Degenerative changes are seen along the spine. IMPRESSION: No acute cardiopulmonary disease Electronically Signed   By: Karen Kays M.D.   On: 05/28/2023 14:57        Scheduled Meds:  apixaban  2.5 mg Oral BID   DULoxetine  60 mg Oral Daily   fluticasone furoate-vilanterol  1 puff Inhalation Daily   And   umeclidinium bromide  1 puff Inhalation Daily   nortriptyline  20 mg Oral QHS   pantoprazole  40 mg Oral BID AC   pregabalin  150 mg Oral TID   rosuvastatin  10 mg Oral QHS   zolpidem  5 mg Oral QHS   Continuous Infusions:  sodium chloride 75 mL/hr at 05/29/23 1028   ceFEPime (MAXIPIME) IV 2 g (05/29/23 0749)   metronidazole 500 mg (05/29/23 1023)   vancomycin 1,000 mg (05/29/23 0865)     LOS: 1 day     Tresa Moore, MD [facility]   If 7PM-7AM, please contact night-coverage  05/29/2023, 1:53 PM

## 2023-05-30 DIAGNOSIS — R6520 Severe sepsis without septic shock: Secondary | ICD-10-CM | POA: Diagnosis not present

## 2023-05-30 DIAGNOSIS — A419 Sepsis, unspecified organism: Secondary | ICD-10-CM | POA: Diagnosis not present

## 2023-05-30 NOTE — Progress Notes (Signed)
Mobility Specialist - Progress Note   05/30/23 0914  Mobility  Activity Ambulated independently in hallway;Stood at bedside;Dangled on edge of bed  Level of Assistance Independent  Assistive Device Front wheel walker  Distance Ambulated (ft) 160 ft  RLE Weight Bearing WBAT  Activity Response Tolerated well  Mobility Referral Yes  $Mobility charge 1 Mobility  Mobility Specialist Start Time (ACUTE ONLY) 0901  Mobility Specialist Stop Time (ACUTE ONLY) 0914  Mobility Specialist Time Calculation (min) (ACUTE ONLY) 13 min   Pt supine in bed on RA upon arrival. Pt completes bed mobility, STS, and ambulates 1 lap around NS indep. Pt returns to bed with needs in reach.   Terrilyn Saver  Mobility Specialist  05/30/23 9:15 AM

## 2023-05-30 NOTE — Progress Notes (Signed)
PROGRESS NOTE    Anthony CRISTINA  Bender:[id number] DOB: [birth year] DOA: 05/28/2023 PCP: Kennis Carina, MD    Brief Narrative:  67 year old male with history of depression, anxiety, on anticoagulation with Eliquis, restless leg syndrome, hyperlipidemia, hypertension, insomnia, who presents emergency department for chief concerns of chills for 3 days and vomiting.  Subsequently developed chills and sweats the day prior to presentation.  Also reports loss of sense of taste.  He is 10 days status post right total knee arthroplasty.  Elevated lactic acid and white blood cell count on admission.  8/23: Status post bedside arthrocentesis with orthopedic surgery on 8/23 8/24: Abundant inflammatory cells noted on arthrocentesis culture data.  No organisms isolated at this time.  Patient feeling better   Assessment & Plan:   Principal Problem:   Severe sepsis with acute organ dysfunction (HCC) Active Problems:   Status post total knee replacement using cement, right   Elevated troponin   Benign prostatic hyperplasia   Chronic insomnia   COPD (chronic obstructive pulmonary disease) (HCC)   Hyperlipidemia, unspecified   Leukocytosis   Depression   Severe sepsis with acute organ dysfunction (HCC) Potential source right knee.  Status post arthrocentesis bedside with orthopedic surgery.  White blood cell count is decreasing.  No fevers over interval.  Patient starting to feel better Plan: Broad-spectrum IV antibiotics Follow cultures from blood and wound, no growth to date Tailor antibiotics based on culture data Monitor vitals and fever curve Orthopedic follow-up   Elevated troponin Suspect secondary to severe sepsis Low suspicion for ACS DC telemetry EKG as needed chest pain   Status post total knee replacement using cement, right On 05/19/2023 Orthopedics following   Depression Duloxetine 60 mg daily resumed   Leukocytosis Suspect secondary to sepsis Downtrending    Hyperlipidemia, unspecified Rosuvastatin 10 mg nightly resumed   COPD (chronic obstructive pulmonary disease) (HCC) Does not appear to be in acute exacerbation DuoNebs every 6 hours as needed for shortness of breath and wheezing, 3 days ordered   Chronic insomnia Zolpidem 5 mg nightly as needed for sleep  DVT prophylaxis: Eliquis Code Status: Full Family Communication: Spouse at bedside 8/23 Disposition Plan: Status is: Inpatient Remains inpatient appropriate because: Sepsis on IV antibiotics   Level of care: Progressive  Consultants:  Orthopedics  Procedures:  Right knee arthrocentesis  Antimicrobials: Vancomycin Cefepime Flagyl   Subjective: Seen and examined.  Resting in bed.  Appears much more comfortable than yesterday.  Objective: Vitals:   05/29/23 2018 05/29/23 2334 05/30/23 0456 05/30/23 0752  BP: 128/86 128/86 120/72 (!) 141/87  Pulse: 72 67 70 71  Resp: 18 18 18 12   Temp: 98.7 F (37.1 C) 98 F (36.7 C) 98.1 F (36.7 C) 98.1 F (36.7 C)  TempSrc:      SpO2: 96% 99% 100% 100%  Weight:      Height:        Intake/Output Summary (Last 24 hours) at 05/30/2023 1315 Last data filed at 05/30/2023 0755 Gross per 24 hour  Intake 3194.84 ml  Output 3450 ml  Net -255.16 ml   Filed Weights   05/28/23 1346 05/29/23 0402  Weight: 83.9 kg 74.5 kg    Examination:  General exam: NAD Respiratory system: Lungs clear.  Normal work of breathing.  Room air Cardiovascular system: S1-S2, RRR, no murmurs, no pedal edema Gastrointestinal system: Soft,/ND, normal bowel sounds Central nervous system: Alert and oriented. No focal neurological deficits. Extremities: Right knee swollen, tender to touch, decreased ROM Skin:  No rashes, lesions or ulcers Psychiatry: Judgement and insight appear normal. Mood & affect appropriate.     Data Reviewed: I have personally reviewed following labs and imaging studies  CBC: Recent Labs  Lab 05/28/23 1351 05/29/23 0538   WBC 18.6* 13.9*  HGB 16.9 13.6  HCT 50.1 40.0  MCV 92.6 93.9  PLT 454* 315   Basic Metabolic Panel: Recent Labs  Lab 05/28/23 1351 05/29/23 0538  NA 134* 135  K 3.5 3.8  CL 104 107  CO2 20* 23  GLUCOSE 128* 127*  BUN 23 19  CREATININE 0.96 0.74  CALCIUM 9.6 9.2  MG  --  2.5*   GFR: Estimated Creatinine Clearance: 88.4 mL/min (by C-G formula based on SCr of 0.74 mg/dL). Liver Function Tests: Recent Labs  Lab 05/28/23 1351 05/29/23 0538  AST 29 20  ALT 22 18  ALKPHOS 97 69  BILITOT 1.8* 1.3*  PROT 8.3* 6.5  ALBUMIN 4.3 3.5   Recent Labs  Lab 05/28/23 1351  LIPASE 44   No results for input(s): "AMMONIA" in the last 168 hours. Coagulation Profile: Recent Labs  Lab 05/28/23 1351  INR 1.0   Cardiac Enzymes: No results for input(s): "CKTOTAL", "CKMB", "CKMBINDEX", "TROPONINI" in the last 168 hours. BNP (last 3 results) No results for input(s): "PROBNP" in the last 8760 hours. HbA1C: Recent Labs    05/29/23 0538  HGBA1C 6.4*   CBG: No results for input(s): "GLUCAP" in the last 168 hours. Lipid Profile: No results for input(s): "CHOL", "HDL", "LDLCALC", "TRIG", "CHOLHDL", "LDLDIRECT" in the last 72 hours. Thyroid Function Tests: No results for input(s): "TSH", "T4TOTAL", "FREET4", "T3FREE", "THYROIDAB" in the last 72 hours. Anemia Panel: No results for input(s): "VITAMINB12", "FOLATE", "FERRITIN", "TIBC", "IRON", "RETICCTPCT" in the last 72 hours. Sepsis Labs: Recent Labs  Lab 05/28/23 1351 05/28/23 1500 05/28/23 2308  LATICACIDVEN 2.6* 2.2* 1.3    Recent Results (from the past 240 hour(s))  SARS Coronavirus 2 by RT PCR (hospital order, performed in [facility] hospital lab) *cepheid single result test* Anterior Nasal Swab     Status: None   Collection Time: 05/28/23  1:51 PM   Specimen: Anterior Nasal Swab  Result Value Ref Range Status   SARS Coronavirus 2 by RT PCR NEGATIVE NEGATIVE Final    Comment: (NOTE) SARS-CoV-2 target nucleic acids  are NOT DETECTED.  The SARS-CoV-2 RNA is generally detectable in upper and lower respiratory specimens during the acute phase of infection. The lowest concentration of SARS-CoV-2 viral copies this assay can detect is 250 copies / mL. A negative result does not preclude SARS-CoV-2 infection and should not be used as the sole basis for treatment or other patient management decisions.  A negative result may occur with improper specimen collection / handling, submission of specimen other than nasopharyngeal swab, presence of viral mutation(s) within the areas targeted by this assay, and inadequate number of viral copies (<250 copies / mL). A negative result must be combined with clinical observations, patient history, and epidemiological information.  Fact Sheet for Patients:   RoadLapTop.co.za  Fact Sheet for Healthcare Providers: http://kim-miller.com/  This test is not yet approved or  cleared by the [place] FDA and has been authorized for detection and/or diagnosis of SARS-CoV-2 by FDA under an Emergency Use Authorization (EUA).  This EUA will remain in effect (meaning this test can be used) for the duration of the COVID-19 declaration under Section 564(b)(1) of the Act, 21 U.S.C. section 360bbb-3(b)(1), unless the authorization is terminated or  revoked sooner.  Performed at [facility], [address] [place], [place] [address]   Resp panel by RT-PCR (RSV, Flu A&B, Covid) Anterior Nasal Swab     Status: None   Collection Time: 05/28/23  5:18 PM   Specimen: Anterior Nasal Swab  Result Value Ref Range Status   SARS Coronavirus 2 by RT PCR NEGATIVE NEGATIVE Final    Comment: (NOTE) SARS-CoV-2 target nucleic acids are NOT DETECTED.  The SARS-CoV-2 RNA is generally detectable in upper respiratory specimens during the acute phase of infection. The lowest concentration of SARS-CoV-2 viral copies this assay can detect  is 138 copies/mL. A negative result does not preclude SARS-Cov-2 infection and should not be used as the sole basis for treatment or other patient management decisions. A negative result may occur with  improper specimen collection/handling, submission of specimen other than nasopharyngeal swab, presence of viral mutation(s) within the areas targeted by this assay, and inadequate number of viral copies(<138 copies/mL). A negative result must be combined with clinical observations, patient history, and epidemiological information. The expected result is Negative.  Fact Sheet for Patients:  BloggerCourse.com  Fact Sheet for Healthcare Providers:  SeriousBroker.it  This test is no t yet approved or cleared by the [place] FDA and  has been authorized for detection and/or diagnosis of SARS-CoV-2 by FDA under an Emergency Use Authorization (EUA). This EUA will remain  in effect (meaning this test can be used) for the duration of the COVID-19 declaration under Section 564(b)(1) of the Act, 21 U.S.C.section 360bbb-3(b)(1), unless the authorization is terminated  or revoked sooner.       Influenza A by PCR NEGATIVE NEGATIVE Final   Influenza B by PCR NEGATIVE NEGATIVE Final    Comment: (NOTE) The Xpert Xpress SARS-CoV-2/FLU/RSV plus assay is intended as an aid in the diagnosis of influenza from Nasopharyngeal swab specimens and should not be used as a sole basis for treatment. Nasal washings and aspirates are unacceptable for Xpert Xpress SARS-CoV-2/FLU/RSV testing.  Fact Sheet for Patients: BloggerCourse.com  Fact Sheet for Healthcare Providers: SeriousBroker.it  This test is not yet approved or cleared by the [place] FDA and has been authorized for detection and/or diagnosis of SARS-CoV-2 by FDA under an Emergency Use Authorization (EUA). This EUA will remain in effect  (meaning this test can be used) for the duration of the COVID-19 declaration under Section 564(b)(1) of the Act, 21 U.S.C. section 360bbb-3(b)(1), unless the authorization is terminated or revoked.     Resp Syncytial Virus by PCR NEGATIVE NEGATIVE Final    Comment: (NOTE) Fact Sheet for Patients: BloggerCourse.com  Fact Sheet for Healthcare Providers: SeriousBroker.it  This test is not yet approved or cleared by the [place] FDA and has been authorized for detection and/or diagnosis of SARS-CoV-2 by FDA under an Emergency Use Authorization (EUA). This EUA will remain in effect (meaning this test can be used) for the duration of the COVID-19 declaration under Section 564(b)(1) of the Act, 21 U.S.C. section 360bbb-3(b)(1), unless the authorization is terminated or revoked.  Performed at [facility], [address] [place], [place] [address]   Culture, blood (Routine X 2) w Reflex to ID Panel     Status: None (Preliminary result)   Collection Time: 05/28/23  7:45 PM   Specimen: BLOOD  Result Value Ref Range Status   Specimen Description BLOOD  Final   Special Requests   Final    BOTTLES DRAWN AEROBIC AND ANAEROBIC Blood Culture results may not  be optimal due to an inadequate volume of blood received in culture bottles   Culture   Final    NO GROWTH 2 DAYS Performed at [facility], [address] [place], [place] [address]    Report Status PENDING  Incomplete  Culture, blood (Routine X 2) w Reflex to ID Panel     Status: None (Preliminary result)   Collection Time: 05/28/23  7:48 PM   Specimen: BLOOD  Result Value Ref Range Status   Specimen Description BLOOD  Final   Special Requests   Final    BOTTLES DRAWN AEROBIC AND ANAEROBIC Blood Culture results may not be optimal due to an inadequate volume of blood received in culture bottles   Culture   Final    NO GROWTH 2 DAYS Performed at [facility], [address], [place], [place] [address]    Report Status PENDING  Incomplete  MRSA Next Gen by PCR, Nasal     Status: None   Collection Time: 05/28/23 11:17 PM   Specimen: Nasal Mucosa; Nasal Swab  Result Value Ref Range Status   MRSA by PCR Next Gen NOT DETECTED NOT DETECTED Final    Comment: (NOTE) The GeneXpert MRSA Assay (FDA approved for NASAL specimens only), is one component of a comprehensive MRSA colonization surveillance program. It is not intended to diagnose MRSA infection nor to guide or monitor treatment for MRSA infections. Test performance is not FDA approved in patients less than 32 years old. Performed at [facility], [address] [place], [place] [address]   Body fluid culture w Gram Stain     Status: None (Preliminary result)   Collection Time: 05/29/23  8:08 AM   Specimen: Synovium; Body Fluid  Result Value Ref Range Status   Specimen Description   Final    SYNOVIAL Performed at [facility], [address] [place], [place] [address]    Special Requests   Final    RIGHT KNEE ASPIRATE Performed at [facility], [address] [place], [place] [address]    Gram Stain   Final    ABUNDANT WBC PRESENT,BOTH PMN AND MONONUCLEAR NO ORGANISMS SEEN    Culture   Final    NO GROWTH < 24 HOURS Performed at [facility], [address], [place], [place] [address]    Report Status PENDING  Incomplete         Radiology Studies: DG Chest 2 View  Result Date: 05/28/2023 CLINICAL DATA:  Shortness of breath EXAM: CHEST - 2 VIEW COMPARISON:  X-ray 12/03/2022 FINDINGS: The heart size and mediastinal contours are within normal limits. Both lungs are clear. No consolidation, pneumothorax or effusion. No edema. The visualized skeletal structures are unremarkable. Degenerative changes are seen along the spine. IMPRESSION: No acute cardiopulmonary disease Electronically Signed   By: Karen Kays M.D.   On: 05/28/2023  14:57        Scheduled Meds:  apixaban  2.5 mg Oral BID   DULoxetine  60 mg Oral Daily   fluticasone furoate-vilanterol  1 puff Inhalation Daily   And   umeclidinium bromide  1 puff Inhalation Daily   nortriptyline  20 mg Oral QHS   pantoprazole  40 mg Oral BID AC   pregabalin  150 mg Oral TID   rosuvastatin  10 mg Oral QHS   zolpidem  5 mg Oral QHS   Continuous Infusions:  sodium chloride 75 mL/hr at 05/30/23 0524   ceFEPime (MAXIPIME)  IV 2 g (05/30/23 0816)   metronidazole 500 mg (05/30/23 0819)   vancomycin 1,000 mg (05/30/23 0521)     LOS: 2 days     Tresa Moore, MD [facility]   If 7PM-7AM, please contact night-coverage  05/30/2023, 1:15 PM

## 2023-05-30 NOTE — Progress Notes (Addendum)
.  Transition of Care [facility]) - Inpatient Brief Assessment   Patient Details  Name: Anthony Bender MRN: [id number] Date of Birth: [birth year]  Transition of [facility]) CM/SW Contact:    Bing Quarry, RN Phone Number: 05/30/2023, 11:17 AM   Clinical Narrative: Brief Assessment completed.   Gabriel Cirri MSN RN CM  Transitions of Care Department [facility] [phone] Weekends Only     Transition of Care Asessment: Insurance and Status: Insurance coverage has been reviewed PCP: Yes Home environment has been reviewed: Was at home prior to this admission s/p knee surgery on 05/19/23. LIves with spouse at home.Spurlock,Kathie (Spouse)  [phone] (Mobile) Prior level of function:: At home s/p knee surgery with [facility] services. Per ED consult notes patient had been able to get "0 to 93 degrees with physical therapy" with HH PT. Prior/Current Home Services: Current home services ([facility]) Social Determinants of Health Reivew: SDOH reviewed no interventions necessary Readmission risk has been reviewed: No (Score not present yet in EMR.) Transition of care needs: transition of care needs identified, TOC will continue to follow (Likely need HH resumption on discharge, was active with [facility].)

## 2023-05-31 ENCOUNTER — Inpatient Hospital Stay: Payer: No Typology Code available for payment source

## 2023-05-31 DIAGNOSIS — A419 Sepsis, unspecified organism: Secondary | ICD-10-CM | POA: Diagnosis not present

## 2023-05-31 DIAGNOSIS — R6520 Severe sepsis without septic shock: Secondary | ICD-10-CM | POA: Diagnosis not present

## 2023-05-31 NOTE — Progress Notes (Signed)
Mobility Specialist - Progress Note   05/31/23 0949  Mobility  Activity Ambulated independently in hallway;Stood at bedside;Dangled on edge of bed  Level of Assistance Independent  Assistive Device Front wheel walker  Distance Ambulated (ft) 160 ft  RLE Weight Bearing WBAT  Activity Response Tolerated well  Mobility Referral Yes  $Mobility charge 1 Mobility  Mobility Specialist Start Time (ACUTE ONLY) 0911  Mobility Specialist Stop Time (ACUTE ONLY) [id number]  Mobility Specialist Time Calculation (min) (ACUTE ONLY) 10 min   Pt supine in bed on RA upon arrival. Pt STS and ambulates in hallway indep. Pt returns to EOB with needs in reach and RN present.   Terrilyn Saver  Mobility Specialist  05/31/23 9:50 AM

## 2023-05-31 NOTE — Progress Notes (Signed)
PROGRESS NOTE    Anthony Bender  ZOX:[id number] DOB: 1956/01/02 DOA: 05/28/2023 PCP: Kennis Carina, MD    Brief Narrative:  67 year old male with history of depression, anxiety, on anticoagulation with Eliquis, restless leg syndrome, hyperlipidemia, hypertension, insomnia, who presents emergency department for chief concerns of chills for 3 days and vomiting.  Subsequently developed chills and sweats the day prior to presentation.  Also reports loss of sense of taste.  He is 10 days status post right total knee arthroplasty.  Elevated lactic acid and white blood cell count on admission.  8/23: Status post bedside arthrocentesis with orthopedic surgery on 8/23 8/24: Abundant inflammatory cells noted on arthrocentesis culture data.  No organisms isolated at this time.  Patient feeling better   Assessment & Plan:   Principal Problem:   Severe sepsis with acute organ dysfunction (HCC) Active Problems:   Status post total knee replacement using cement, right   Elevated troponin   Benign prostatic hyperplasia   Chronic insomnia   COPD (chronic obstructive pulmonary disease) (HCC)   Hyperlipidemia, unspecified   Leukocytosis   Depression   Severe sepsis with acute organ dysfunction (HCC) Potential source right knee.  Status post arthrocentesis bedside with orthopedic surgery.  White blood cell count is decreasing.  No fevers over interval.  Patient starting to feel better.  Cultures remain with no growth although abundant PMNs Plan: Broad-spectrum IV antibiotics Follow cultures from blood and wound, no growth to date Tailor antibiotics based on culture data Monitor vitals and fever curve Orthopedic follow-up Empirically placed n.p.o. after midnight in case surgical intervention is warranted   Elevated troponin Suspect secondary to severe sepsis Low suspicion for ACS DC telemetry EKG as needed chest pain   Status post total knee replacement using cement, right On  05/19/2023 Orthopedics following   Depression Duloxetine 60 mg daily resumed   Leukocytosis Suspect secondary to sepsis Downtrending   Hyperlipidemia, unspecified Rosuvastatin 10 mg nightly resumed   COPD (chronic obstructive pulmonary disease) (HCC) Does not appear to be in acute exacerbation As needed bronchodilators   Chronic insomnia Zolpidem 5 mg nightly as needed for sleep  DVT prophylaxis: Eliquis Code Status: Full Family Communication: Spouse at bedside 8/23, 8/25 Disposition Plan: Status is: Inpatient Remains inpatient appropriate because: Sepsis on IV antibiotics   Level of care: Med-Surg  Consultants:  Orthopedics  Procedures:  Right knee arthrocentesis  Antimicrobials: Vancomycin Cefepime Flagyl   Subjective: Seen and examined.  No acute events.  Resting in bed comfortably.  Objective: Vitals:   05/31/23 0022 05/31/23 0320 05/31/23 0831 05/31/23 1117  BP: 123/81 129/88 (!) 142/77 (!) 135/90  Pulse: 70 78 74 86  Resp: 16 17 16 16   Temp: 97.7 F (36.5 C) 98.3 F (36.8 C) 98.8 F (37.1 C) 97.8 F (36.6 C)  TempSrc:      SpO2: 99% 98% 98% 97%  Weight:      Height:        Intake/Output Summary (Last 24 hours) at 05/31/2023 1152 Last data filed at 05/31/2023 0700 Gross per 24 hour  Intake 480 ml  Output 3650 ml  Net -3170 ml   Filed Weights   05/28/23 1346 05/29/23 0402  Weight: 83.9 kg 74.5 kg    Examination:  General exam: No acute distress Respiratory system: Lungs clear.  Normal work of breathing.  Room air Cardiovascular system: S1-S2, RRR, no murmurs, no pedal edema Gastrointestinal system: Soft,/ND, normal bowel sounds Central nervous system: Alert and oriented. No focal neurological deficits.  Extremities: Right knee swollen, tender to touch, decreased ROM Skin: No rashes, lesions or ulcers Psychiatry: Judgement and insight appear normal. Mood & affect appropriate.     Data Reviewed: I have personally reviewed following  labs and imaging studies  CBC: Recent Labs  Lab 05/28/23 1351 05/29/23 0538  WBC 18.6* 13.9*  HGB 16.9 13.6  HCT 50.1 40.0  MCV 92.6 93.9  PLT 454* 315   Basic Metabolic Panel: Recent Labs  Lab 05/28/23 1351 05/29/23 0538  NA 134* 135  K 3.5 3.8  CL 104 107  CO2 20* 23  GLUCOSE 128* 127*  BUN 23 19  CREATININE 0.96 0.74  CALCIUM 9.6 9.2  MG  --  2.5*   GFR: Estimated Creatinine Clearance: 88.4 mL/min (by C-G formula based on SCr of 0.74 mg/dL). Liver Function Tests: Recent Labs  Lab 05/28/23 1351 05/29/23 0538  AST 29 20  ALT 22 18  ALKPHOS 97 69  BILITOT 1.8* 1.3*  PROT 8.3* 6.5  ALBUMIN 4.3 3.5   Recent Labs  Lab 05/28/23 1351  LIPASE 44   No results for input(s): "AMMONIA" in the last 168 hours. Coagulation Profile: Recent Labs  Lab 05/28/23 1351  INR 1.0   Cardiac Enzymes: No results for input(s): "CKTOTAL", "CKMB", "CKMBINDEX", "TROPONINI" in the last 168 hours. BNP (last 3 results) No results for input(s): "PROBNP" in the last 8760 hours. HbA1C: Recent Labs    05/29/23 0538  HGBA1C 6.4*   CBG: No results for input(s): "GLUCAP" in the last 168 hours. Lipid Profile: No results for input(s): "CHOL", "HDL", "LDLCALC", "TRIG", "CHOLHDL", "LDLDIRECT" in the last 72 hours. Thyroid Function Tests: No results for input(s): "TSH", "T4TOTAL", "FREET4", "T3FREE", "THYROIDAB" in the last 72 hours. Anemia Panel: No results for input(s): "VITAMINB12", "FOLATE", "FERRITIN", "TIBC", "IRON", "RETICCTPCT" in the last 72 hours. Sepsis Labs: Recent Labs  Lab 05/28/23 1351 05/28/23 1500 05/28/23 2308  LATICACIDVEN 2.6* 2.2* 1.3    Recent Results (from the past 240 hour(s))  SARS Coronavirus 2 by RT PCR (hospital order, performed in [facility] hospital lab) *cepheid single result test* Anterior Nasal Swab     Status: None   Collection Time: 05/28/23  1:51 PM   Specimen: Anterior Nasal Swab  Result Value Ref Range Status   SARS Coronavirus 2 by RT  PCR NEGATIVE NEGATIVE Final    Comment: (NOTE) SARS-CoV-2 target nucleic acids are NOT DETECTED.  The SARS-CoV-2 RNA is generally detectable in upper and lower respiratory specimens during the acute phase of infection. The lowest concentration of SARS-CoV-2 viral copies this assay can detect is 250 copies / mL. A negative result does not preclude SARS-CoV-2 infection and should not be used as the sole basis for treatment or other patient management decisions.  A negative result may occur with improper specimen collection / handling, submission of specimen other than nasopharyngeal swab, presence of viral mutation(s) within the areas targeted by this assay, and inadequate number of viral copies (<250 copies / mL). A negative result must be combined with clinical observations, patient history, and epidemiological information.  Fact Sheet for Patients:   RoadLapTop.co.za  Fact Sheet for Healthcare Providers: http://kim-miller.com/  This test is not yet approved or  cleared by the [place] FDA and has been authorized for detection and/or diagnosis of SARS-CoV-2 by FDA under an Emergency Use Authorization (EUA).  This EUA will remain in effect (meaning this test can be used) for the duration of the COVID-19 declaration under Section 564(b)(1) of the Act,  21 U.S.C. section 360bbb-3(b)(1), unless the authorization is terminated or revoked sooner.  Performed at [facility], [address] [place], [place] [address]   Resp panel by RT-PCR (RSV, Flu A&B, Covid) Anterior Nasal Swab     Status: None   Collection Time: 05/28/23  5:18 PM   Specimen: Anterior Nasal Swab  Result Value Ref Range Status   SARS Coronavirus 2 by RT PCR NEGATIVE NEGATIVE Final    Comment: (NOTE) SARS-CoV-2 target nucleic acids are NOT DETECTED.  The SARS-CoV-2 RNA is generally detectable in upper respiratory specimens during the acute phase of  infection. The lowest concentration of SARS-CoV-2 viral copies this assay can detect is 138 copies/mL. A negative result does not preclude SARS-Cov-2 infection and should not be used as the sole basis for treatment or other patient management decisions. A negative result may occur with  improper specimen collection/handling, submission of specimen other than nasopharyngeal swab, presence of viral mutation(s) within the areas targeted by this assay, and inadequate number of viral copies(<138 copies/mL). A negative result must be combined with clinical observations, patient history, and epidemiological information. The expected result is Negative.  Fact Sheet for Patients:  BloggerCourse.com  Fact Sheet for Healthcare Providers:  SeriousBroker.it  This test is no t yet approved or cleared by the [place] FDA and  has been authorized for detection and/or diagnosis of SARS-CoV-2 by FDA under an Emergency Use Authorization (EUA). This EUA will remain  in effect (meaning this test can be used) for the duration of the COVID-19 declaration under Section 564(b)(1) of the Act, 21 U.S.C.section 360bbb-3(b)(1), unless the authorization is terminated  or revoked sooner.       Influenza A by PCR NEGATIVE NEGATIVE Final   Influenza B by PCR NEGATIVE NEGATIVE Final    Comment: (NOTE) The Xpert Xpress SARS-CoV-2/FLU/RSV plus assay is intended as an aid in the diagnosis of influenza from Nasopharyngeal swab specimens and should not be used as a sole basis for treatment. Nasal washings and aspirates are unacceptable for Xpert Xpress SARS-CoV-2/FLU/RSV testing.  Fact Sheet for Patients: BloggerCourse.com  Fact Sheet for Healthcare Providers: SeriousBroker.it  This test is not yet approved or cleared by the [place] FDA and has been authorized for detection and/or diagnosis of SARS-CoV-2  by FDA under an Emergency Use Authorization (EUA). This EUA will remain in effect (meaning this test can be used) for the duration of the COVID-19 declaration under Section 564(b)(1) of the Act, 21 U.S.C. section 360bbb-3(b)(1), unless the authorization is terminated or revoked.     Resp Syncytial Virus by PCR NEGATIVE NEGATIVE Final    Comment: (NOTE) Fact Sheet for Patients: BloggerCourse.com  Fact Sheet for Healthcare Providers: SeriousBroker.it  This test is not yet approved or cleared by the [place] FDA and has been authorized for detection and/or diagnosis of SARS-CoV-2 by FDA under an Emergency Use Authorization (EUA). This EUA will remain in effect (meaning this test can be used) for the duration of the COVID-19 declaration under Section 564(b)(1) of the Act, 21 U.S.C. section 360bbb-3(b)(1), unless the authorization is terminated or revoked.  Performed at [facility], [address] [place], [place] [address]   Culture, blood (Routine X 2) w Reflex to ID Panel     Status: None (Preliminary result)   Collection Time: 05/28/23  7:45 PM   Specimen: BLOOD  Result Value Ref Range Status   Specimen Description BLOOD  Final   Special Requests   Final  BOTTLES DRAWN AEROBIC AND ANAEROBIC Blood Culture results may not be optimal due to an inadequate volume of blood received in culture bottles   Culture   Final    NO GROWTH 3 DAYS Performed at [facility], [address] [place], [place] [address]    Report Status PENDING  Incomplete  Culture, blood (Routine X 2) w Reflex to ID Panel     Status: None (Preliminary result)   Collection Time: 05/28/23  7:48 PM   Specimen: BLOOD  Result Value Ref Range Status   Specimen Description BLOOD  Final   Special Requests   Final    BOTTLES DRAWN AEROBIC AND ANAEROBIC Blood Culture results may not be optimal due to an inadequate volume of blood received in  culture bottles   Culture   Final    NO GROWTH 3 DAYS Performed at [facility], [address] [place], [place] [address]    Report Status PENDING  Incomplete  MRSA Next Gen by PCR, Nasal     Status: None   Collection Time: 05/28/23 11:17 PM   Specimen: Nasal Mucosa; Nasal Swab  Result Value Ref Range Status   MRSA by PCR Next Gen NOT DETECTED NOT DETECTED Final    Comment: (NOTE) The GeneXpert MRSA Assay (FDA approved for NASAL specimens only), is one component of a comprehensive MRSA colonization surveillance program. It is not intended to diagnose MRSA infection nor to guide or monitor treatment for MRSA infections. Test performance is not FDA approved in patients less than 68 years old. Performed at [facility], [address] [place], [place] [address]   Body fluid culture w Gram Stain     Status: None (Preliminary result)   Collection Time: 05/29/23  8:08 AM   Specimen: Synovium; Body Fluid  Result Value Ref Range Status   Specimen Description   Final    SYNOVIAL Performed at [facility], [address] [place], [place] [address]    Special Requests   Final    RIGHT KNEE ASPIRATE Performed at [facility], [address] [place], [place] [address]    Gram Stain   Final    ABUNDANT WBC PRESENT,BOTH PMN AND MONONUCLEAR NO ORGANISMS SEEN    Culture   Final    NO GROWTH 2 DAYS Performed at [facility], [address], [place], [place] [address]    Report Status PENDING  Incomplete         Radiology Studies: No results found.      Scheduled Meds:  apixaban  2.5 mg Oral BID   DULoxetine  60 mg Oral Daily   fluticasone furoate-vilanterol  1 puff Inhalation Daily   And   umeclidinium bromide  1 puff Inhalation Daily   nortriptyline  20 mg Oral QHS   pantoprazole  40 mg Oral BID AC   pregabalin  150 mg Oral TID   rosuvastatin  10 mg Oral QHS   zolpidem  5 mg Oral QHS   Continuous Infusions:  ceFEPime  (MAXIPIME) IV 2 g (05/31/23 0827)   metronidazole 500 mg (05/31/23 0916)   vancomycin 1,000 mg (05/31/23 0554)     LOS: 3 days     Tresa Moore, MD [facility]   If 7PM-7AM, please contact night-coverage  05/31/2023, 11:52 AM

## 2023-05-31 NOTE — Progress Notes (Signed)
Mobility Specialist - Progress Note   05/31/23 1546  Mobility  Activity Ambulated independently in hallway;Stood at bedside;Dangled on edge of bed  Level of Assistance Independent  Assistive Device Front wheel walker  Distance Ambulated (ft) 240 ft  RLE Weight Bearing WBAT  Activity Response Tolerated well  Mobility Referral Yes  $Mobility charge 1 Mobility  Mobility Specialist Start Time (ACUTE ONLY) 0324  Mobility Specialist Stop Time (ACUTE ONLY) 0331  Mobility Specialist Time Calculation (min) (ACUTE ONLY) 7 min   Pt supine in bed on RA upon arrival. Pt STS and ambulates in hallway indep. Pt left with transport team with needs.  Terrilyn Saver  Mobility Specialist  05/31/23 3:48 PM

## 2023-05-31 NOTE — Plan of Care (Signed)
Pt is alert and oriented, having some pain from s/p right knee surgery. Pt getting up independently with FWW. Pt receiving IV antibiotics.  Problem: Fluid Volume: Goal: Hemodynamic stability will improve Outcome: Progressing   Problem: Clinical Measurements: Goal: Diagnostic test results will improve Outcome: Progressing Goal: Signs and symptoms of infection will decrease Outcome: Progressing   Problem: Respiratory: Goal: Ability to maintain adequate ventilation will improve Outcome: Progressing   Problem: Education: Goal: Knowledge of General Education information will improve Description: Including pain rating scale, medication(s)/side effects and non-pharmacologic comfort measures Outcome: Progressing   Problem: Health Behavior/Discharge Planning: Goal: Ability to manage health-related needs will improve Outcome: Progressing   Problem: Clinical Measurements: Goal: Ability to maintain clinical measurements within normal limits will improve Outcome: Progressing Goal: Will remain free from infection Outcome: Progressing Goal: Diagnostic test results will improve Outcome: Progressing Goal: Respiratory complications will improve Outcome: Progressing Goal: Cardiovascular complication will be avoided Outcome: Progressing   Problem: Activity: Goal: Risk for activity intolerance will decrease Outcome: Progressing   Problem: Nutrition: Goal: Adequate nutrition will be maintained Outcome: Progressing   Problem: Coping: Goal: Level of anxiety will decrease Outcome: Progressing   Problem: Elimination: Goal: Will not experience complications related to bowel motility Outcome: Progressing Goal: Will not experience complications related to urinary retention Outcome: Progressing   Problem: Pain Managment: Goal: General experience of comfort will improve Outcome: Progressing   Problem: Safety: Goal: Ability to remain free from injury will improve Outcome: Progressing    Problem: Skin Integrity: Goal: Risk for impaired skin integrity will decrease Outcome: Progressing

## 2023-06-01 ENCOUNTER — Other Ambulatory Visit: Payer: Self-pay

## 2023-06-01 ENCOUNTER — Encounter: Payer: Self-pay | Admitting: Internal Medicine

## 2023-06-01 ENCOUNTER — Inpatient Hospital Stay: Payer: No Typology Code available for payment source | Admitting: Anesthesiology

## 2023-06-01 ENCOUNTER — Encounter
Admission: EM | Disposition: A | Discharge status: Home Health | Payer: Self-pay | Source: Home / Self Care | Attending: Internal Medicine

## 2023-06-01 DIAGNOSIS — R6520 Severe sepsis without septic shock: Secondary | ICD-10-CM | POA: Diagnosis not present

## 2023-06-01 DIAGNOSIS — A419 Sepsis, unspecified organism: Secondary | ICD-10-CM | POA: Diagnosis not present

## 2023-06-01 HISTORY — PX: TOTAL KNEE REVISION: SHX996

## 2023-06-01 LAB — CBC WITH DIFFERENTIAL/PLATELET
Abs Immature Granulocytes: 0.07 10*3/uL (ref 0.00–0.07)
Basophils Absolute: 0.1 10*3/uL (ref 0.0–0.1)
Basophils Relative: 1 %
Eosinophils Absolute: 0.2 10*3/uL (ref 0.0–0.5)
Eosinophils Relative: 1 %
HCT: 41.2 % (ref 39.0–52.0)
Hemoglobin: 13.9 g/dL (ref 13.0–17.0)
Immature Granulocytes: 1 %
Lymphocytes Relative: 16 %
Lymphs Abs: 1.7 10*3/uL (ref 0.7–4.0)
MCH: 31.6 pg (ref 26.0–34.0)
MCHC: 33.7 g/dL (ref 30.0–36.0)
MCV: 93.6 fL (ref 80.0–100.0)
Monocytes Absolute: 0.8 10*3/uL (ref 0.1–1.0)
Monocytes Relative: 8 %
Neutro Abs: 7.8 10*3/uL — ABNORMAL HIGH (ref 1.7–7.7)
Neutrophils Relative %: 73 %
Platelets: 290 10*3/uL (ref 150–400)
RBC: 4.4 MIL/uL (ref 4.22–5.81)
RDW: 14.9 % (ref 11.5–15.5)
WBC: 10.6 10*3/uL — ABNORMAL HIGH (ref 4.0–10.5)
nRBC: 0 % (ref 0.0–0.2)

## 2023-06-01 LAB — BODY FLUID CULTURE W GRAM STAIN: Culture: NO GROWTH

## 2023-06-01 LAB — CREATININE, SERUM
Creatinine, Ser: 0.83 mg/dL (ref 0.61–1.24)
GFR, Estimated: >60 mL/min (ref >60)

## 2023-06-01 LAB — SARS CORONAVIRUS 2 BY RT PCR: SARS Coronavirus 2 by RT PCR: NEGATIVE

## 2023-06-01 SURGERY — TOTAL KNEE REVISION
Anesthesia: General | Site: Knee | Laterality: Right

## 2023-06-01 MED ORDER — VANCOMYCIN HCL 1000 MG IV SOLR
INTRAVENOUS | Status: AC
  Filled 2023-06-01: qty 20

## 2023-06-01 MED ORDER — KETOROLAC TROMETHAMINE 15 MG/ML IJ SOLN
INTRAMUSCULAR | Status: AC
  Filled 2023-06-01: qty 1

## 2023-06-01 MED ORDER — LACTATED RINGERS IV SOLN: INTRAVENOUS | Status: DC | PRN

## 2023-06-01 MED ORDER — ACETAMINOPHEN 500 MG PO TABS
1000.0000 mg | ORAL_TABLET | Freq: Four times a day (QID) | ORAL | Status: AC
  Administered 2023-06-01 – 2023-06-02 (×4): 1000 mg via ORAL
  Filled 2023-06-01 (×5): qty 2

## 2023-06-01 MED ORDER — SODIUM CHLORIDE 0.9 % IV SOLN
Status: DC | PRN
  Administered 2023-06-01: 3000 mL

## 2023-06-01 MED ORDER — OXYCODONE HCL 5 MG PO TABS
5.0000 mg | ORAL_TABLET | Freq: Once | ORAL | Status: AC
  Administered 2023-06-01: 5 mg via ORAL

## 2023-06-01 MED ORDER — APIXABAN 2.5 MG PO TABS
2.5000 mg | ORAL_TABLET | Freq: Two times a day (BID) | ORAL | Status: DC
  Administered 2023-06-02 – 2023-06-04 (×5): 2.5 mg via ORAL
  Filled 2023-06-01 (×5): qty 1

## 2023-06-01 MED ORDER — MIDAZOLAM HCL 2 MG/2ML IJ SOLN
INTRAMUSCULAR | Status: AC
  Filled 2023-06-01: qty 2

## 2023-06-01 MED ORDER — DEXAMETHASONE SODIUM PHOSPHATE 10 MG/ML IJ SOLN
INTRAMUSCULAR | Status: AC
  Filled 2023-06-01: qty 1

## 2023-06-01 MED ORDER — NEOMYCIN-POLYMYXIN B GU 40-200000 IR SOLN
Status: AC
  Filled 2023-06-01: qty 20

## 2023-06-01 MED ORDER — MORPHINE SULFATE (PF) 2 MG/ML IV SOLN
2.0000 mg | INTRAVENOUS | Status: DC | PRN
  Administered 2023-06-01: 2 mg via INTRAVENOUS
  Administered 2023-06-01: 4 mg via INTRAVENOUS
  Administered 2023-06-02 (×2): 2 mg via INTRAVENOUS
  Administered 2023-06-03 – 2023-06-04 (×3): 4 mg via INTRAVENOUS
  Filled 2023-06-01: qty 1
  Filled 2023-06-01: qty 2
  Filled 2023-06-01 (×3): qty 1
  Filled 2023-06-01: qty 2
  Filled 2023-06-01: qty 1
  Filled 2023-06-01 (×2): qty 2

## 2023-06-01 MED ORDER — MIDAZOLAM HCL 2 MG/2ML IJ SOLN
INTRAMUSCULAR | Status: DC | PRN
  Administered 2023-06-01: 2 mg via INTRAVENOUS

## 2023-06-01 MED ORDER — HYDROMORPHONE HCL 1 MG/ML IJ SOLN
INTRAMUSCULAR | Status: DC | PRN
  Administered 2023-06-01: 1 mg via INTRAVENOUS

## 2023-06-01 MED ORDER — LABETALOL HCL 5 MG/ML IV SOLN
INTRAVENOUS | Status: AC
  Filled 2023-06-01: qty 4

## 2023-06-01 MED ORDER — BUPIVACAINE LIPOSOME 1.3 % IJ SUSP
INTRAMUSCULAR | Status: AC
  Filled 2023-06-01: qty 20

## 2023-06-01 MED ORDER — ACETAMINOPHEN 325 MG PO TABS: 325.0000 mg | ORAL_TABLET | Freq: Four times a day (QID) | ORAL | Status: DC | PRN

## 2023-06-01 MED ORDER — EPINEPHRINE PF 1 MG/ML IJ SOLN
INTRAMUSCULAR | Status: AC
  Filled 2023-06-01: qty 1

## 2023-06-01 MED ORDER — POLYETHYLENE GLYCOL 3350 17 G PO PACK
17.0000 g | PACK | Freq: Every day | ORAL | Status: DC
  Administered 2023-06-01 – 2023-06-04 (×4): 17 g via ORAL
  Filled 2023-06-01 (×4): qty 1

## 2023-06-01 MED ORDER — ORAL CARE MOUTH RINSE: 15.0000 mL | Freq: Once | OROMUCOSAL | Status: AC

## 2023-06-01 MED ORDER — ACETAMINOPHEN 10 MG/ML IV SOLN
INTRAVENOUS | Status: DC | PRN
  Administered 2023-06-01: 1000 mg via INTRAVENOUS

## 2023-06-01 MED ORDER — ONDANSETRON HCL 4 MG/2ML IJ SOLN
INTRAMUSCULAR | Status: DC | PRN
  Administered 2023-06-01: 4 mg via INTRAVENOUS

## 2023-06-01 MED ORDER — TRANEXAMIC ACID-NACL 1000-0.7 MG/100ML-% IV SOLN
INTRAVENOUS | Status: AC
  Filled 2023-06-01: qty 100

## 2023-06-01 MED ORDER — CHLORHEXIDINE GLUCONATE 0.12 % MT SOLN
15.0000 mL | Freq: Once | OROMUCOSAL | Status: AC
  Administered 2023-06-01: 15 mL via OROMUCOSAL

## 2023-06-01 MED ORDER — BUPIVACAINE HCL (PF) 0.5 % IJ SOLN
INTRAMUSCULAR | Status: AC
  Filled 2023-06-01: qty 30

## 2023-06-01 MED ORDER — ROCURONIUM BROMIDE 100 MG/10ML IV SOLN
INTRAVENOUS | Status: DC | PRN
  Administered 2023-06-01: 30 mg via INTRAVENOUS
  Administered 2023-06-01: 50 mg via INTRAVENOUS

## 2023-06-01 MED ORDER — HYDROMORPHONE HCL 1 MG/ML IJ SOLN
INTRAMUSCULAR | Status: AC
  Filled 2023-06-01: qty 1

## 2023-06-01 MED ORDER — ONDANSETRON HCL 4 MG/2ML IJ SOLN
INTRAMUSCULAR | Status: AC
  Filled 2023-06-01: qty 2

## 2023-06-01 MED ORDER — SENNOSIDES-DOCUSATE SODIUM 8.6-50 MG PO TABS
1.0000 | ORAL_TABLET | Freq: Two times a day (BID) | ORAL | Status: DC
  Administered 2023-06-01 – 2023-06-04 (×7): 1 via ORAL
  Filled 2023-06-01 (×7): qty 1

## 2023-06-01 MED ORDER — FENTANYL CITRATE (PF) 100 MCG/2ML IJ SOLN
INTRAMUSCULAR | Status: AC
  Filled 2023-06-01: qty 2

## 2023-06-01 MED ORDER — DIPHENHYDRAMINE HCL 12.5 MG/5ML PO ELIX: 12.5000 mg | ORAL_SOLUTION | ORAL | Status: DC | PRN

## 2023-06-01 MED ORDER — TRANEXAMIC ACID-NACL 1000-0.7 MG/100ML-% IV SOLN
INTRAVENOUS | Status: DC | PRN
  Administered 2023-06-01 (×2): 1000 mg via INTRAVENOUS

## 2023-06-01 MED ORDER — LABETALOL HCL 5 MG/ML IV SOLN
INTRAVENOUS | Status: DC | PRN
  Administered 2023-06-01 (×2): 5 mg via INTRAVENOUS

## 2023-06-01 MED ORDER — LACTATED RINGERS IV SOLN: INTRAVENOUS | Status: DC

## 2023-06-01 MED ORDER — PROPOFOL 10 MG/ML IV BOLUS
INTRAVENOUS | Status: DC | PRN
  Administered 2023-06-01: 200 mg via INTRAVENOUS

## 2023-06-01 MED ORDER — SODIUM CHLORIDE FLUSH 0.9 % IV SOLN
INTRAVENOUS | Status: AC
  Filled 2023-06-01: qty 40

## 2023-06-01 MED ORDER — BISACODYL 10 MG RE SUPP: 10.0000 mg | Freq: Every day | RECTAL | Status: DC | PRN

## 2023-06-01 MED ORDER — MAGNESIUM HYDROXIDE 400 MG/5ML PO SUSP: 30.0000 mL | Freq: Every day | ORAL | Status: DC | PRN

## 2023-06-01 MED ORDER — METOCLOPRAMIDE HCL 5 MG/ML IJ SOLN: 5.0000 mg | Freq: Three times a day (TID) | INTRAMUSCULAR | Status: DC | PRN

## 2023-06-01 MED ORDER — FLEET ENEMA RE ENEM: 1.0000 | ENEMA | Freq: Once | RECTAL | Status: DC | PRN

## 2023-06-01 MED ORDER — KETOROLAC TROMETHAMINE 15 MG/ML IJ SOLN
7.5000 mg | Freq: Four times a day (QID) | INTRAMUSCULAR | Status: AC
  Administered 2023-06-01 – 2023-06-02 (×3): 7.5 mg via INTRAVENOUS
  Filled 2023-06-01 (×4): qty 1

## 2023-06-01 MED ORDER — ONDANSETRON HCL 4 MG PO TABS: 4.0000 mg | ORAL_TABLET | Freq: Four times a day (QID) | ORAL | Status: DC | PRN

## 2023-06-01 MED ORDER — KETOROLAC TROMETHAMINE 15 MG/ML IJ SOLN
15.0000 mg | Freq: Once | INTRAMUSCULAR | Status: AC
  Administered 2023-06-01: 15 mg via INTRAVENOUS

## 2023-06-01 MED ORDER — LIDOCAINE HCL (CARDIAC) PF 100 MG/5ML IV SOSY
PREFILLED_SYRINGE | INTRAVENOUS | Status: DC | PRN
  Administered 2023-06-01: 60 mg via INTRAVENOUS

## 2023-06-01 MED ORDER — DOCUSATE SODIUM 100 MG PO CAPS
100.0000 mg | ORAL_CAPSULE | Freq: Two times a day (BID) | ORAL | Status: DC
  Administered 2023-06-01 – 2023-06-04 (×6): 100 mg via ORAL
  Filled 2023-06-01 (×6): qty 1

## 2023-06-01 MED ORDER — SUGAMMADEX SODIUM 200 MG/2ML IV SOLN
INTRAVENOUS | Status: DC | PRN
  Administered 2023-06-01: 200 mg via INTRAVENOUS

## 2023-06-01 MED ORDER — FENTANYL CITRATE (PF) 100 MCG/2ML IJ SOLN
25.0000 ug | INTRAMUSCULAR | Status: DC | PRN
  Administered 2023-06-01 (×3): 50 ug via INTRAVENOUS

## 2023-06-01 MED ORDER — VANCOMYCIN HCL 1000 MG IV SOLR
INTRAVENOUS | Status: DC | PRN
  Administered 2023-06-01: 1000 mg

## 2023-06-01 MED ORDER — DROPERIDOL 2.5 MG/ML IJ SOLN: 0.6250 mg | Freq: Once | INTRAMUSCULAR | Status: DC | PRN

## 2023-06-01 MED ORDER — CHLORHEXIDINE GLUCONATE 0.12 % MT SOLN
OROMUCOSAL | Status: AC
  Filled 2023-06-01: qty 15

## 2023-06-01 MED ORDER — HYDROMORPHONE HCL 1 MG/ML IJ SOLN
0.5000 mg | INTRAMUSCULAR | Status: AC | PRN
  Administered 2023-06-01 (×4): 0.5 mg via INTRAVENOUS

## 2023-06-01 MED ORDER — OXYCODONE HCL 5 MG PO TABS
ORAL_TABLET | ORAL | Status: AC
  Filled 2023-06-01: qty 1

## 2023-06-01 MED ORDER — TRIAMCINOLONE ACETONIDE 40 MG/ML IJ SUSP
INTRAMUSCULAR | Status: AC
  Filled 2023-06-01: qty 2

## 2023-06-01 MED ORDER — ONDANSETRON HCL 4 MG/2ML IJ SOLN: 4.0000 mg | Freq: Four times a day (QID) | INTRAMUSCULAR | Status: DC | PRN

## 2023-06-01 MED ORDER — DEXAMETHASONE SODIUM PHOSPHATE 10 MG/ML IJ SOLN
INTRAMUSCULAR | Status: DC | PRN
  Administered 2023-06-01: 5 mg via INTRAVENOUS

## 2023-06-01 MED ORDER — METOCLOPRAMIDE HCL 5 MG PO TABS: 5.0000 mg | ORAL_TABLET | Freq: Three times a day (TID) | ORAL | Status: DC | PRN

## 2023-06-01 MED ORDER — ACETAMINOPHEN 10 MG/ML IV SOLN
INTRAVENOUS | Status: AC
  Filled 2023-06-01: qty 100

## 2023-06-01 MED ORDER — SODIUM CHLORIDE 0.9 % IV SOLN: INTRAVENOUS | Status: DC

## 2023-06-01 MED ORDER — FENTANYL CITRATE (PF) 100 MCG/2ML IJ SOLN
INTRAMUSCULAR | Status: DC | PRN
  Administered 2023-06-01 (×2): 50 ug via INTRAVENOUS

## 2023-06-01 SURGICAL SUPPLY — 56 items
APL PRP STRL LF DISP 70% ISPRP (MISCELLANEOUS) ×1
BLADE SAW SAG 25X90X1.19 (BLADE) ×2 IMPLANT
BNDG CMPR 5X6 CHSV STRCH STRL (GAUZE/BANDAGES/DRESSINGS) ×1
BNDG CMPR 6 X 5 YARDS HK CLSR (GAUZE/BANDAGES/DRESSINGS) ×1
BNDG COHESIVE 6X5 TAN ST LF (GAUZE/BANDAGES/DRESSINGS) ×2 IMPLANT
BNDG ELASTIC 6INX 5YD STR LF (GAUZE/BANDAGES/DRESSINGS) ×2 IMPLANT
CEMENT VACUUM MIXING SYSTEM (MISCELLANEOUS) ×2 IMPLANT
CHLORAPREP W/TINT 26 (MISCELLANEOUS) ×2 IMPLANT
COOLER POLAR GLACIER W/PUMP (MISCELLANEOUS) ×2 IMPLANT
CUFF TOURN SGL QUICK 24 (TOURNIQUET CUFF)
CUFF TOURN SGL QUICK 34 (TOURNIQUET CUFF) ×1
CUFF TRNQT CYL 24X4X16.5-23 (TOURNIQUET CUFF) IMPLANT
CUFF TRNQT CYL 34X4.125X (TOURNIQUET CUFF) IMPLANT
DRAPE IMP U-DRAPE 54X76 (DRAPES) ×2 IMPLANT
DRAPE SURG 17X11 SM STRL (DRAPES) ×4 IMPLANT
DRSG MEPILEX SACRM 8.7X9.8 (GAUZE/BANDAGES/DRESSINGS) ×2 IMPLANT
DRSG OPSITE POSTOP 4X14 (GAUZE/BANDAGES/DRESSINGS) ×2 IMPLANT
ELECT CAUTERY BLADE 6.4 (BLADE) IMPLANT
ELECT REM PT RETURN 9FT ADLT (ELECTROSURGICAL) ×1
ELECTRODE REM PT RTRN 9FT ADLT (ELECTROSURGICAL) ×2 IMPLANT
EVACUATOR 1/8 PVC DRAIN (DRAIN) IMPLANT
GLOVE BIO SURGEON STRL SZ7.5 (GLOVE) ×8 IMPLANT
GLOVE BIO SURGEON STRL SZ8 (GLOVE) ×8 IMPLANT
GLOVE BIOGEL PI IND STRL 8 (GLOVE) ×2 IMPLANT
GLOVE INDICATOR 8.0 STRL GRN (GLOVE) ×2 IMPLANT
GOWN STRL REUS W/ TWL LRG LVL3 (GOWN DISPOSABLE) ×2 IMPLANT
GOWN STRL REUS W/ TWL XL LVL3 (GOWN DISPOSABLE) ×2 IMPLANT
GOWN STRL REUS W/TWL LRG LVL3 (GOWN DISPOSABLE) ×1
GOWN STRL REUS W/TWL XL LVL3 (GOWN DISPOSABLE) ×1
HOOD PEEL AWAY T7 (MISCELLANEOUS) ×6 IMPLANT
IMMBOLIZER KNEE 19 BLUE UNIV (SOFTGOODS) ×2 IMPLANT
INSERT TIB ARTISURF SZ8-11X12 (Insert) IMPLANT
IV NS IRRIG 3000ML ARTHROMATIC (IV SOLUTION) ×2 IMPLANT
KIT STIMULAN RAPID CURE 5CC (Orthopedic Implant) IMPLANT
KIT TURNOVER KIT A (KITS) ×2 IMPLANT
MANIFOLD NEPTUNE II (INSTRUMENTS) ×2 IMPLANT
NDL SPNL 20GX3.5 QUINCKE YW (NEEDLE) ×2 IMPLANT
NEEDLE SPNL 20GX3.5 QUINCKE YW (NEEDLE) ×1 IMPLANT
NS IRRIG 500ML POUR BTL (IV SOLUTION) ×2 IMPLANT
PACK TOTAL KNEE (MISCELLANEOUS) ×2 IMPLANT
PAD WRAPON POLAR KNEE (MISCELLANEOUS) ×2 IMPLANT
PENCIL SMOKE EVACUATOR (MISCELLANEOUS) ×2 IMPLANT
PULSAVAC PLUS IRRIG FAN TIP (DISPOSABLE) ×1
STAPLER SKIN PROX 35W (STAPLE) ×2 IMPLANT
SUCTION TUBE FRAZIER 10FR DISP (SUCTIONS) ×2 IMPLANT
SUT VIC AB 0 CT1 36 (SUTURE) ×8 IMPLANT
SUT VIC AB 2-0 CT1 27 (SUTURE) ×4
SUT VIC AB 2-0 CT1 TAPERPNT 27 (SUTURE) ×8 IMPLANT
SYR 10ML LL (SYRINGE) ×2 IMPLANT
SYR 20ML LL LF (SYRINGE) ×2 IMPLANT
SYR 30ML LL (SYRINGE) ×4 IMPLANT
TIP FAN IRRIG PULSAVAC PLUS (DISPOSABLE) ×2 IMPLANT
TRAP FLUID SMOKE EVACUATOR (MISCELLANEOUS) ×2 IMPLANT
TRAY FOLEY SLVR 16FR LF STAT (SET/KITS/TRAYS/PACK) ×2 IMPLANT
WATER STERILE IRR 500ML POUR (IV SOLUTION) ×2 IMPLANT
WRAPON POLAR PAD KNEE (MISCELLANEOUS) ×1

## 2023-06-01 NOTE — Progress Notes (Signed)
Pharmacy Antibiotic Note  DARIOUS Bender is a 67 y.o. male admitted on 05/28/2023 with sepsis. PMH significant for COPD and tobacco use. In ED, patient is afebrile with WBC 18.6. Pharmacy has been consulted for vancomycin and cefepime dosing.  Day 4 of antibiotics. Renal function stable and consistent with baseline.   Plan: Continue vancomycin 1000 mg IV every 12 hours  Goal AUC 400-550 Estimated AUC 496.5, Cmin 13.7 Used Scr 0.83, Vd 0.72 Will obtain levels this evening/tomorrow morning Continue cefepime 2 g IV every 8 hours based on current renal function  Monitor renal function, clinical status, culture data, and LOT  Height: 5\' 9"  (175.3 cm) Weight: 74.5 kg (164 lb 3.9 oz) IBW/kg (Calculated) : 70.7  Temp (24hrs), Avg:98.1 F (36.7 C), Min:98 F (36.7 C), Max:98.3 F (36.8 C)  Recent Labs  Lab 05/28/23 1351 05/28/23 1500 05/28/23 2308 05/29/23 0538 06/01/23 0415 06/01/23 0844  WBC 18.6*  --   --  13.9*  --  10.6*  CREATININE 0.96  --   --  0.74 0.83  --   LATICACIDVEN 2.6* 2.2* 1.3  --   --   --     Estimated Creatinine Clearance: 85.2 mL/min (by C-G formula based on SCr of 0.83 mg/dL).    Allergies  Allergen Reactions   Yellow Jacket Venom [Bee Venom] Anaphylaxis and Shortness Of Breath   Codeine Itching   Metaxalone Swelling   Venlafaxine Other (See Comments)    Loss of appetite, jittery   Zyban [Bupropion] Other (See Comments)    hallucinations   Fluoxetine Anxiety    anger   Antimicrobials this admission: vancomycin 8/22 >>  cefepime 8/22 >>   Dose adjustments this admission: N/A  Microbiology results: 8/22 BCx: NG x 4 days 8/23 R knee aspirate: NG x 3 days  Thank you for involving pharmacy in this patient's care.   Elliot Gurney, PharmD, BCPS Clinical Pharmacist  06/01/2023 12:27 PM

## 2023-06-01 NOTE — Progress Notes (Signed)
PROGRESS NOTE    Anthony Bender  KXF:[id number] DOB: 1956/05/27 DOA: 05/28/2023 PCP: Kennis Carina, MD    Brief Narrative:  67 year old male with history of depression, anxiety, on anticoagulation with Eliquis, restless leg syndrome, hyperlipidemia, hypertension, insomnia, who presents emergency department for chief concerns of chills for 3 days and vomiting.  Subsequently developed chills and sweats the day prior to presentation.  Also reports loss of sense of taste.  He is 10 days status post right total knee arthroplasty.  Elevated lactic acid and white blood cell count on admission.  8/23: Status post bedside arthrocentesis with orthopedic surgery on 8/23 8/24: Abundant inflammatory cells noted on arthrocentesis culture data.  No organisms isolated at this time.  Patient feeling better 8/26: N.p.o. for OR with orthopedics today  Assessment & Plan:   Principal Problem:   Severe sepsis with acute organ dysfunction (HCC) Active Problems:   Status post total knee replacement using cement, right   Elevated troponin   Benign prostatic hyperplasia   Chronic insomnia   COPD (chronic obstructive pulmonary disease) (HCC)   Hyperlipidemia, unspecified   Leukocytosis   Depression   Severe sepsis with acute organ dysfunction (HCC) Potential source right knee.  Status post arthrocentesis bedside with orthopedic surgery.  White blood cell count is decreasing.  No fevers over interval.  Patient starting to feel better.  Cultures remain with no growth although abundant PMNs Plan: Continue broad-spectrum coverage for now No organisms isolated from body fluid culture OR today for washout Can likely de-escalate/discontinue antibiotics within 24 hours   Elevated troponin Suspect secondary to severe sepsis Low suspicion for ACS DC telemetry EKG as needed chest pain   Status post total knee replacement using cement, right On 05/19/2023 Orthopedics following Back to the OR 8/26    Depression Duloxetine 60 mg daily resumed   Leukocytosis Suspect secondary to sepsis Downtrending   Hyperlipidemia, unspecified Rosuvastatin 10 mg nightly resumed   COPD (chronic obstructive pulmonary disease) (HCC) Does not appear to be in acute exacerbation As needed bronchodilators   Chronic insomnia Zolpidem 5 mg nightly as needed for sleep  DVT prophylaxis: Eliquis Code Status: Full Family Communication: Spouse at bedside 8/23, 8/25 Disposition Plan: Status is: Inpatient Remains inpatient appropriate because: Sepsis on IV antibiotics   Level of care: Med-Surg  Consultants:  Orthopedics  Procedures:  Right knee arthrocentesis  Antimicrobials: Vancomycin Cefepime Flagyl   Subjective: Seen and examined.  No acute events.  Resting in bed  Objective: Vitals:   05/31/23 1657 05/31/23 2101 06/01/23 0453 06/01/23 0814  BP: (!) 147/91 133/83 (!) 140/88 (!) 140/80  Pulse: 74 84 76 72  Resp: 16     Temp: 98.1 F (36.7 C) 98.3 F (36.8 C) 98.1 F (36.7 C) 98 F (36.7 C)  TempSrc:  Oral Oral Oral  SpO2: 100% 98% 100% 100%  Weight:      Height:       No intake or output data in the 24 hours ending 06/01/23 1107  Filed Weights   05/28/23 1346 05/29/23 0402  Weight: 83.9 kg 74.5 kg    Examination:  General exam: NAD Respiratory system: Lungs.  Normal work of breathing.  Room air Cardiovascular system: S1-S2, RRR, no murmurs, no pedal edema Gastrointestinal system: Soft,/ND, normal bowel sounds Central nervous system: Alert and oriented. No focal neurological deficits. Extremities: Right knee swollen, tender to touch, decreased ROM Skin: No rashes, lesions or ulcers Psychiatry: Judgement and insight appear normal. Mood & affect appropriate.  Data Reviewed: I have personally reviewed following labs and imaging studies  CBC: Recent Labs  Lab 05/28/23 1351 05/29/23 0538 06/01/23 0844  WBC 18.6* 13.9* 10.6*  NEUTROABS  --   --  7.8*  HGB  16.9 13.6 13.9  HCT 50.1 40.0 41.2  MCV 92.6 93.9 93.6  PLT 454* 315 290   Basic Metabolic Panel: Recent Labs  Lab 05/28/23 1351 05/29/23 0538 06/01/23 0415  NA 134* 135  --   K 3.5 3.8  --   CL 104 107  --   CO2 20* 23  --   GLUCOSE 128* 127*  --   BUN 23 19  --   CREATININE 0.96 0.74 0.83  CALCIUM 9.6 9.2  --   MG  --  2.5*  --    GFR: Estimated Creatinine Clearance: 85.2 mL/min (by C-G formula based on SCr of 0.83 mg/dL). Liver Function Tests: Recent Labs  Lab 05/28/23 1351 05/29/23 0538  AST 29 20  ALT 22 18  ALKPHOS 97 69  BILITOT 1.8* 1.3*  PROT 8.3* 6.5  ALBUMIN 4.3 3.5   Recent Labs  Lab 05/28/23 1351  LIPASE 44   No results for input(s): "AMMONIA" in the last 168 hours. Coagulation Profile: Recent Labs  Lab 05/28/23 1351  INR 1.0   Cardiac Enzymes: No results for input(s): "CKTOTAL", "CKMB", "CKMBINDEX", "TROPONINI" in the last 168 hours. BNP (last 3 results) No results for input(s): "PROBNP" in the last 8760 hours. HbA1C: No results for input(s): "HGBA1C" in the last 72 hours.  CBG: No results for input(s): "GLUCAP" in the last 168 hours. Lipid Profile: No results for input(s): "CHOL", "HDL", "LDLCALC", "TRIG", "CHOLHDL", "LDLDIRECT" in the last 72 hours. Thyroid Function Tests: No results for input(s): "TSH", "T4TOTAL", "FREET4", "T3FREE", "THYROIDAB" in the last 72 hours. Anemia Panel: No results for input(s): "VITAMINB12", "FOLATE", "FERRITIN", "TIBC", "IRON", "RETICCTPCT" in the last 72 hours. Sepsis Labs: Recent Labs  Lab 05/28/23 1351 05/28/23 1500 05/28/23 2308  LATICACIDVEN 2.6* 2.2* 1.3    Recent Results (from the past 240 hour(s))  SARS Coronavirus 2 by RT PCR (hospital order, performed in [facility] hospital lab) *cepheid single result test* Anterior Nasal Swab     Status: None   Collection Time: 05/28/23  1:51 PM   Specimen: Anterior Nasal Swab  Result Value Ref Range Status   SARS Coronavirus 2 by RT PCR NEGATIVE  NEGATIVE Final    Comment: (NOTE) SARS-CoV-2 target nucleic acids are NOT DETECTED.  The SARS-CoV-2 RNA is generally detectable in upper and lower respiratory specimens during the acute phase of infection. The lowest concentration of SARS-CoV-2 viral copies this assay can detect is 250 copies / mL. A negative result does not preclude SARS-CoV-2 infection and should not be used as the sole basis for treatment or other patient management decisions.  A negative result may occur with improper specimen collection / handling, submission of specimen other than nasopharyngeal swab, presence of viral mutation(s) within the areas targeted by this assay, and inadequate number of viral copies (<250 copies / mL). A negative result must be combined with clinical observations, patient history, and epidemiological information.  Fact Sheet for Patients:   RoadLapTop.co.za  Fact Sheet for Healthcare Providers: http://kim-miller.com/  This test is not yet approved or  cleared by the [place] FDA and has been authorized for detection and/or diagnosis of SARS-CoV-2 by FDA under an Emergency Use Authorization (EUA).  This EUA will remain in effect (meaning this test can be used)  for the duration of the COVID-19 declaration under Section 564(b)(1) of the Act, 21 U.S.C. section 360bbb-3(b)(1), unless the authorization is terminated or revoked sooner.  Performed at [facility], [address] [place], [place] [address]   Resp panel by RT-PCR (RSV, Flu A&B, Covid) Anterior Nasal Swab     Status: None   Collection Time: 05/28/23  5:18 PM   Specimen: Anterior Nasal Swab  Result Value Ref Range Status   SARS Coronavirus 2 by RT PCR NEGATIVE NEGATIVE Final    Comment: (NOTE) SARS-CoV-2 target nucleic acids are NOT DETECTED.  The SARS-CoV-2 RNA is generally detectable in upper respiratory specimens during the acute phase of infection. The  lowest concentration of SARS-CoV-2 viral copies this assay can detect is 138 copies/mL. A negative result does not preclude SARS-Cov-2 infection and should not be used as the sole basis for treatment or other patient management decisions. A negative result may occur with  improper specimen collection/handling, submission of specimen other than nasopharyngeal swab, presence of viral mutation(s) within the areas targeted by this assay, and inadequate number of viral copies(<138 copies/mL). A negative result must be combined with clinical observations, patient history, and epidemiological information. The expected result is Negative.  Fact Sheet for Patients:  BloggerCourse.com  Fact Sheet for Healthcare Providers:  SeriousBroker.it  This test is no t yet approved or cleared by the [place] FDA and  has been authorized for detection and/or diagnosis of SARS-CoV-2 by FDA under an Emergency Use Authorization (EUA). This EUA will remain  in effect (meaning this test can be used) for the duration of the COVID-19 declaration under Section 564(b)(1) of the Act, 21 U.S.C.section 360bbb-3(b)(1), unless the authorization is terminated  or revoked sooner.       Influenza A by PCR NEGATIVE NEGATIVE Final   Influenza B by PCR NEGATIVE NEGATIVE Final    Comment: (NOTE) The Xpert Xpress SARS-CoV-2/FLU/RSV plus assay is intended as an aid in the diagnosis of influenza from Nasopharyngeal swab specimens and should not be used as a sole basis for treatment. Nasal washings and aspirates are unacceptable for Xpert Xpress SARS-CoV-2/FLU/RSV testing.  Fact Sheet for Patients: BloggerCourse.com  Fact Sheet for Healthcare Providers: SeriousBroker.it  This test is not yet approved or cleared by the [place] FDA and has been authorized for detection and/or diagnosis of SARS-CoV-2 by FDA under  an Emergency Use Authorization (EUA). This EUA will remain in effect (meaning this test can be used) for the duration of the COVID-19 declaration under Section 564(b)(1) of the Act, 21 U.S.C. section 360bbb-3(b)(1), unless the authorization is terminated or revoked.     Resp Syncytial Virus by PCR NEGATIVE NEGATIVE Final    Comment: (NOTE) Fact Sheet for Patients: BloggerCourse.com  Fact Sheet for Healthcare Providers: SeriousBroker.it  This test is not yet approved or cleared by the [place] FDA and has been authorized for detection and/or diagnosis of SARS-CoV-2 by FDA under an Emergency Use Authorization (EUA). This EUA will remain in effect (meaning this test can be used) for the duration of the COVID-19 declaration under Section 564(b)(1) of the Act, 21 U.S.C. section 360bbb-3(b)(1), unless the authorization is terminated or revoked.  Performed at [facility], [address] [place], [place] [address]   Culture, blood (Routine X 2) w Reflex to ID Panel     Status: None (Preliminary result)   Collection Time: 05/28/23  7:45 PM   Specimen: BLOOD  Result Value Ref Range Status   Specimen Description  BLOOD  Final   Special Requests   Final    BOTTLES DRAWN AEROBIC AND ANAEROBIC Blood Culture results may not be optimal due to an inadequate volume of blood received in culture bottles   Culture   Final    NO GROWTH 4 DAYS Performed at [facility], [address] [place], [place] [address]    Report Status PENDING  Incomplete  Culture, blood (Routine X 2) w Reflex to ID Panel     Status: None (Preliminary result)   Collection Time: 05/28/23  7:48 PM   Specimen: BLOOD  Result Value Ref Range Status   Specimen Description BLOOD  Final   Special Requests   Final    BOTTLES DRAWN AEROBIC AND ANAEROBIC Blood Culture results may not be optimal due to an inadequate volume of blood received in culture  bottles   Culture   Final    NO GROWTH 4 DAYS Performed at [facility], [address] [place], [place] [address]    Report Status PENDING  Incomplete  MRSA Next Gen by PCR, Nasal     Status: None   Collection Time: 05/28/23 11:17 PM   Specimen: Nasal Mucosa; Nasal Swab  Result Value Ref Range Status   MRSA by PCR Next Gen NOT DETECTED NOT DETECTED Final    Comment: (NOTE) The GeneXpert MRSA Assay (FDA approved for NASAL specimens only), is one component of a comprehensive MRSA colonization surveillance program. It is not intended to diagnose MRSA infection nor to guide or monitor treatment for MRSA infections. Test performance is not FDA approved in patients less than 34 years old. Performed at [facility], [address] [place], [place] [address]   Body fluid culture w Gram Stain     Status: None   Collection Time: 05/29/23  8:08 AM   Specimen: Synovium; Body Fluid  Result Value Ref Range Status   Specimen Description   Final    SYNOVIAL Performed at [facility], [address] [place], [place] [address]    Special Requests   Final    RIGHT KNEE ASPIRATE Performed at [facility], [address] [place], [place] [address]    Gram Stain   Final    ABUNDANT WBC PRESENT,BOTH PMN AND MONONUCLEAR NO ORGANISMS SEEN    Culture   Final    NO GROWTH 3 DAYS Performed at [facility], [address], [place], [place] [address]    Report Status 06/01/2023 FINAL  Final  SARS Coronavirus 2 by RT PCR (hospital order, performed in [facility] hospital lab) *cepheid single result test* Anterior Nasal Swab     Status: None   Collection Time: 06/01/23  8:45 AM   Specimen: Anterior Nasal Swab  Result Value Ref Range Status   SARS Coronavirus 2 by RT PCR NEGATIVE NEGATIVE Final    Comment: (NOTE) SARS-CoV-2 target nucleic acids are NOT DETECTED.  The SARS-CoV-2 RNA is generally detectable in upper and lower respiratory specimens  during the acute phase of infection. The lowest concentration of SARS-CoV-2 viral copies this assay can detect is 250 copies / mL. A negative result does not preclude SARS-CoV-2 infection and should not be used as the sole basis for treatment or other patient management decisions.  A negative result may occur with improper specimen collection / handling, submission of specimen other than nasopharyngeal swab, presence of viral mutation(s) within the areas targeted by this assay, and inadequate number of viral copies (<250 copies /  mL). A negative result must be combined with clinical observations, patient history, and epidemiological information.  Fact Sheet for Patients:   RoadLapTop.co.za  Fact Sheet for Healthcare Providers: http://kim-miller.com/  This test is not yet approved or  cleared by the [place] FDA and has been authorized for detection and/or diagnosis of SARS-CoV-2 by FDA under an Emergency Use Authorization (EUA).  This EUA will remain in effect (meaning this test can be used) for the duration of the COVID-19 declaration under Section 564(b)(1) of the Act, 21 U.S.C. section 360bbb-3(b)(1), unless the authorization is terminated or revoked sooner.  Performed at [facility], [address], [place], [place] [address]          Radiology Studies: US Venous Img Lower Unilateral Right (DVT)  Result Date: 05/31/2023 CLINICAL DATA:  Right lower extremity pain and edema. Recent right total knee replacement. Evaluate for DVT. EXAM: RIGHT LOWER EXTREMITY VENOUS DOPPLER ULTRASOUND TECHNIQUE: Gray-scale sonography with graded compression, as well as color Doppler and duplex ultrasound were performed to evaluate the lower extremity deep venous systems from the level of the common femoral vein and including the common femoral, femoral, profunda femoral, popliteal and calf veins including the posterior tibial, peroneal  and gastrocnemius veins when visible. The superficial great saphenous vein was also interrogated. Spectral Doppler was utilized to evaluate flow at rest and with distal augmentation maneuvers in the common femoral, femoral and popliteal veins. COMPARISON:  None Available. FINDINGS: Contralateral Common Femoral Vein: Respiratory phasicity is normal and symmetric with the symptomatic side. No evidence of thrombus. Normal compressibility. Common Femoral Vein: No evidence of thrombus. Normal compressibility, respiratory phasicity and response to augmentation. Saphenofemoral Junction: No evidence of thrombus. Normal compressibility and flow on color Doppler imaging. Profunda Femoral Vein: No evidence of thrombus. Normal compressibility and flow on color Doppler imaging. Femoral Vein: No evidence of thrombus. Normal compressibility, respiratory phasicity and response to augmentation. Popliteal Vein: No evidence of thrombus. Normal compressibility, respiratory phasicity and response to augmentation. Calf Veins: No evidence of thrombus. Normal compressibility and flow on color Doppler imaging. Superficial Great Saphenous Vein: No evidence of thrombus. Normal compressibility. Other Findings:  None. IMPRESSION: No evidence of DVT within the right lower extremity. Electronically Signed   By: Simonne Come M.D.   On: 05/31/2023 16:58        Scheduled Meds:  DULoxetine  60 mg Oral Daily   fluticasone furoate-vilanterol  1 puff Inhalation Daily   And   umeclidinium bromide  1 puff Inhalation Daily   nortriptyline  20 mg Oral QHS   pantoprazole  40 mg Oral BID AC   polyethylene glycol  17 g Oral Daily   pregabalin  150 mg Oral TID   rosuvastatin  10 mg Oral QHS   senna-docusate  1 tablet Oral BID   zolpidem  5 mg Oral QHS   Continuous Infusions:  ceFEPime (MAXIPIME) IV 2 g (06/01/23 0654)   metronidazole 500 mg (06/01/23 0842)   vancomycin 1,000 mg (06/01/23 0550)     LOS: 4 days     Tresa Moore,  MD [facility]   If 7PM-7AM, please contact night-coverage  06/01/2023, 11:07 AM

## 2023-06-01 NOTE — Progress Notes (Signed)
Patient ID: Anthony Bender, male   DOB: 1956/01/21, 67 y.o.   MRN: [id number]  Subjective: Overall, the patient feels that he is doing reasonably well.  He is feeling better and notes that the sweats and chills he was having on admission have resolved.  He still notes moderate discomfort in his knee, but is able to get up and move about with only minimal assistance.  He denies any reinjury to the knee, and denies any fevers.   Objective: Vital signs in last 24 hours: Temp:  [97.8 F (36.6 C)-98.8 F (37.1 C)] 98.1 F (36.7 C) (08/26 0453) Pulse Rate:  [74-86] 76 (08/26 0453) Resp:  [16] 16 (08/25 1657) BP: (133-147)/(77-91) 140/88 (08/26 0453) SpO2:  [97 %-100 %] 100 % (08/26 0453)  Intake/Output from previous day: No intake/output data recorded. Intake/Output this shift: No intake/output data recorded.  No results for input(s): "HGB" in the last 72 hours. No results for input(s): "WBC", "RBC", "HCT", "PLT" in the last 72 hours. Recent Labs    06/01/23 0415  CREATININE 0.83   No results for input(s): "LABPT", "INR" in the last 72 hours.  Physical Exam: Orthopedic examination again is limited to the right knee and lower extremity.  Areas of ecchymosis again are noted over the anterior, medial, and lateral aspects of the knee region.  There is mild swelling diffusely around the knee, as well as a small effusion.  No erythema, warmth, or significant tenderness is noted around the knee.  His surgical incision appears to be healing well and is without any drainage.  He is able to perform a straight leg raise and is active able to range his knee from 10 to 80 degrees with mild discomfort at the extremes of flexion and extension.  His patella tracks well.  The knee is stable to varus and valgus stressing.  He is neurovascularly intact to the right lower extremity and foot.  Assessment: Sepsis of unknown origin status post right total knee arthroplasty.  Plan: The treatment options,  including both surgical and nonsurgical choices, have been discussed in detail with the patient and his wife.  Although the patient is feeling better and his knee aspiration cultures so far are negative, the prevailing question is why did he become sick.  Therefore, I feel that the most prudent course of action at this time would be to perform a formal irrigation and debridement of his knee with polyethylene exchange to further minimize the likelihood that there may be any infection in his knee.  The risks (including bleeding, infection, nerve and/or blood vessel injury, persistent or recurrent pain, loosening or failure of the components, stiffness of the knee, dislocation, need for further surgery, blood clots, strokes, heart attacks or arrhythmias, pneumonia, etc.) and benefits of the surgical procedure were discussed.  The patient states his understanding and agrees to proceed.  A formal written consent will be obtained by the nursing staff.   Excell Seltzer Sharri Loya 06/01/2023, 7:59 AM

## 2023-06-01 NOTE — Progress Notes (Signed)
Mobility Specialist - Progress Note   06/01/23 0914  Mobility  Activity Ambulated independently in hallway;Stood at bedside;Dangled on edge of bed;Ambulated independently to bathroom  Level of Assistance Independent  Assistive Device Front wheel walker  Distance Ambulated (ft) 250 ft  RLE Weight Bearing WBAT  Activity Response Tolerated well  Mobility Referral Yes  $Mobility charge 1 Mobility  Mobility Specialist Start Time (ACUTE ONLY) 0901  Mobility Specialist Stop Time (ACUTE ONLY) 0912  Mobility Specialist Time Calculation (min) (ACUTE ONLY) 11 min   Pt EOB on RA upon arrival. Pt STS and ambulates in hallway and to/from bathroom indep. Pt returns to EOB  with needs in reach and wife present.   Terrilyn Saver  Mobility Specialist  06/01/23 9:16 AM

## 2023-06-01 NOTE — Anesthesia Preprocedure Evaluation (Signed)
Anesthesia Evaluation  Patient identified by MRN, date of birth, ID band Patient awake    Reviewed: Allergy & Precautions, NPO status , Patient's Chart, lab work & pertinent test results  History of Anesthesia Complications Negative for: history of anesthetic complications  Airway Mallampati: II  TM Distance: >3 FB Neck ROM: Full    Dental  (+) Edentulous Upper, Edentulous Lower, Dental Advidsory Given   Pulmonary neg shortness of breath, neg sleep apnea, COPD,  COPD inhaler, neg recent URI, Current Smoker and Patient abstained from smoking.   Pulmonary exam normal breath sounds clear to auscultation       Cardiovascular Exercise Tolerance: Good METShypertension, Pt. on medications (-) angina (-) CAD and (-) Past MI (-) dysrhythmias  Rhythm:Regular Rate:Normal - Systolic murmurs    Neuro/Psych  Headaches PSYCHIATRIC DISORDERS Anxiety Depression     Neuromuscular disease    GI/Hepatic hiatal hernia, PUD,GERD  Medicated and Controlled,,(+)     (-) substance abuse    Endo/Other  neg diabetes    Renal/GU negative Renal ROS     Musculoskeletal  (+) Arthritis ,    Abdominal   Peds  Hematology   Anesthesia Other Findings Past Medical History: No date: Anxiety No date: Aortic ectasia (HCC) No date: Asthma No date: Barrett's esophagus No date: BPH (benign prostatic hyperplasia) No date: COPD (chronic obstructive pulmonary disease) (HCC) No date: DDD (degenerative disc disease), lumbar No date: GERD (gastroesophageal reflux disease) No date: Headache No date: Hemorrhoids No date: Hyperlipidemia No date: Hyperlipidemia No date: Hypertension No date: Insomnia No date: Mild episode of recurrent major depressive disorder (HCC) No date: Neuropathy No date: Neuropathy, peripheral, idiopathic No date: Osteoarthritis No date: Primary osteoarthritis of right knee No date: Smoker No date: Tobacco use No date: Umbilical  hernia No date: Vitamin D deficiency  Reproductive/Obstetrics                             Anesthesia Physical Anesthesia Plan  ASA: 3  Anesthesia Plan: General   Post-op Pain Management: Ofirmev IV (intra-op)*   Induction: Intravenous  PONV Risk Score and Plan: 1 and Ondansetron, Dexamethasone, Midazolam and Treatment may vary due to age or medical condition  Airway Management Planned: Oral ETT  Additional Equipment: None  Intra-op Plan:   Post-operative Plan: Extubation in OR  Informed Consent: I have reviewed the patients History and Physical, chart, labs and discussed the procedure including the risks, benefits and alternatives for the proposed anesthesia with the patient or authorized representative who has indicated his/her understanding and acceptance.       Plan Discussed with: CRNA and Surgeon  Anesthesia Plan Comments: (Discussed R/B/A of neuraxial anesthesia technique with patient: - rare risks of spinal/epidural hematoma, nerve damage, infection - Risk of PDPH - Risk of nausea and vomiting - Risk of conversion to general anesthesia and its associated risks, including sore throat, damage to lips/eyes/teeth/oropharynx, and rare risks such as cardiac and respiratory events. - Risk of allergic reactions  Patient counseled on benefits of smoking cessation, and increased perioperative risks associated with continued smoking.   Discussed the role of CRNA in patient's perioperative care.  Patient voiced understanding.)       Anesthesia Quick Evaluation

## 2023-06-01 NOTE — Plan of Care (Signed)

## 2023-06-01 NOTE — Transfer of Care (Signed)
Immediate Anesthesia Transfer of Care Note  Patient: Anthony Bender  Procedure(s) Performed: TOTAL KNEE REVISION (POLY EXCHANGE) WASH OUT (Right: Knee)  Patient Location: PACU  Anesthesia Type:General  Level of Consciousness: awake, alert , and oriented  Airway & Oxygen Therapy: Patient Spontanous Breathing and Patient connected to face mask oxygen  Post-op Assessment: Report given to RN and Post -op Vital signs reviewed and stable  Post vital signs: Reviewed and stable  Last Vitals:  Vitals Value Taken Time  BP    Temp    Pulse 77 06/01/23 1538  Resp 18 06/01/23 1538  SpO2 99 % 06/01/23 1538  Vitals shown include unfiled device data.  Last Pain:  Vitals:   06/01/23 1248  TempSrc: Oral  PainSc: 0-No pain      Patients Stated Pain Goal: 3 (06/01/23 0550)  Complications: No notable events documented.

## 2023-06-01 NOTE — Anesthesia Procedure Notes (Signed)
Procedure Name: Intubation Date/Time: 06/01/2023 1:35 PM  Performed by: Monico Hoar, CRNAPre-anesthesia Checklist: Patient identified, Patient being monitored, Timeout performed, Emergency Drugs available and Suction available Patient Re-evaluated:Patient Re-evaluated prior to induction Oxygen Delivery Method: Circle system utilized Preoxygenation: Pre-oxygenation with 100% oxygen Induction Type: IV induction Ventilation: Mask ventilation without difficulty Laryngoscope Size: Mac and 4 Grade View: Grade I Tube type: Oral Tube size: 7.5 mm Number of attempts: 1 Airway Equipment and Method: Stylet Placement Confirmation: ETT inserted through vocal cords under direct vision, positive ETCO2 and breath sounds checked- equal and bilateral Secured at: 21 cm Tube secured with: Tape Dental Injury: Teeth and Oropharynx as per pre-operative assessment

## 2023-06-01 NOTE — Anesthesia Postprocedure Evaluation (Signed)
Anesthesia Post Note  Patient: MANDELA ABBOT  Procedure(s) Performed: TOTAL KNEE REVISION (POLY EXCHANGE) WASH OUT (Right: Knee)  Patient location during evaluation: PACU Anesthesia Type: General Level of consciousness: awake and alert, oriented and patient cooperative Pain management: satisfactory to patient Vital Signs Assessment: post-procedure vital signs reviewed and stable Respiratory status: spontaneous breathing, nonlabored ventilation and respiratory function stable Cardiovascular status: blood pressure returned to baseline and stable Postop Assessment: adequate PO intake Anesthetic complications: no   No notable events documented.   Last Vitals:  Vitals:   06/01/23 1630 06/01/23 1754  BP: 119/72 102/68  Pulse: 88 91  Resp: 15 16  Temp: 37.1 C (!) 36.3 C  SpO2: 94% 99%    Last Pain:  Vitals:   06/01/23 1754  TempSrc: Oral  PainSc: 10-Worst pain ever                 Reed Breech

## 2023-06-01 NOTE — Op Note (Signed)
06/01/2023  3:41 PM  Patient:   Anthony Bender  Pre-Op Diagnosis:   Possible septic arthritis status post right total knee arthroplasty.  Post-Op Diagnosis:   Same  Procedure:   Open irrigation and debridement with polyethylene exchange, right knee.  Surgeon:   Maryagnes Amos, MD  Assistant:   Jiles Prows, RN  Anesthesia:   GET  Findings:   As above.  Complications:   None  Fluids:   600 cc crystalloid  EBL:   5 cc  UOP:   None  TT:   70 minutes at 300 mmHg  Drains:   Two limbs to a Hemovac x 1  Closure:   Staples  Implants:   Zimmer Persona 12 mm medial congruent polyethylene insert  Brief Clinical Note:   The patient is a 67 year old male who is now 13 days status post a right total knee arthroplasty.  The patient began to develop chills and sweats 1 week ago.  He presented to the emergency room 4 days ago with the symptoms and was admitted for IV antibiotics after being diagnosed with sepsis based on elevated lactic acid and white blood cell count.  A knee aspiration so far has shown no growth on culture nor were any organisms seen on Gram stain, but the aspiration was done after the initiation of IV antibiotics.  His presentation was consistent with COVID, but he has had 3 negative COVID tests so far.  Therefore, the patient is being brought to the operating room for formal irrigation and debridement with polyethylene exchange of his right knee for possible septic arthritis status post his right total knee arthroplasty.  Procedure:   The patient was brought into the operating room and laid in the supine position.  After adequate general endotracheal intubation and anesthesia were obtained, the patient's previously placed staples were removed sequentially.  The leg was prepped with ChloraPrep solution before being draped sterilely.  Preoperative antibiotics were administered.  The leg was held in an elevated position while a timeout was performed to verify the appropriate  surgical site before the tourniquet was inflated to 300 mmHg.  The previous incision was reopened and carried down through the subcutaneous tissues to expose the superficial retinaculum which again was split the length of the incision.  The medial flap was elevated to expose the medial retinaculum.  The medial retinacular incision was reopened, removing retained Vicryl interrupted sutures throughout the process.  The knee was flexed and the previously placed 12 mm medial congruent polyethylene insert was removed using a quarter-inch osteotome.  Fluid and tissue samples were obtained and sent for culture sensitivity.  The exposed surface areas were lightly abraded using a Cobb elevator before the wound was copiously irrigated using 3 L of antibiotic irrigation followed by 3 L of sterile saline solution utilizing the pulse lavage system.  The new 12 mm medial congruent polyethylene insert was inserted and its appropriate locking mechanism verified.  A batch of dissolving antibiotic beads containing vancomycin was mixed on the back table.  When the beads had hardened, they were introduced along the medial and lateral gutters as well as in the suprapatellar pouch.    Two limbs to a Hemovac drain were placed before the quadriceps tendon and medial retinacular layer was reapproximated using #0 Vicryl interrupted sutures.  The subcutaneous tissues were closed in two layers using 2-0 Vicryl interrupted sutures before the skin was closed using staples.  A sterile occlusive dressing was applied over the wound before a  Polar Care device was applied.  The patient was then awakened, extubated, and returned to the recovery room in satisfactory condition after tolerating the procedure well.

## 2023-06-02 ENCOUNTER — Encounter: Payer: Self-pay | Admitting: Surgery

## 2023-06-02 DIAGNOSIS — R6883 Chills (without fever): Secondary | ICD-10-CM | POA: Diagnosis not present

## 2023-06-02 DIAGNOSIS — F32A Depression, unspecified: Secondary | ICD-10-CM

## 2023-06-02 DIAGNOSIS — F419 Anxiety disorder, unspecified: Secondary | ICD-10-CM

## 2023-06-02 DIAGNOSIS — A419 Sepsis, unspecified organism: Secondary | ICD-10-CM | POA: Diagnosis not present

## 2023-06-02 DIAGNOSIS — R5383 Other fatigue: Secondary | ICD-10-CM

## 2023-06-02 DIAGNOSIS — Z96652 Presence of left artificial knee joint: Secondary | ICD-10-CM

## 2023-06-02 DIAGNOSIS — D72829 Elevated white blood cell count, unspecified: Secondary | ICD-10-CM | POA: Diagnosis not present

## 2023-06-02 DIAGNOSIS — D649 Anemia, unspecified: Secondary | ICD-10-CM

## 2023-06-02 DIAGNOSIS — R6520 Severe sepsis without septic shock: Secondary | ICD-10-CM | POA: Diagnosis not present

## 2023-06-02 LAB — CBC
HCT: 36.8 % — ABNORMAL LOW (ref 39.0–52.0)
Hemoglobin: 12.3 g/dL — ABNORMAL LOW (ref 13.0–17.0)
MCH: 31.5 pg (ref 26.0–34.0)
MCHC: 33.4 g/dL (ref 30.0–36.0)
MCV: 94.4 fL (ref 80.0–100.0)
Platelets: 281 10*3/uL (ref 150–400)
RBC: 3.9 MIL/uL — ABNORMAL LOW (ref 4.22–5.81)
RDW: 15.1 % (ref 11.5–15.5)
WBC: 15.1 10*3/uL — ABNORMAL HIGH (ref 4.0–10.5)
nRBC: 0 % (ref 0.0–0.2)

## 2023-06-02 LAB — BASIC METABOLIC PANEL
Anion gap: 8 (ref 5–15)
BUN: 21 mg/dL (ref 8–23)
CO2: 24 mmol/L (ref 22–32)
Calcium: 8.7 mg/dL — ABNORMAL LOW (ref 8.9–10.3)
Chloride: 101 mmol/L (ref 98–111)
Creatinine, Ser: 0.79 mg/dL (ref 0.61–1.24)
GFR, Estimated: >60 mL/min (ref >60)
Glucose, Bld: 129 mg/dL — ABNORMAL HIGH (ref 70–99)
Potassium: 3.9 mmol/L (ref 3.5–5.1)
Sodium: 133 mmol/L — ABNORMAL LOW (ref 135–145)

## 2023-06-02 LAB — CULTURE, BLOOD (ROUTINE X 2)
Culture: NO GROWTH
Culture: NO GROWTH

## 2023-06-02 LAB — VANCOMYCIN, TROUGH: Vancomycin Tr: 11 ug/mL — ABNORMAL LOW (ref 15–20)

## 2023-06-02 MED ORDER — VANCOMYCIN HCL 1250 MG/250ML IV SOLN
1250.0000 mg | Freq: Two times a day (BID) | INTRAVENOUS | Status: DC
  Administered 2023-06-02 – 2023-06-04 (×4): 1250 mg via INTRAVENOUS
  Filled 2023-06-02 (×5): qty 250

## 2023-06-02 NOTE — Progress Notes (Signed)
Subjective: 1 Day Post-Op Procedure(s) (LRB): TOTAL KNEE REVISION (POLY EXCHANGE) WASH OUT (Right) Patient reports pain as 8 on 0-10 scale.   Patient is well, and has had no acute complaints or problems Plan is to go Home after hospital stay. Negative for chest pain and shortness of breath Fever: No recent fevers Gastrointestinal:Negative for nausea and vomiting  Objective: Vital signs in last 24 hours: Temp:  [97.4 F (36.3 C)-98.3 F (36.8 C)] 98 F (36.7 C) (08/27 0918) Pulse Rate:  [68-91] 71 (08/27 0918) Resp:  [16-18] 18 (08/27 0918) BP: (102-133)/(65-74) 133/74 (08/27 0918) SpO2:  [99 %-100 %] 100 % (08/27 0918)  Intake/Output from previous day:  Intake/Output Summary (Last 24 hours) at 06/02/2023 1714 Last data filed at 06/02/2023 1551 Gross per 24 hour  Intake 2560 ml  Output 1525 ml  Net 1035 ml    Intake/Output this shift: Total I/O In: 2440 [P.O.:840; IV Piggyback:1600] Out: 1050 [Urine:1050]  Labs: Recent Labs    06/01/23 0844 06/02/23 0509  HGB 13.9 12.3*   Recent Labs    06/01/23 0844 06/02/23 0509  WBC 10.6* 15.1*  RBC 4.40 3.90*  HCT 41.2 36.8*  PLT 290 281   Recent Labs    06/01/23 0415 06/02/23 0509  NA  --  133*  K  --  3.9  CL  --  101  CO2  --  24  BUN  --  21  CREATININE 0.83 0.79  GLUCOSE  --  129*  CALCIUM  --  8.7*   No results for input(s): "LABPT", "INR" in the last 72 hours.   EXAM General - Patient is Alert, Appropriate, and Oriented Extremity - ABD soft Neurovascular intact Dorsiflexion/Plantar flexion intact Incision: scant drainage No cellulitis present Compartment soft Dressing/Incision - Mild bloody drainage noted to the right knee honeycomb.  Mild bloody drainage into the hemovac today, no purulence is seen. Motor Function - intact, moving foot and toes well on exam.  Abdomen soft with intact bowel sounds  Past Medical History:  Diagnosis Date   Anxiety    Aortic ectasia (HCC)    Asthma     Barrett's esophagus    BPH (benign prostatic hyperplasia)    COPD (chronic obstructive pulmonary disease) (HCC)    DDD (degenerative disc disease), lumbar    GERD (gastroesophageal reflux disease)    Headache    Hemorrhoids    Hyperlipidemia    Hyperlipidemia    Hypertension    Insomnia    Mild episode of recurrent major depressive disorder (HCC)    Neuropathy    Neuropathy, peripheral, idiopathic    Osteoarthritis    Primary osteoarthritis of right knee    Smoker    Tobacco use    Umbilical hernia    Vitamin D deficiency     Assessment/Plan: 1 Day Post-Op Procedure(s) (LRB): TOTAL KNEE REVISION (POLY EXCHANGE) WASH OUT (Right) Principal Problem:   Severe sepsis with acute organ dysfunction (HCC) Active Problems:   Benign prostatic hyperplasia   Chronic insomnia   COPD (chronic obstructive pulmonary disease) (HCC)   Hyperlipidemia, unspecified   Status post total knee replacement using cement, right   Leukocytosis   Elevated troponin   Depression  Estimated body mass index is 24.25 kg/m as calculated from the following:   Height as of this encounter: 5\' 9"  (1.753 m).   Weight as of this encounter: 74.5 kg. Advance diet Up with therapy D/C IV fluids when tolerating po intake.  Labs reviewed this AM, WBC  15.1 this morning. Initial culture negative for growth at this time, no organisms seen on gram stain. New cultures obtained yesterday during procedure.  Gram stain from yesterday showing gram - rods. Will plan on removing Hemovac tomorrow morning. ID has been consulted.  Continue IV Cefepime and Vancomycin. Up with therapy as tolerated, walked over 150 feet with therapy.  DVT Prophylaxis - TED hose and Eliquis Weight-Bearing as tolerated to right leg  J. Horris Latino, PA-C [facility] Orthopaedic Surgery 06/02/2023, 5:14 PM

## 2023-06-02 NOTE — Progress Notes (Signed)
Pharmacy Antibiotic Note  Anthony Bender is a 67 y.o. male admitted on 05/28/2023 with sepsis. PMH significant for COPD and tobacco use. In ED, patient is afebrile with WBC 18.6. Pharmacy has been consulted for vancomycin and cefepime dosing.  Day 5 of antibiotics. Renal function stable and consistent with baseline. Vancomycin level returned at 11 which is within the trough target range but the trough was drawn slightly early, two hours before next scheduled dose.  Plan: Increase vancomycin dose to 1250 mg IV q12H Continue cefepime 2 g IV q8H Continue to monitor renal function daily to assess for necessary antibiotic dose adjustments Continue to monitor culture results for opportunities to narrow therapy  Height: 5\' 9"  (175.3 cm) Weight: 74.5 kg (164 lb 3.9 oz) IBW/kg (Calculated) : 70.7  Temp (24hrs), Avg:98.2 F (36.8 C), Min:97.4 F (36.3 C), Max:98.9 F (37.2 C)  Recent Labs  Lab 05/28/23 1351 05/28/23 1500 05/28/23 2308 05/29/23 0538 06/01/23 0415 06/01/23 0844 06/02/23 0509  WBC 18.6*  --   --  13.9*  --  10.6* 15.1*  CREATININE 0.96  --   --  0.74 0.83  --  0.79  LATICACIDVEN 2.6* 2.2* 1.3  --   --   --   --   VANCOTROUGH  --   --   --   --   --   --  11*    Estimated Creatinine Clearance: 88.4 mL/min (by C-G formula based on SCr of 0.79 mg/dL).    Allergies  Allergen Reactions   Yellow Jacket Venom [Bee Venom] Anaphylaxis and Shortness Of Breath   Codeine Itching   Metaxalone Swelling   Venlafaxine Other (See Comments)    Loss of appetite, jittery   Zyban [Bupropion] Other (See Comments)    hallucinations   Fluoxetine Anxiety    anger   Antimicrobials this admission: vancomycin 8/22 >>  cefepime 8/22 >>   Dose adjustments this admission: vancomycin 1000 mg >> 1250 mg IV q12H  Microbiology results: 8/22 BCx: NGF 8/23 R knee aspirate: NGF 8/26 Tissue cx: NG<24h 8/26 Wound cx: NG<24h  Thank you for involving pharmacy in this patient's care.   Will  M. Dareen Piano, PharmD Clinical Pharmacist 06/02/2023 3:46 PM

## 2023-06-02 NOTE — Plan of Care (Signed)

## 2023-06-02 NOTE — Evaluation (Addendum)
Physical Therapy Evaluation Patient Details Name: Anthony Bender MRN: [id number] DOB: 14-Feb-1956 Today's Date: 06/02/2023  History of Present Illness  Pt is a 67 y/o male presenting due to initial complaints of vomiting, abdominal pain, and generalized weakness. He is now admitted for severe sepsis with acute organ dysfunction and s/p formal irrigation and debridement with polyethylene exchange of his right knee for possible septic arthritis status post his right total knee arthroplasty (8/26). PMH includes R TKA (05/19/23), HLD, HTN, insomnia, depression, anxiety, and COPD.  Clinical Impression   Pt presents laying in bed, 8/10 pain in R knee. He currently lives with his wife in a 2 story home (bed/bathroom access on first floor) with 1+1 stairs and no rails. PTA he was independent with ADLs and progressing from RW> cane for mobility.   Pt able to perform bed mobility modI, requiring increased time/effort and LLE assisting RLE to achieve sitting. Pt performed sit<>stand and ambulated ~169ft with CGA/minA and RW. PT noted increased pain with transition from sit<>stand and required a standing break for pain to subside before ambulating. He toileted at the end of session and was able to perform hygiene independently. He would benefit from continued skilled therapy to maximize functional abilities.       If plan is discharge home, recommend the following: A little help with walking and/or transfers;A little help with bathing/dressing/bathroom;Help with stairs or ramp for entrance;Assist for transportation;Assistance with cooking/housework;Direct supervision/assist for medications management   Can travel by private vehicle        Equipment Recommendations None recommended by PT  Recommendations for Other Services       Functional Status Assessment Patient has had a recent decline in their functional status and demonstrates the ability to make significant improvements in function in a reasonable  and predictable amount of time.     Precautions / Restrictions Precautions Precautions: Knee;Fall Restrictions Weight Bearing Restrictions: Yes RLE Weight Bearing: Weight bearing as tolerated      Mobility  Bed Mobility Overal bed mobility: Modified Independent             General bed mobility comments: increased time/effort to perform supine>sit, utilized LLE to assist RLE for bed mobility    Transfers Overall transfer level: Needs assistance Equipment used: Rolling walker (2 wheels) Transfers: Sit to/from Stand Sit to Stand: Contact guard assist, Min assist           General transfer comment: increase in pain with transition from sit<>stand, required a standing break before ambulating to allow pain to subside    Ambulation/Gait Ambulation/Gait assistance: Contact guard assist, Min assist Gait Distance (Feet): 150 Feet Assistive device: Rolling walker (2 wheels) Gait Pattern/deviations: Decreased step length - left, Decreased step length - right, Decreased stance time - right, Decreased weight shift to right Gait velocity: decreased     General Gait Details: pt limited by pain throughout ambulation  Stairs            Wheelchair Mobility     Tilt Bed    Modified Rankin (Stroke Patients Only)       Balance Overall balance assessment: Needs assistance Sitting-balance support: Feet supported, No upper extremity supported Sitting balance-Leahy Scale: Good     Standing balance support: During functional activity, Bilateral upper extremity supported, Reliant on assistive device for balance Standing balance-Leahy Scale: Fair  Pertinent Vitals/Pain Pain Assessment Pain Assessment: 0-10 Pain Score: 8  Pain Location: R knee Pain Descriptors / Indicators: Discomfort, Constant, Grimacing Pain Intervention(s): Monitored during session, Limited activity within patient's tolerance    Home Living  Family/patient expects to be discharged to:: Private residence Living Arrangements: Spouse/significant other Available Help at Discharge: Available 24 hours/day Type of Home: House Home Access: Stairs to enter Entrance Stairs-Rails: None Entrance Stairs-Number of Steps: 2 steps with landing between, uses RW   Home Layout: Able to live on main level with bedroom/bathroom;Two level Home Equipment: Agricultural consultant (2 wheels);Shower seat - built in;BSC/3in1;Cane - single point      Prior Function Prior Level of Function : Independent/Modified Independent;Working/employed;History of Falls (last six months)             Mobility Comments: Was beginning to progress from RW> cane for mobiltiy ADLs Comments: Independent     Extremity/Trunk Assessment   Upper Extremity Assessment Upper Extremity Assessment: Overall WFL for tasks assessed    Lower Extremity Assessment Lower Extremity Assessment: RLE deficits/detail RLE Deficits / Details: decreased functional mobility/strength due to previous TKA and recent procedure RLE: Unable to fully assess due to pain       Communication   Communication Communication: No apparent difficulties  Cognition Arousal: Alert Behavior During Therapy: WFL for tasks assessed/performed Overall Cognitive Status: Within Functional Limits for tasks assessed                                          General Comments      Exercises     Assessment/Plan    PT Assessment Patient needs continued PT services  PT Problem List Decreased strength;Decreased activity tolerance;Decreased balance;Decreased mobility;Decreased knowledge of use of DME;Decreased knowledge of precautions;Decreased safety awareness;Decreased range of motion       PT Treatment Interventions DME instruction;Balance training;Gait training;Stair training;Functional mobility training;Therapeutic activities;Therapeutic exercise    PT Goals (Current goals can be found in  the Care Plan section)  Acute Rehab PT Goals Patient Stated Goal: return home PT Goal Formulation: With patient Time For Goal Achievement: 06/16/23 Potential to Achieve Goals: Good    Frequency 7X/week     Co-evaluation               AM-PAC PT "6 Clicks" Mobility  Outcome Measure Help needed turning from your back to your side while in a flat bed without using bedrails?: None Help needed moving from lying on your back to sitting on the side of a flat bed without using bedrails?: None Help needed moving to and from a bed to a chair (including a wheelchair)?: A Little Help needed standing up from a chair using your arms (e.g., wheelchair or bedside chair)?: A Little Help needed to walk in hospital room?: A Little Help needed climbing 3-5 steps with a railing? : A Lot 6 Click Score: 19    End of Session Equipment Utilized During Treatment: Gait belt Activity Tolerance: Patient tolerated treatment well Patient left: in bed;with call bell/phone within reach;with bed alarm set;with nursing/sitter in room   PT Visit Diagnosis: Other abnormalities of gait and mobility (R26.89);Muscle weakness (generalized) (M62.81);Difficulty in walking, not elsewhere classified (R26.2) Pain - Right/Left: Right Pain - part of body: Knee    Time: 8657-8469 PT Time Calculation (min) (ACUTE ONLY): 31 min   Charges:   PT Evaluation $PT Eval Low Complexity: 1 Low  PT Treatments $Therapeutic Activity: 23-37 mins PT General Charges $$ ACUTE PT VISIT: 1 Visit        Lavonya Hoerner, PT, SPT 4:26 PM,06/02/23

## 2023-06-02 NOTE — Consult Note (Signed)
NAME: Anthony Bender  DOB: [birth year]  MRN: [id number]  Date/Time: 06/02/2023 5:23 PM  REQUESTING PROVIDER: Dr.Poggi Subjective:  REASON FOR CONSULT: PJI ? Anthony Bender is a 67 y.o. with a history of HTN ,RLS, anxiety, depression underwent RT TKA on 05/19/23, presented to ED on 05/28/23 with chills , weakness and muscle aches of 3 days duration.pt says he had TKA on 8/13 and was doing well till a week ago. Developed chills and  sweats In the ED vitals were  05/28/23  BP 127/85  Temp 97.8 F (36.6 C)  Pulse Rate 86  Resp 20  SpO2 99 %   Labs revealed  Latest Reference Range & Units 05/28/23  WBC 4.0 - 10.5 K/uL 18.6 (H)  Hemoglobin 13.0 - 17.0 g/dL 82.9  HCT 56.2 - 13.0 % 50.1  Platelets 150 - 400 K/uL 454 (H)  Creatinine 0.61 - 1.24 mg/dL 8.65  Blood culture was sent. Pt has increasing swelling and discomfort rt knee  He had knee aspiration by Ortho on 05/29/23 and 20cc of bloody fluid drained Cell count was 11,613 with 89% neutrophils.  Culture was negative He was taken for surgery and underwent a debridement and poly exchange on 06/01/2023.  Culture from surgical tissue sent.  .  Gram stain has gram-negative rod but no WBC Asked to see the patient for antibiotic management He is currently on vancomycin cefepime and Flagyl. Past Medical History:  Diagnosis Date   Anxiety    Aortic ectasia (HCC)    Asthma    Barrett's esophagus    BPH (benign prostatic hyperplasia)    COPD (chronic obstructive pulmonary disease) (HCC)    DDD (degenerative disc disease), lumbar    GERD (gastroesophageal reflux disease)    Headache    Hemorrhoids    Hyperlipidemia    Hyperlipidemia    Hypertension    Insomnia    Mild episode of recurrent major depressive disorder (HCC)    Neuropathy    Neuropathy, peripheral, idiopathic    Osteoarthritis    Primary osteoarthritis of right knee    Smoker    Tobacco use    Umbilical hernia    Vitamin D deficiency     Past Surgical History:   Procedure Laterality Date   AMPUTATION TOE Left 09/06/2022   Procedure: AMPUTATION TOE;  Surgeon: Gwyneth Revels, DPM;  Location: [facility] ORS;  Service: Podiatry;  Laterality: Left;   APPENDECTOMY     BACK SURGERY     injection   ESOPHAGOGASTRODUODENOSCOPY N/A 03/15/2015   Procedure: ESOPHAGOGASTRODUODENOSCOPY (EGD);  Surgeon: Wallace Cullens, MD;  Location: [facility] ENDOSCOPY;  Service: Gastroenterology;  Laterality: N/A;   ESOPHAGOGASTRODUODENOSCOPY (EGD) WITH PROPOFOL N/A 08/23/2015   Procedure: ESOPHAGOGASTRODUODENOSCOPY (EGD) WITH PROPOFOL;  Surgeon: Wallace Cullens, MD;  Location: [facility] ENDOSCOPY;  Service: Gastroenterology;  Laterality: N/A;   Barrx Procedure   ESOPHAGOGASTRODUODENOSCOPY (EGD) WITH PROPOFOL N/A 01/24/2016   Procedure: ESOPHAGOGASTRODUODENOSCOPY (EGD) WITH PROPOFOL;  Surgeon: Wallace Cullens, MD;  Location: [facility] ENDOSCOPY;  Service: Gastroenterology;  Laterality: N/A;   ESOPHAGOGASTRODUODENOSCOPY (EGD) WITH PROPOFOL N/A 12/03/2022   Procedure: ESOPHAGOGASTRODUODENOSCOPY (EGD) WITH PROPOFOL;  Surgeon: Wyline Mood, MD;  Location: [facility]COPY;  Service: Gastroenterology;  Laterality: N/A;   HERNIA REPAIR     JOINT REPLACEMENT Left    Partial Knee Replacement   KNEE ARTHROSCOPY     PAROTIDECTOMY Left 06/30/2017   Procedure: PAROTIDECTOMY;  Surgeon: Linus Salmons, MD;  Location: [facility] ORS;  Service: ENT;  Laterality: Left;   reattachment of finger  Left    little finger   salvia gland tumors removed     SUPERFICIAL PERONEAL NERVE RELEASE     TOTAL KNEE ARTHROPLASTY Right 05/19/2023   Procedure: TOTAL KNEE ARTHROPLASTY;  Surgeon: Christena Flake, MD;  Location: [facility] ORS;  Service: Orthopedics;  Laterality: Right;   TOTAL KNEE REVISION Right 06/01/2023   Procedure: TOTAL KNEE REVISION (POLY EXCHANGE) WASH OUT;  Surgeon: Christena Flake, MD;  Location: [facility] ORS;  Service: Orthopedics;  Laterality: Right;    Social History   Socioeconomic History   Marital status: Married    Spouse name: kathie    Number of children: 3   Years of education: Not on file   Highest education level: Some college, no degree  Occupational History    Comment: full time  Tobacco Use   Smoking status: Every Day    Current packs/day: 1.50    Average packs/day: 1.5 packs/day for 59.0 years (88.5 ttl pk-yrs)    Types: Cigarettes   Smokeless tobacco: Never  Vaping Use   Vaping status: Never Used  Substance and Sexual Activity   Alcohol use: Yes   Drug use: Yes    Types: Marijuana    Comment: CBD   Sexual activity: Not Currently  Other Topics Concern   Not on file  Social History Narrative   Married and lives at home with wife.    Social Determinants of Health   Financial Resource Strain: Medium Risk (11/28/2022)   Received [facility], [facility]   Overall Financial Resource Strain (CARDIA)    Difficulty of Paying Living Expenses: Somewhat hard  Food Insecurity: No Food Insecurity (05/28/2023)   Hunger Vital Sign    Worried About Running Out of Food in the Last Year: Never true    Ran Out of Food in the Last Year: Never true  Transportation Needs: No Transportation Needs (05/28/2023)   PRAPARE - Administrator, Civil Service (Medical): No    Lack of Transportation (Non-Medical): No  Physical Activity: Inactive (01/12/2018)   Exercise Vital Sign    Days of Exercise per Week: 0 days    Minutes of Exercise per Session: 0 min  Stress: Stress Concern Present (01/12/2018)   [employer] of Occupational Health - Occupational Stress Questionnaire    Feeling of Stress : Rather much  Social Connections: Moderately Isolated (01/12/2018)   Social Connection and Isolation Panel [NHANES]    Frequency of Communication with Friends and Family: Twice a week    Frequency of Social Gatherings with Friends and Family: Never    Attends Religious Services: Never    Database administrator or Organizations: No    Attends Banker Meetings: Never     Marital Status: Married  Catering manager Violence: Not At Risk (05/28/2023)   Humiliation, Afraid, Rape, and Kick questionnaire    Fear of Current or Ex-Partner: No    Emotionally Abused: No    Physically Abused: No    Sexually Abused: No    Family History  Problem Relation Age of Onset   Diabetes Mother    Hypertension Mother    Hypertension Father    Allergies  Allergen Reactions   Yellow Jacket Venom [Bee Venom] Anaphylaxis and Shortness Of Breath   Codeine Itching   Metaxalone Swelling   Venlafaxine Other (See Comments)    Loss of appetite, jittery   Zyban [Bupropion] Other (See Comments)    hallucinations   Fluoxetine Anxiety  anger   I? Current Facility-Administered Medications  Medication Dose Route Frequency Provider Last Rate Last Admin   0.9 %  sodium chloride infusion   Intravenous Continuous Poggi, Excell Seltzer, MD 75 mL/hr at 06/01/23 1810 New Bag at 06/01/23 1810   acetaminophen (TYLENOL) tablet 325-650 mg  325-650 mg Oral Q6H PRN Poggi, Excell Seltzer, MD       albuterol (PROVENTIL) (2.5 MG/3ML) 0.083% nebulizer solution 3 mL  3 mL Inhalation Q6H PRN Poggi, Excell Seltzer, MD       alum & mag hydroxide-simeth (MAALOX/MYLANTA) 200-200-20 MG/5ML suspension 30 mL  30 mL Oral Q4H PRN Poggi, Excell Seltzer, MD   30 mL at 05/29/23 1633   apixaban (ELIQUIS) tablet 2.5 mg  2.5 mg Oral BID Poggi, Excell Seltzer, MD   2.5 mg at 06/02/23 6045   bisacodyl (DULCOLAX) suppository 10 mg  10 mg Rectal Daily PRN Poggi, Excell Seltzer, MD       ceFEPIme (MAXIPIME) 2 g in sodium chloride 0.9 % 100 mL IVPB  2 g Intravenous Q8H Poggi, Excell Seltzer, MD 200 mL/hr at 06/02/23 1451 2 g at 06/02/23 1451   diphenhydrAMINE (BENADRYL) 12.5 MG/5ML elixir 12.5-25 mg  12.5-25 mg Oral Q4H PRN Poggi, Excell Seltzer, MD       docusate sodium (COLACE) capsule 100 mg  100 mg Oral BID Poggi, Excell Seltzer, MD   100 mg at 06/02/23 0851   DULoxetine (CYMBALTA) DR capsule 60 mg  60 mg Oral Daily Poggi, Excell Seltzer, MD   60 mg at 06/02/23 0851   fluticasone (FLONASE) 50  MCG/ACT nasal spray 2 spray  2 spray Each Nare BID PRN Poggi, Excell Seltzer, MD       fluticasone furoate-vilanterol (BREO ELLIPTA) 100-25 MCG/ACT 1 puff  1 puff Inhalation Daily Poggi, Excell Seltzer, MD   1 puff at 06/02/23 1148   And   umeclidinium bromide (INCRUSE ELLIPTA) 62.5 MCG/ACT 1 puff  1 puff Inhalation Daily Poggi, Excell Seltzer, MD   1 puff at 06/02/23 1148   ketorolac (TORADOL) 15 MG/ML injection 7.5 mg  7.5 mg Intravenous Q6H Poggi, Excell Seltzer, MD   7.5 mg at 06/02/23 1445   magnesium hydroxide (MILK OF MAGNESIA) suspension 30 mL  30 mL Oral Daily PRN Poggi, Excell Seltzer, MD       metoCLOPramide (REGLAN) tablet 5-10 mg  5-10 mg Oral Q8H PRN Poggi, Excell Seltzer, MD       Or   metoCLOPramide (REGLAN) injection 5-10 mg  5-10 mg Intravenous Q8H PRN Poggi, Excell Seltzer, MD       morphine (PF) 2 MG/ML injection 2-4 mg  2-4 mg Intravenous Q2H PRN Poggi, Excell Seltzer, MD   2 mg at 06/02/23 1558   nortriptyline (PAMELOR) capsule 20 mg  20 mg Oral QHS Poggi, Excell Seltzer, MD   20 mg at 06/01/23 2214   ondansetron (ZOFRAN) tablet 4 mg  4 mg Oral Q6H PRN Poggi, Excell Seltzer, MD       Or   ondansetron (ZOFRAN) injection 4 mg  4 mg Intravenous Q6H PRN Poggi, Excell Seltzer, MD       oxyCODONE (Oxy IR/ROXICODONE) immediate release tablet 5-10 mg  5-10 mg Oral Q4H PRN Poggi, Excell Seltzer, MD   10 mg at 05/31/23 2030   pantoprazole (PROTONIX) EC tablet 40 mg  40 mg Oral BID AC Poggi, Excell Seltzer, MD   40 mg at 06/02/23 1558   polyethylene glycol (MIRALAX / GLYCOLAX) packet 17 g  17 g  Oral Daily Poggi, Excell Seltzer, MD   17 g at 06/02/23 1610   pregabalin (LYRICA) capsule 150 mg  150 mg Oral TID Christena Flake, MD   150 mg at 06/02/23 1558   rOPINIRole (REQUIP) tablet 0.5 mg  0.5 mg Oral QID PRN Poggi, Excell Seltzer, MD       rosuvastatin (CRESTOR) tablet 10 mg  10 mg Oral QHS Poggi, Excell Seltzer, MD   10 mg at 06/01/23 2214   senna-docusate (Senokot-S) tablet 1 tablet  1 tablet Oral BID Christena Flake, MD   1 tablet at 06/02/23 9604   sodium phosphate (FLEET) enema 1 enema  1 enema Rectal Once  PRN Poggi, Excell Seltzer, MD       vancomycin (VANCOREADY) IVPB 1250 mg/250 mL  1,250 mg Intravenous Q12H Meegan, Eryn, RPH       zolpidem (AMBIEN) tablet 5 mg  5 mg Oral QHS Poggi, Excell Seltzer, MD   5 mg at 06/01/23 2215     Abtx:  Anti-infectives (From admission, onward)    Start     Dose/Rate Route Frequency Ordered Stop   06/02/23 1800  vancomycin (VANCOREADY) IVPB 1250 mg/250 mL        1,250 mg 166.7 mL/hr over 90 Minutes Intravenous Every 12 hours 06/02/23 1538     06/01/23 1425  vancomycin (VANCOCIN) powder  Status:  Discontinued          As needed 06/01/23 1426 06/01/23 1535   05/29/23 0600  vancomycin (VANCOCIN) IVPB 1000 mg/200 mL premix  Status:  Discontinued        1,000 mg 200 mL/hr over 60 Minutes Intravenous Every 12 hours 05/28/23 1914 06/02/23 1538   05/28/23 2300  ceFEPIme (MAXIPIME) 2 g in sodium chloride 0.9 % 100 mL IVPB        2 g 200 mL/hr over 30 Minutes Intravenous Every 8 hours 05/28/23 1914     05/28/23 2000  metroNIDAZOLE (FLAGYL) IVPB 500 mg  Status:  Discontinued        500 mg 100 mL/hr over 60 Minutes Intravenous Every 12 hours 05/28/23 1848 06/02/23 1501   05/28/23 1915  vancomycin (VANCOREADY) IVPB 750 mg/150 mL        750 mg 150 mL/hr over 60 Minutes Intravenous  Once 05/28/23 1909 05/28/23 2130   05/28/23 1515  vancomycin (VANCOCIN) IVPB 1000 mg/200 mL premix        1,000 mg 200 mL/hr over 60 Minutes Intravenous  Once 05/28/23 1507 05/28/23 1835   05/28/23 1515  ceFEPIme (MAXIPIME) 2 g in sodium chloride 0.9 % 100 mL IVPB        2 g 200 mL/hr over 30 Minutes Intravenous  Once 05/28/23 1507 05/28/23 1721       REVIEW OF SYSTEMS:  Const: negative fever, negative chills, negative weight loss Eyes: negative diplopia or visual changes, negative eye pain ENT: negative coryza, negative sore throat Resp: negative cough, hemoptysis, dyspnea Cards: negative for chest pain, palpitations, lower extremity edema GU: negative for frequency, dysuria and hematuria GI:  Negative for abdominal pain, diarrhea, bleeding, constipation Skin: negative for rash and pruritus Heme: negative for easy bruising and gum/nose bleeding MS:rt knee surgical wrap Picture reviewed  Neurolo:negative for headaches, dizziness, vertigo, memory problems  Psych: negative for feelings of anxiety, depression  Endocrine: negative for thyroid, diabetes Allergy/Immunology- negative for any medication or food allergies ? Pertinent Positives include : Objective:  VITALS:  BP 133/74 (BP Location: Right Arm)   Pulse 71  Temp 98 F (36.7 C)   Resp 18   Ht 5\' 9"  (1.753 m)   Wt 74.5 kg   SpO2 100%   BMI 24.25 kg/m   PHYSICAL EXAM:  General: Alert, cooperative, no distress, appears stated age.  Head: Normocephalic, without obvious abnormality, atraumatic. Eyes: Conjunctivae clear, anicteric sclerae. Pupils are equal ENT Nares normal. No drainage or sinus tenderness. Lips, mucosa, and tongue normal. No Thrush Neck: Supple, symmetrical, no adenopathy, thyroid: non tender no carotid bruit and no JVD. Back: No CVA tenderness. Lungs: Clear to auscultation bilaterally. No Wheezing or Rhonchi. No rales. Heart: Regular rate and rhythm, no murmur, rub or gallop. Abdomen: Soft, non-tender,not distended. Bowel sounds normal. No masses Extremities: atraumatic, no cyanosis. No edema. No clubbing Skin: No rashes or lesions. Or bruising Lymph: Cervical, supraclavicular normal. Neurologic: Grossly non-focal Pertinent Labs Lab Results CBC    Component Value Date/Time   WBC 15.1 (H) 06/02/2023 0509   RBC 3.90 (L) 06/02/2023 0509   HGB 12.3 (L) 06/02/2023 0509   HGB 15.2 04/27/2014 0841   HCT 36.8 (L) 06/02/2023 0509   HCT 45.0 04/27/2014 0841   PLT 281 06/02/2023 0509   PLT 177 04/27/2014 0841   MCV 94.4 06/02/2023 0509   MCV 102 (H) 04/27/2014 0841   MCH 31.5 06/02/2023 0509   MCHC 33.4 06/02/2023 0509   RDW 15.1 06/02/2023 0509   RDW 13.9 04/27/2014 0841   LYMPHSABS 1.7  06/01/2023 0844   LYMPHSABS 1.8 04/27/2014 0841   MONOABS 0.8 06/01/2023 0844   MONOABS 0.7 04/27/2014 0841   EOSABS 0.2 06/01/2023 0844   EOSABS 0.2 04/27/2014 0841   BASOSABS 0.1 06/01/2023 0844   BASOSABS 0.2 (H) 04/27/2014 0841       Latest Ref Rng & Units 06/02/2023    5:09 AM 06/01/2023    4:15 AM 05/29/2023    5:38 AM  CMP  Glucose 70 - 99 mg/dL 161   096   BUN 8 - 23 mg/dL 21   19   Creatinine 0.45 - 1.24 mg/dL 4.09  8.11  9.14   Sodium 135 - 145 mmol/L 133   135   Potassium 3.5 - 5.1 mmol/L 3.9   3.8   Chloride 98 - 111 mmol/L 101   107   CO2 22 - 32 mmol/L 24   23   Calcium 8.9 - 10.3 mg/dL 8.7   9.2   Total Protein 6.5 - 8.1 g/dL   6.5   Total Bilirubin 0.3 - 1.2 mg/dL   1.3   Alkaline Phos 38 - 126 U/L   69   AST 15 - 41 U/L   20   ALT 0 - 44 U/L   18       Microbiology: Recent Results (from the past 240 hour(s))  SARS Coronavirus 2 by RT PCR (hospital order, performed in [facility] hospital lab) *cepheid single result test* Anterior Nasal Swab     Status: None   Collection Time: 05/28/23  1:51 PM   Specimen: Anterior Nasal Swab  Result Value Ref Range Status   SARS Coronavirus 2 by RT PCR NEGATIVE NEGATIVE Final    Comment: (NOTE) SARS-CoV-2 target nucleic acids are NOT DETECTED.  The SARS-CoV-2 RNA is generally detectable in upper and lower respiratory specimens during the acute phase of infection. The lowest concentration of SARS-CoV-2 viral copies this assay can detect is 250 copies / mL. A negative result does not preclude SARS-CoV-2 infection and should not be used as the  sole basis for treatment or other patient management decisions.  A negative result may occur with improper specimen collection / handling, submission of specimen other than nasopharyngeal swab, presence of viral mutation(s) within the areas targeted by this assay, and inadequate number of viral copies (<250 copies / mL). A negative result must be combined with  clinical observations, patient history, and epidemiological information.  Fact Sheet for Patients:   RoadLapTop.co.za  Fact Sheet for Healthcare Providers: http://kim-miller.com/  This test is not yet approved or  cleared by the [place] FDA and has been authorized for detection and/or diagnosis of SARS-CoV-2 by FDA under an Emergency Use Authorization (EUA).  This EUA will remain in effect (meaning this test can be used) for the duration of the COVID-19 declaration under Section 564(b)(1) of the Act, 21 U.S.C. section 360bbb-3(b)(1), unless the authorization is terminated or revoked sooner.  Performed at [facility], [address] [place], [place] [address]   Resp panel by RT-PCR (RSV, Flu A&B, Covid) Anterior Nasal Swab     Status: None   Collection Time: 05/28/23  5:18 PM   Specimen: Anterior Nasal Swab  Result Value Ref Range Status   SARS Coronavirus 2 by RT PCR NEGATIVE NEGATIVE Final    Comment: (NOTE) SARS-CoV-2 target nucleic acids are NOT DETECTED.  The SARS-CoV-2 RNA is generally detectable in upper respiratory specimens during the acute phase of infection. The lowest concentration of SARS-CoV-2 viral copies this assay can detect is 138 copies/mL. A negative result does not preclude SARS-Cov-2 infection and should not be used as the sole basis for treatment or other patient management decisions. A negative result may occur with  improper specimen collection/handling, submission of specimen other than nasopharyngeal swab, presence of viral mutation(s) within the areas targeted by this assay, and inadequate number of viral copies(<138 copies/mL). A negative result must be combined with clinical observations, patient history, and epidemiological information. The expected result is Negative.  Fact Sheet for Patients:  BloggerCourse.com  Fact Sheet for Healthcare Providers:   SeriousBroker.it  This test is no t yet approved or cleared by the [place] FDA and  has been authorized for detection and/or diagnosis of SARS-CoV-2 by FDA under an Emergency Use Authorization (EUA). This EUA will remain  in effect (meaning this test can be used) for the duration of the COVID-19 declaration under Section 564(b)(1) of the Act, 21 U.S.C.section 360bbb-3(b)(1), unless the authorization is terminated  or revoked sooner.       Influenza A by PCR NEGATIVE NEGATIVE Final   Influenza B by PCR NEGATIVE NEGATIVE Final    Comment: (NOTE) The Xpert Xpress SARS-CoV-2/FLU/RSV plus assay is intended as an aid in the diagnosis of influenza from Nasopharyngeal swab specimens and should not be used as a sole basis for treatment. Nasal washings and aspirates are unacceptable for Xpert Xpress SARS-CoV-2/FLU/RSV testing.  Fact Sheet for Patients: BloggerCourse.com  Fact Sheet for Healthcare Providers: SeriousBroker.it  This test is not yet approved or cleared by the [place] FDA and has been authorized for detection and/or diagnosis of SARS-CoV-2 by FDA under an Emergency Use Authorization (EUA). This EUA will remain in effect (meaning this test can be used) for the duration of the COVID-19 declaration under Section 564(b)(1) of the Act, 21 U.S.C. section 360bbb-3(b)(1), unless the authorization is terminated or revoked.     Resp Syncytial Virus by PCR NEGATIVE NEGATIVE Final    Comment: (NOTE) Fact Sheet for Patients: BloggerCourse.com  Fact Sheet for Healthcare Providers:  SeriousBroker.it  This test is not yet approved or cleared by the [place] and has been authorized for detection and/or diagnosis of SARS-CoV-2 by FDA under an Emergency Use Authorization (EUA). This EUA will remain in effect (meaning this test can be used) for  the duration of the COVID-19 declaration under Section 564(b)(1) of the Act, 21 U.S.C. section 360bbb-3(b)(1), unless the authorization is terminated or revoked.  Performed at [facility], [address] [place], [place] [address]   Culture, blood (Routine X 2) w Reflex to ID Panel     Status: None   Collection Time: 05/28/23  7:45 PM   Specimen: BLOOD  Result Value Ref Range Status   Specimen Description BLOOD  Final   Special Requests   Final    BOTTLES DRAWN AEROBIC AND ANAEROBIC Blood Culture results may not be optimal due to an inadequate volume of blood received in culture bottles   Culture   Final    NO GROWTH 5 DAYS Performed at [facility], [address] [place], [place] [address]    Report Status 06/02/2023 FINAL  Final  Culture, blood (Routine X 2) w Reflex to ID Panel     Status: None   Collection Time: 05/28/23  7:48 PM   Specimen: BLOOD  Result Value Ref Range Status   Specimen Description BLOOD  Final   Special Requests   Final    BOTTLES DRAWN AEROBIC AND ANAEROBIC Blood Culture results may not be optimal due to an inadequate volume of blood received in culture bottles   Culture   Final    NO GROWTH 5 DAYS Performed at [facility], [address], [place], [place] [address]    Report Status 06/02/2023 FINAL  Final  MRSA Next Gen by PCR, Nasal     Status: None   Collection Time: 05/28/23 11:17 PM   Specimen: Nasal Mucosa; Nasal Swab  Result Value Ref Range Status   MRSA by PCR Next Gen NOT DETECTED NOT DETECTED Final    Comment: (NOTE) The GeneXpert MRSA Assay (FDA approved for NASAL specimens only), is one component of a comprehensive MRSA colonization surveillance program. It is not intended to diagnose MRSA infection nor to guide or monitor treatment for MRSA infections. Test performance is not FDA approved in patients less than 20 years old. Performed at [facility], [address] [place], [place]  [address]   Body fluid culture w Gram Stain     Status: None   Collection Time: 05/29/23  8:08 AM   Specimen: Synovium; Body Fluid  Result Value Ref Range Status   Specimen Description   Final    SYNOVIAL Performed at [facility], [address] [place], [place] [address]    Special Requests   Final    RIGHT KNEE ASPIRATE Performed at [facility] [address] [place], [place] [address]    Gram Stain   Final    ABUNDANT WBC PRESENT,BOTH PMN AND MONONUCLEAR NO ORGANISMS SEEN    Culture   Final    NO GROWTH 3 DAYS Performed at [facility], [address] [place], [place] [address]    Report Status 06/01/2023 FINAL  Final  SARS Coronavirus 2 by RT PCR (hospital order, performed in [facility] hospital lab) *cepheid single result test* Anterior Nasal Swab     Status: None   Collection Time: 06/01/23  8:45 AM   Specimen: Anterior Nasal Swab  Result Value Ref  Range Status   SARS Coronavirus 2 by RT PCR NEGATIVE NEGATIVE Final    Comment: (NOTE) SARS-CoV-2 target nucleic acids are NOT DETECTED.  The SARS-CoV-2 RNA is generally detectable in upper and lower respiratory specimens during the acute phase of infection. The lowest concentration of SARS-CoV-2 viral copies this assay can detect is 250 copies / mL. A negative result does not preclude SARS-CoV-2 infection and should not be used as the sole basis for treatment or other patient management decisions.  A negative result may occur with improper specimen collection / handling, submission of specimen other than nasopharyngeal swab, presence of viral mutation(s) within the areas targeted by this assay, and inadequate number of viral copies (<250 copies / mL). A negative result must be combined with clinical observations, patient history, and epidemiological information.  Fact Sheet for Patients:   RoadLapTop.co.za  Fact Sheet for Healthcare  Providers: http://kim-miller.com/  This test is not yet approved or  cleared by the [place] FDA and has been authorized for detection and/or diagnosis of SARS-CoV-2 by FDA under an Emergency Use Authorization (EUA).  This EUA will remain in effect (meaning this test can be used) for the duration of the COVID-19 declaration under Section 564(b)(1) of the Act, 21 U.S.C. section 360bbb-3(b)(1), unless the authorization is terminated or revoked sooner.  Performed at [facility], [address] [place], [place] [address]   Aerobic/Anaerobic Culture w Gram Stain (surgical/deep wound)     Status: None (Preliminary result)   Collection Time: 06/01/23  2:14 PM   Specimen: Wound  Result Value Ref Range Status   Specimen Description   Final    WOUND RIGHT KNEE Performed at [facility], [address] [address] [place], [place] [address]    Special Requests   Final    NONE Performed at [facility], [address] [place], [place] [address]    Gram Stain NO WBC SEEN NO ORGANISMS SEEN   Final   Culture   Final    NO GROWTH < 24 HOURS Performed at [facility], [address], [place], [place] [address]    Report Status PENDING  Incomplete  Aerobic/Anaerobic Culture w Gram Stain (surgical/deep wound)     Status: None (Preliminary result)   Collection Time: 06/01/23  2:19 PM   Specimen: Path Tissue  Result Value Ref Range Status   Specimen Description   Final    TISSUE RIGHT KNEE Performed at [facility], [address], [place], [place] [address]    Special Requests   Final    NONE Performed at [facility], [address] [place], [place] [address]    Gram Stain NO WBC SEEN RARE GRAM NEGATIVE RODS   Final   Culture   Final    NO GROWTH < 24 HOURS Performed at [facility], [address], [place], [place] [address]    Report Status PENDING  Incomplete    IMAGING RESULTS: I have personally reviewed  the films ? Impression/Recommendation 67 year old male presenting with, chills and fatigue following total knee arthroplasty on the right 10 days ago.  Leukocytosis with no fever Aspirate of the right knee joint was not very significant for septic arthritis Washout was done and cultures were sent Gram-negative rod on the Gram stain but need to wait for the culture as most of the time that could be an error in reading  especially with no WBCs on the Gram stain.  Continue Vanco  and cefepime Stop metronidazole  -Anemia on rosuvastatin  Anxiety/depression on duloxetine, Nortriptyline and Ambien ? ? ___________________________________________________ Discussed with patient, requesting provider Note:  This document was prepared using Dragon voice recognition software and may include unintentional dictation errors.

## 2023-06-02 NOTE — Progress Notes (Signed)
PROGRESS NOTE    Anthony Bender  EAV:[id number] DOB: 1955-11-29 DOA: 05/28/2023 PCP: Kennis Carina, MD    Brief Narrative:  67 year old male with history of depression, anxiety, on anticoagulation with Eliquis, restless leg syndrome, hyperlipidemia, hypertension, insomnia, who presents emergency department for chief concerns of chills for 3 days and vomiting.  Subsequently developed chills and sweats the day prior to presentation.  Also reports loss of sense of taste.  He is 10 days status post right total knee arthroplasty.  Elevated lactic acid and white blood cell count on admission.  8/23: Status post bedside arthrocentesis with orthopedic surgery on 8/23 8/24: Abundant inflammatory cells noted on arthrocentesis culture data.  No organisms isolated at this time.  Patient feeling better 8/26: N.p.o. for OR with orthopedics today 8/27: Status post open irrigation and debridement right knee  Assessment & Plan:   Principal Problem:   Severe sepsis with acute organ dysfunction (HCC) Active Problems:   Status post total knee replacement using cement, right   Elevated troponin   Benign prostatic hyperplasia   Chronic insomnia   COPD (chronic obstructive pulmonary disease) (HCC)   Hyperlipidemia, unspecified   Leukocytosis   Depression   Severe sepsis with acute organ dysfunction (HCC) Potential source right knee.  Status post arthrocentesis bedside with orthopedic surgery.  White blood cell count is decreasing.  No fevers over interval.  Patient starting to feel better.  Cultures remain with no growth although abundant PMNs. -Status post return to the OR for I&D Plan: Continue vancomycin and cefepime DC Flagyl Follow surgical cultures for pathogen identification Multimodal pain control Can consider infectious disease consult if we have any culture data by which to tailor antibiotics   Elevated troponin Suspect secondary to severe sepsis Low suspicion for ACS No indication  for telemetry   Status post total knee replacement using cement, right On 05/19/2023 Orthopedics following Status post return to OR for I&D on 8/26   Depression Duloxetine 60 mg daily resumed   Leukocytosis Suspect secondary to sepsis Downtrending   Hyperlipidemia, unspecified Rosuvastatin 10 mg nightly resumed   COPD (chronic obstructive pulmonary disease) (HCC) Does not appear to be in acute exacerbation As needed bronchodilators   Chronic insomnia Zolpidem 5 mg nightly as needed for sleep  DVT prophylaxis: Eliquis Code Status: Full Family Communication: Spouse at bedside 8/23, 8/25 Disposition Plan: Status is: Inpatient Remains inpatient appropriate because: Sepsis on IV antibiotics   Level of care: Med-Surg  Consultants:  Orthopedics  Procedures:  Right knee arthrocentesis Right knee irrigation and debridement 8/26  Antimicrobials: Vancomycin Cefepime   Subjective: Seen and examined.  Pain this morning.  Received 2 morphine.  Sleepy on my evaluation.  Objective: Vitals:   06/01/23 1754 06/01/23 2006 06/02/23 0416 06/02/23 0918  BP: 102/68 115/65 103/67 133/74  Pulse: 91 75 68 71  Resp: 16   18  Temp: (!) 97.4 F (36.3 C) 98.3 F (36.8 C) 97.7 F (36.5 C) 98 F (36.7 C)  TempSrc: Oral Oral Oral   SpO2: 99% 100% 100% 100%  Weight:      Height:        Intake/Output Summary (Last 24 hours) at 06/02/2023 1056 Last data filed at 06/02/2023 1046 Gross per 24 hour  Intake 1280 ml  Output 940 ml  Net 340 ml    Filed Weights   05/28/23 1346 05/29/23 0402 06/01/23 1248  Weight: 83.9 kg 74.5 kg 74.5 kg    Examination:  General exam: No acute distress Respiratory  system: Lungs.  Normal work of breathing.  Room air Cardiovascular system: S1-S2, RRR, no murmurs, no pedal edema Gastrointestinal system: Soft,/ND, normal bowel sounds Central nervous system: Alert and oriented. No focal neurological deficits. Extremities: Right knee in surgical  dressings, not taken down Skin: No rashes, lesions or ulcers Psychiatry: Judgement and insight appear normal. Mood & affect appropriate.     Data Reviewed: I have personally reviewed following labs and imaging studies  CBC: Recent Labs  Lab 05/28/23 1351 05/29/23 0538 06/01/23 0844 06/02/23 0509  WBC 18.6* 13.9* 10.6* 15.1*  NEUTROABS  --   --  7.8*  --   HGB 16.9 13.6 13.9 12.3*  HCT 50.1 40.0 41.2 36.8*  MCV 92.6 93.9 93.6 94.4  PLT 454* 315 290 281   Basic Metabolic Panel: Recent Labs  Lab 05/28/23 1351 05/29/23 0538 06/01/23 0415 06/02/23 0509  NA 134* 135  --  133*  K 3.5 3.8  --  3.9  CL 104 107  --  101  CO2 20* 23  --  24  GLUCOSE 128* 127*  --  129*  BUN 23 19  --  21  CREATININE 0.96 0.74 0.83 0.79  CALCIUM 9.6 9.2  --  8.7*  MG  --  2.5*  --   --    GFR: Estimated Creatinine Clearance: 88.4 mL/min (by C-G formula based on SCr of 0.79 mg/dL). Liver Function Tests: Recent Labs  Lab 05/28/23 1351 05/29/23 0538  AST 29 20  ALT 22 18  ALKPHOS 97 69  BILITOT 1.8* 1.3*  PROT 8.3* 6.5  ALBUMIN 4.3 3.5   Recent Labs  Lab 05/28/23 1351  LIPASE 44   No results for input(s): "AMMONIA" in the last 168 hours. Coagulation Profile: Recent Labs  Lab 05/28/23 1351  INR 1.0   Cardiac Enzymes: No results for input(s): "CKTOTAL", "CKMB", "CKMBINDEX", "TROPONINI" in the last 168 hours. BNP (last 3 results) No results for input(s): "PROBNP" in the last 8760 hours. HbA1C: No results for input(s): "HGBA1C" in the last 72 hours.  CBG: No results for input(s): "GLUCAP" in the last 168 hours. Lipid Profile: No results for input(s): "CHOL", "HDL", "LDLCALC", "TRIG", "CHOLHDL", "LDLDIRECT" in the last 72 hours. Thyroid Function Tests: No results for input(s): "TSH", "T4TOTAL", "FREET4", "T3FREE", "THYROIDAB" in the last 72 hours. Anemia Panel: No results for input(s): "VITAMINB12", "FOLATE", "FERRITIN", "TIBC", "IRON", "RETICCTPCT" in the last 72  hours. Sepsis Labs: Recent Labs  Lab 05/28/23 1351 05/28/23 1500 05/28/23 2308  LATICACIDVEN 2.6* 2.2* 1.3    Recent Results (from the past 240 hour(s))  SARS Coronavirus 2 by RT PCR (hospital order, performed in [facility] hospital lab) *cepheid single result test* Anterior Nasal Swab     Status: None   Collection Time: 05/28/23  1:51 PM   Specimen: Anterior Nasal Swab  Result Value Ref Range Status   SARS Coronavirus 2 by RT PCR NEGATIVE NEGATIVE Final    Comment: (NOTE) SARS-CoV-2 target nucleic acids are NOT DETECTED.  The SARS-CoV-2 RNA is generally detectable in upper and lower respiratory specimens during the acute phase of infection. The lowest concentration of SARS-CoV-2 viral copies this assay can detect is 250 copies / mL. A negative result does not preclude SARS-CoV-2 infection and should not be used as the sole basis for treatment or other patient management decisions.  A negative result may occur with improper specimen collection / handling, submission of specimen other than nasopharyngeal swab, presence of viral mutation(s) within the areas targeted by  this assay, and inadequate number of viral copies (<250 copies / mL). A negative result must be combined with clinical observations, patient history, and epidemiological information.  Fact Sheet for Patients:   RoadLapTop.co.za  Fact Sheet for Healthcare Providers: http://kim-miller.com/  This test is not yet approved or  cleared by the [place] FDA and has been authorized for detection and/or diagnosis of SARS-CoV-2 by FDA under an Emergency Use Authorization (EUA).  This EUA will remain in effect (meaning this test can be used) for the duration of the COVID-19 declaration under Section 564(b)(1) of the Act, 21 U.S.C. section 360bbb-3(b)(1), unless the authorization is terminated or revoked sooner.  Performed at [facility], [address]  [place], [place] [address]   Resp panel by RT-PCR (RSV, Flu A&B, Covid) Anterior Nasal Swab     Status: None   Collection Time: 05/28/23  5:18 PM   Specimen: Anterior Nasal Swab  Result Value Ref Range Status   SARS Coronavirus 2 by RT PCR NEGATIVE NEGATIVE Final    Comment: (NOTE) SARS-CoV-2 target nucleic acids are NOT DETECTED.  The SARS-CoV-2 RNA is generally detectable in upper respiratory specimens during the acute phase of infection. The lowest concentration of SARS-CoV-2 viral copies this assay can detect is 138 copies/mL. A negative result does not preclude SARS-Cov-2 infection and should not be used as the sole basis for treatment or other patient management decisions. A negative result may occur with  improper specimen collection/handling, submission of specimen other than nasopharyngeal swab, presence of viral mutation(s) within the areas targeted by this assay, and inadequate number of viral copies(<138 copies/mL). A negative result must be combined with clinical observations, patient history, and epidemiological information. The expected result is Negative.  Fact Sheet for Patients:  BloggerCourse.com  Fact Sheet for Healthcare Providers:  SeriousBroker.it  This test is no t yet approved or cleared by the [place] FDA and  has been authorized for detection and/or diagnosis of SARS-CoV-2 by FDA under an Emergency Use Authorization (EUA). This EUA will remain  in effect (meaning this test can be used) for the duration of the COVID-19 declaration under Section 564(b)(1) of the Act, 21 U.S.C.section 360bbb-3(b)(1), unless the authorization is terminated  or revoked sooner.       Influenza A by PCR NEGATIVE NEGATIVE Final   Influenza B by PCR NEGATIVE NEGATIVE Final    Comment: (NOTE) The Xpert Xpress SARS-CoV-2/FLU/RSV plus assay is intended as an aid in the diagnosis of influenza from Nasopharyngeal swab specimens  and should not be used as a sole basis for treatment. Nasal washings and aspirates are unacceptable for Xpert Xpress SARS-CoV-2/FLU/RSV testing.  Fact Sheet for Patients: BloggerCourse.com  Fact Sheet for Healthcare Providers: SeriousBroker.it  This test is not yet approved or cleared by the [place] FDA and has been authorized for detection and/or diagnosis of SARS-CoV-2 by FDA under an Emergency Use Authorization (EUA). This EUA will remain in effect (meaning this test can be used) for the duration of the COVID-19 declaration under Section 564(b)(1) of the Act, 21 U.S.C. section 360bbb-3(b)(1), unless the authorization is terminated or revoked.     Resp Syncytial Virus by PCR NEGATIVE NEGATIVE Final    Comment: (NOTE) Fact Sheet for Patients: BloggerCourse.com  Fact Sheet for Healthcare Providers: SeriousBroker.it  This test is not yet approved or cleared by the [place] FDA and has been authorized for detection and/or diagnosis of SARS-CoV-2 by FDA under an Emergency Use Authorization (EUA). This EUA will remain  in effect (meaning this test can be used) for the duration of the COVID-19 declaration under Section 564(b)(1) of the Act, 21 U.S.C. section 360bbb-3(b)(1), unless the authorization is terminated or revoked.  Performed at [facility] [address] [place], [place] [address]   Culture, blood (Routine X 2) w Reflex to ID Panel     Status: None   Collection Time: 05/28/23  7:45 PM   Specimen: BLOOD  Result Value Ref Range Status   Specimen Description BLOOD  Final   Special Requests   Final    BOTTLES DRAWN AEROBIC AND ANAEROBIC Blood Culture results may not be optimal due to an inadequate volume of blood received in culture bottles   Culture   Final    NO GROWTH 5 DAYS Performed at [facility], [address] [place],  [place] [address]    Report Status 06/02/2023 FINAL  Final  Culture, blood (Routine X 2) w Reflex to ID Panel     Status: None   Collection Time: 05/28/23  7:48 PM   Specimen: BLOOD  Result Value Ref Range Status   Specimen Description BLOOD  Final   Special Requests   Final    BOTTLES DRAWN AEROBIC AND ANAEROBIC Blood Culture results may not be optimal due to an inadequate volume of blood received in culture bottles   Culture   Final    NO GROWTH 5 DAYS Performed at [facility], [address] [place], [place] [address]    Report Status 06/02/2023 FINAL  Final  MRSA Next Gen by PCR, Nasal     Status: None   Collection Time: 05/28/23 11:17 PM   Specimen: Nasal Mucosa; Nasal Swab  Result Value Ref Range Status   MRSA by PCR Next Gen NOT DETECTED NOT DETECTED Final    Comment: (NOTE) The GeneXpert MRSA Assay (FDA approved for NASAL specimens only), is one component of a comprehensive MRSA colonization surveillance program. It is not intended to diagnose MRSA infection nor to guide or monitor treatment for MRSA infections. Test performance is not FDA approved in patients less than 23 years old. Performed at [facility], [address] [place], [place] [address]   Body fluid culture w Gram Stain     Status: None   Collection Time: 05/29/23  8:08 AM   Specimen: Synovium; Body Fluid  Result Value Ref Range Status   Specimen Description   Final    SYNOVIAL Performed at [facility] [address] [place], [place] [address]    Special Requests   Final    RIGHT KNEE ASPIRATE Performed at [facility], [address] [place], [place] [address]    Gram Stain   Final    ABUNDANT WBC PRESENT,BOTH PMN AND MONONUCLEAR NO ORGANISMS SEEN    Culture   Final    NO GROWTH 3 DAYS Performed at [facility], [address] [place], [place] [address]    Report Status 06/01/2023 FINAL  Final  SARS Coronavirus 2 by RT PCR (hospital order, performed  in [facility] hospital lab) *cepheid single result test* Anterior Nasal Swab     Status: None   Collection Time: 06/01/23  8:45 AM   Specimen: Anterior Nasal Swab  Result Value Ref Range Status   SARS Coronavirus 2 by RT PCR NEGATIVE NEGATIVE Final    Comment: (NOTE) SARS-CoV-2 target nucleic acids are NOT DETECTED.  The SARS-CoV-2 RNA is generally detectable in upper and lower respiratory  specimens during the acute phase of infection. The lowest concentration of SARS-CoV-2 viral copies this assay can detect is 250 copies / mL. A negative result does not preclude SARS-CoV-2 infection and should not be used as the sole basis for treatment or other patient management decisions.  A negative result may occur with improper specimen collection / handling, submission of specimen other than nasopharyngeal swab, presence of viral mutation(s) within the areas targeted by this assay, and inadequate number of viral copies (<250 copies / mL). A negative result must be combined with clinical observations, patient history, and epidemiological information.  Fact Sheet for Patients:   RoadLapTop.co.za  Fact Sheet for Healthcare Providers: http://kim-miller.com/  This test is not yet approved or  cleared by the [place] FDA and has been authorized for detection and/or diagnosis of SARS-CoV-2 by FDA under an Emergency Use Authorization (EUA).  This EUA will remain in effect (meaning this test can be used) for the duration of the COVID-19 declaration under Section 564(b)(1) of the Act, 21 U.S.C. section 360bbb-3(b)(1), unless the authorization is terminated or revoked sooner.  Performed at [facility], [address] [place], [place] [address]   Aerobic/Anaerobic Culture w Gram Stain (surgical/deep wound)     Status: None (Preliminary result)   Collection Time: 06/01/23  2:14 PM   Specimen: Wound  Result Value Ref Range Status    Specimen Description   Final    WOUND RIGHT KNEE Performed at [facility], [address] [place], [place] [address]    Special Requests   Final    NONE Performed at [facility], [address] [place], [place] [address]    Gram Stain NO WBC SEEN NO ORGANISMS SEEN   Final   Culture   Final    NO GROWTH < 24 HOURS Performed at [facility], [address], [place], [place] [address]    Report Status PENDING  Incomplete  Aerobic/Anaerobic Culture w Gram Stain (surgical/deep wound)     Status: None (Preliminary result)   Collection Time: 06/01/23  2:19 PM   Specimen: Path Tissue  Result Value Ref Range Status   Specimen Description   Final    TISSUE RIGHT KNEE Performed at [facility], [address], [place], [place] [address]    Special Requests   Final    NONE Performed at [facility], [address] [place], [place] [address]    Gram Stain NO WBC SEEN RARE GRAM NEGATIVE RODS   Final   Culture   Final    NO GROWTH < 24 HOURS Performed at [facility], [address] [place], [place] [address]    Report Status PENDING  Incomplete         Radiology Studies: US Venous Img Lower Unilateral Right (DVT)  Result Date: 05/31/2023 CLINICAL DATA:  Right lower extremity pain and edema. Recent right total knee replacement. Evaluate for DVT. EXAM: RIGHT LOWER EXTREMITY VENOUS DOPPLER ULTRASOUND TECHNIQUE: Gray-scale sonography with graded compression, as well as color Doppler and duplex ultrasound were performed to evaluate the lower extremity deep venous systems from the level of the common femoral vein and including the common femoral, femoral, profunda femoral, popliteal and calf veins including the posterior tibial, peroneal and gastrocnemius veins when visible. The superficial great saphenous vein was also interrogated. Spectral Doppler was utilized to evaluate flow at rest and with distal augmentation maneuvers in the common  femoral, femoral and popliteal veins.  COMPARISON:  None Available. FINDINGS: Contralateral Common Femoral Vein: Respiratory phasicity is normal and symmetric with the symptomatic side. No evidence of thrombus. Normal compressibility. Common Femoral Vein: No evidence of thrombus. Normal compressibility, respiratory phasicity and response to augmentation. Saphenofemoral Junction: No evidence of thrombus. Normal compressibility and flow on color Doppler imaging. Profunda Femoral Vein: No evidence of thrombus. Normal compressibility and flow on color Doppler imaging. Femoral Vein: No evidence of thrombus. Normal compressibility, respiratory phasicity and response to augmentation. Popliteal Vein: No evidence of thrombus. Normal compressibility, respiratory phasicity and response to augmentation. Calf Veins: No evidence of thrombus. Normal compressibility and flow on color Doppler imaging. Superficial Great Saphenous Vein: No evidence of thrombus. Normal compressibility. Other Findings:  None. IMPRESSION: No evidence of DVT within the right lower extremity. Electronically Signed   By: Simonne Come M.D.   On: 05/31/2023 16:58        Scheduled Meds:  acetaminophen  1,000 mg Oral Q6H   apixaban  2.5 mg Oral BID   docusate sodium  100 mg Oral BID   DULoxetine  60 mg Oral Daily   fluticasone furoate-vilanterol  1 puff Inhalation Daily   And   umeclidinium bromide  1 puff Inhalation Daily   ketorolac  7.5 mg Intravenous Q6H   nortriptyline  20 mg Oral QHS   pantoprazole  40 mg Oral BID AC   polyethylene glycol  17 g Oral Daily   pregabalin  150 mg Oral TID   rosuvastatin  10 mg Oral QHS   senna-docusate  1 tablet Oral BID   zolpidem  5 mg Oral QHS   Continuous Infusions:  sodium chloride 75 mL/hr at 06/01/23 1810   ceFEPime (MAXIPIME) IV 2 g (06/02/23 0909)   metronidazole 500 mg (06/01/23 2101)   vancomycin 1,000 mg (06/02/23 0617)     LOS: 5 days     Tresa Moore, MD [facility]   If 7PM-7AM, please contact night-coverage  06/02/2023, 10:56 AM

## 2023-06-03 DIAGNOSIS — Z96652 Presence of left artificial knee joint: Secondary | ICD-10-CM | POA: Diagnosis not present

## 2023-06-03 DIAGNOSIS — R6883 Chills (without fever): Secondary | ICD-10-CM | POA: Diagnosis not present

## 2023-06-03 DIAGNOSIS — R6520 Severe sepsis without septic shock: Secondary | ICD-10-CM | POA: Diagnosis not present

## 2023-06-03 DIAGNOSIS — A419 Sepsis, unspecified organism: Secondary | ICD-10-CM | POA: Diagnosis not present

## 2023-06-03 DIAGNOSIS — R5383 Other fatigue: Secondary | ICD-10-CM | POA: Diagnosis not present

## 2023-06-03 DIAGNOSIS — D649 Anemia, unspecified: Secondary | ICD-10-CM | POA: Diagnosis not present

## 2023-06-03 LAB — CBC
HCT: 39.5 % (ref 39.0–52.0)
Hemoglobin: 13 g/dL (ref 13.0–17.0)
MCH: 31.6 pg (ref 26.0–34.0)
MCHC: 32.9 g/dL (ref 30.0–36.0)
MCV: 96.1 fL (ref 80.0–100.0)
Platelets: 251 10*3/uL (ref 150–400)
RBC: 4.11 MIL/uL — ABNORMAL LOW (ref 4.22–5.81)
RDW: 15.2 % (ref 11.5–15.5)
WBC: 11 10*3/uL — ABNORMAL HIGH (ref 4.0–10.5)
nRBC: 0 % (ref 0.0–0.2)

## 2023-06-03 LAB — CREATININE, SERUM
Creatinine, Ser: 0.94 mg/dL (ref 0.61–1.24)
GFR, Estimated: >60 mL/min (ref >60)

## 2023-06-03 MED ORDER — METHOCARBAMOL 500 MG PO TABS
500.0000 mg | ORAL_TABLET | Freq: Three times a day (TID) | ORAL | Status: DC | PRN
  Administered 2023-06-03 – 2023-06-04 (×3): 500 mg via ORAL
  Filled 2023-06-03 (×3): qty 1

## 2023-06-03 MED ORDER — ACETAMINOPHEN 500 MG PO TABS: 1000.0000 mg | ORAL_TABLET | Freq: Four times a day (QID) | ORAL | Status: DC | PRN

## 2023-06-03 NOTE — Progress Notes (Signed)
Date of Admission:  05/28/2023     ID: Anthony Bender is a 67 y.o. male  Principal Problem:   Severe sepsis with acute organ dysfunction (HCC) Active Problems:   Benign prostatic hyperplasia   Chronic insomnia   COPD (chronic obstructive pulmonary disease) (HCC)   Hyperlipidemia, unspecified   Status post total knee replacement using cement, right   Leukocytosis   Elevated troponin   Depression    Subjective: Pt is doing fine  Medications:   apixaban  2.5 mg Oral BID   docusate sodium  100 mg Oral BID   DULoxetine  60 mg Oral Daily   fluticasone furoate-vilanterol  1 puff Inhalation Daily   And   umeclidinium bromide  1 puff Inhalation Daily   nortriptyline  20 mg Oral QHS   pantoprazole  40 mg Oral BID AC   polyethylene glycol  17 g Oral Daily   pregabalin  150 mg Oral TID   rosuvastatin  10 mg Oral QHS   senna-docusate  1 tablet Oral BID   zolpidem  5 mg Oral QHS    Objective: Vital signs in last 24 hours: Patient Vitals for the past 24 hrs:  BP Temp Pulse Resp SpO2  06/03/23 0851 (!) 148/98 98.2 F (36.8 C) 95 18 100 %  06/02/23 2243 (!) 146/89 97.7 F (36.5 C) 86 18 97 %       PHYSICAL EXAM:  General: Alert, cooperative, no distress, appears stated age.  Lungs: Clear to auscultation bilaterally. No Wheezing or Rhonchi. No rales. Heart: Regular rate and rhythm, no murmur, rub or gallop. Abdomen: Soft, non-tender,not distended. Bowel sounds normal. No masses Extremities: rt knee drain removed Skin: No rashes or lesions. Or bruising Lymph: Cervical, supraclavicular normal. Neurologic: Grossly non-focal  Lab Results    Latest Ref Rng & Units 06/03/2023    4:10 AM 06/02/2023    5:09 AM 06/01/2023    8:44 AM  CBC  WBC 4.0 - 10.5 K/uL 11.0  15.1  10.6   Hemoglobin 13.0 - 17.0 g/dL 44.0  10.2  72.5   Hematocrit 39.0 - 52.0 % 39.5  36.8  41.2   Platelets 150 - 400 K/uL 251  281  290        Latest Ref Rng & Units 06/03/2023    4:10 AM 06/02/2023     5:09 AM 06/01/2023    4:15 AM  CMP  Glucose 70 - 99 mg/dL  366    BUN 8 - 23 mg/dL  21    Creatinine 4.40 - 1.24 mg/dL 3.47  4.25  9.56   Sodium 135 - 145 mmol/L  133    Potassium 3.5 - 5.1 mmol/L  3.9    Chloride 98 - 111 mmol/L  101    CO2 22 - 32 mmol/L  24    Calcium 8.9 - 10.3 mg/dL  8.7        Microbiology: Knee surgical culture NG so far   Assessment/Plan: 67 year old male presenting with, chills and fatigue following total knee arthroplasty on the right 10 days ago.   Leukocytosis resolved Aspirate of the right knee joint was not very significant for septic arthritis Washout was done and cultures were sent Gram-negative rod on the Gram stain  could be an error in reading  especially with no WBCs on the Gram stain.  Culture neg so far Continue Vanco and cefepime On discharge can do Levaquin + augmentin for 2 weeks   -Anemia on rosuvastatin  Anxiety/depression on duloxetine, Nortriptyline and Ambien  Discussed the management with the patient and Dr.Poggi

## 2023-06-03 NOTE — Progress Notes (Signed)
Progress Note    Anthony Bender  NWG:[id number] DOB: November 12, 1955  DOA: 05/28/2023 PCP: Kennis Carina, MD      Brief Narrative:    Medical records reviewed and are as summarized below:  Anthony Bender is a 67 y.o. male with history of depression, anxiety, on anticoagulation with Eliquis, restless leg syndrome, hyperlipidemia, hypertension, insomnia, recent right total knee arthroplasty on 05/19/2023, who presented to the hospital with chills for 3 days and vomiting.  He also complained of sensation of loss of taste.  Lactic acid was initially 2.6 and on repeat was 2.2.  High sensitive troponin was 86 and on repeat was 90.  COVID/influenza A/influenza B/RSV PCR were negative  ED treatment: Vancomycin, cefepime, fentanyl 50 mcg IV one-time dose, lactated ringer 1 L bolus.   He was admitted to the hospital for severe sepsis.   Assessment/Plan:   Principal Problem:   Severe sepsis with acute organ dysfunction (HCC) Active Problems:   Status post total knee replacement using cement, right   Elevated troponin   Benign prostatic hyperplasia   Chronic insomnia   COPD (chronic obstructive pulmonary disease) (HCC)   Hyperlipidemia, unspecified   Leukocytosis   Depression   Body mass index is 24.25 kg/m.   Severe sepsis, possible septic right knee arthritis, s/p right total knee arthroplasty on 05/19/2023: S/p repeat irrigation and debridement right knee on 06/01/2023.  Continue empiric IV antibiotics.  Analgesia as needed for pain.  Follow-up wound cultures.  No growth on blood cultures thus far.  Follow-up with orthopedic surgeon and ID specialist Leukocytosis is improving.   Elevated troponins: This was attributed to demand ischemia   COPD: Continue bronchodilators   Other comorbidities include insomnia, depression, hyperlipidemia   Diet Order             Diet regular Room service appropriate? Yes; Fluid consistency: Thin  Diet effective now                             Consultants: Orthopedic surgeon Infectious disease specialist  Procedures: Irrigation and debridement of right knee on 06/01/2023    Medications:    apixaban  2.5 mg Oral BID   docusate sodium  100 mg Oral BID   DULoxetine  60 mg Oral Daily   fluticasone furoate-vilanterol  1 puff Inhalation Daily   And   umeclidinium bromide  1 puff Inhalation Daily   nortriptyline  20 mg Oral QHS   pantoprazole  40 mg Oral BID AC   polyethylene glycol  17 g Oral Daily   pregabalin  150 mg Oral TID   rosuvastatin  10 mg Oral QHS   senna-docusate  1 tablet Oral BID   zolpidem  5 mg Oral QHS   Continuous Infusions:  sodium chloride Stopped (06/02/23 0328)   ceFEPime (MAXIPIME) IV 2 g (06/03/23 0636)   vancomycin 1,250 mg (06/03/23 0501)     Anti-infectives (From admission, onward)    Start     Dose/Rate Route Frequency Ordered Stop   06/02/23 1800  vancomycin (VANCOREADY) IVPB 1250 mg/250 mL        1,250 mg 166.7 mL/hr over 90 Minutes Intravenous Every 12 hours 06/02/23 1538     06/01/23 1425  vancomycin (VANCOCIN) powder  Status:  Discontinued          As needed 06/01/23 1426 06/01/23 1535   05/29/23 0600  vancomycin (VANCOCIN) IVPB 1000 mg/200 mL premix  Status:  Discontinued        1,000 mg 200 mL/hr over 60 Minutes Intravenous Every 12 hours 05/28/23 1914 06/02/23 1538   05/28/23 2300  ceFEPIme (MAXIPIME) 2 g in sodium chloride 0.9 % 100 mL IVPB        2 g 200 mL/hr over 30 Minutes Intravenous Every 8 hours 05/28/23 1914     05/28/23 2000  metroNIDAZOLE (FLAGYL) IVPB 500 mg  Status:  Discontinued        500 mg 100 mL/hr over 60 Minutes Intravenous Every 12 hours 05/28/23 1848 06/02/23 1501   05/28/23 1915  vancomycin (VANCOREADY) IVPB 750 mg/150 mL        750 mg 150 mL/hr over 60 Minutes Intravenous  Once 05/28/23 1909 05/28/23 2130   05/28/23 1515  vancomycin (VANCOCIN) IVPB 1000 mg/200 mL premix        1,000 mg 200 mL/hr over 60 Minutes  Intravenous  Once 05/28/23 1507 05/28/23 1835   05/28/23 1515  ceFEPIme (MAXIPIME) 2 g in sodium chloride 0.9 % 100 mL IVPB        2 g 200 mL/hr over 30 Minutes Intravenous  Once 05/28/23 1507 05/28/23 1721              Family Communication/Anticipated D/C date and plan/Code Status   DVT prophylaxis: apixaban (ELIQUIS) tablet 2.5 mg Start: 06/02/23 1000 SCDs Start: 06/01/23 1707 Place TED hose Start: 06/01/23 1707 apixaban (ELIQUIS) tablet 2.5 mg     Code Status: Full Code  Family Communication: Plan discussed with family at the bedside Disposition Plan: Plan to discharge home   Status is: Inpatient Remains inpatient appropriate because: On IV antibiotics for severe sepsis       Subjective:   Interval events noted.  He complains of right knee pain.  Family at the bedside.  He wanted his family to start the bedside during this encounter.  Objective:    Vitals:   06/02/23 0416 06/02/23 0918 06/02/23 2243 06/03/23 0851  BP: 103/67 133/74 (!) 146/89 (!) 148/98  Pulse: 68 71 86 95  Resp:  18 18 18   Temp: 97.7 F (36.5 C) 98 F (36.7 C) 97.7 F (36.5 C) 98.2 F (36.8 C)  TempSrc: Oral     SpO2: 100% 100% 97% 100%  Weight:      Height:       No data found.   Intake/Output Summary (Last 24 hours) at 06/03/2023 1303 Last data filed at 06/03/2023 0558 Gross per 24 hour  Intake 3438.77 ml  Output 3800 ml  Net -361.23 ml   Filed Weights   05/28/23 1346 05/29/23 0402 06/01/23 1248  Weight: 83.9 kg 74.5 kg 74.5 kg    Exam:  GEN: NAD SKIN: Warm and dry EYES: No pallor or icterus ENT: MMM CV: RRR PULM: CTA B ABD: soft, ND, NT, +BS CNS: AAO x 3, non focal EXT: Dressing on right knee surgical wound is intact.  Right knee connected to polar ice machine        Data Reviewed:   I have personally reviewed following labs and imaging studies:  Labs: Labs show the following:   Basic Metabolic Panel: Recent Labs  Lab 05/28/23 1351 05/29/23 0538  06/01/23 0415 06/02/23 0509 06/03/23 0410  NA 134* 135  --  133*  --   K 3.5 3.8  --  3.9  --   CL 104 107  --  101  --   CO2 20* 23  --  24  --  GLUCOSE 128* 127*  --  129*  --   BUN 23 19  --  21  --   CREATININE 0.96 0.74 0.83 0.79 0.94  CALCIUM 9.6 9.2  --  8.7*  --   MG  --  2.5*  --   --   --    GFR Estimated Creatinine Clearance: 75.2 mL/min (by C-G formula based on SCr of 0.94 mg/dL). Liver Function Tests: Recent Labs  Lab 05/28/23 1351 05/29/23 0538  AST 29 20  ALT 22 18  ALKPHOS 97 69  BILITOT 1.8* 1.3*  PROT 8.3* 6.5  ALBUMIN 4.3 3.5   Recent Labs  Lab 05/28/23 1351  LIPASE 44   No results for input(s): "AMMONIA" in the last 168 hours. Coagulation profile Recent Labs  Lab 05/28/23 1351  INR 1.0    CBC: Recent Labs  Lab 05/28/23 1351 05/29/23 0538 06/01/23 0844 06/02/23 0509 06/03/23 0410  WBC 18.6* 13.9* 10.6* 15.1* 11.0*  NEUTROABS  --   --  7.8*  --   --   HGB 16.9 13.6 13.9 12.3* 13.0  HCT 50.1 40.0 41.2 36.8* 39.5  MCV 92.6 93.9 93.6 94.4 96.1  PLT 454* 315 290 281 251   Cardiac Enzymes: No results for input(s): "CKTOTAL", "CKMB", "CKMBINDEX", "TROPONINI" in the last 168 hours. BNP (last 3 results) No results for input(s): "PROBNP" in the last 8760 hours. CBG: No results for input(s): "GLUCAP" in the last 168 hours. D-Dimer: No results for input(s): "DDIMER" in the last 72 hours. Hgb A1c: No results for input(s): "HGBA1C" in the last 72 hours. Lipid Profile: No results for input(s): "CHOL", "HDL", "LDLCALC", "TRIG", "CHOLHDL", "LDLDIRECT" in the last 72 hours. Thyroid function studies: No results for input(s): "TSH", "T4TOTAL", "T3FREE", "THYROIDAB" in the last 72 hours.  Invalid input(s): "FREET3" Anemia work up: No results for input(s): "VITAMINB12", "FOLATE", "FERRITIN", "TIBC", "IRON", "RETICCTPCT" in the last 72 hours. Sepsis Labs: Recent Labs  Lab 05/28/23 1351 05/28/23 1500 05/28/23 2308 05/29/23 0538  06/01/23 0844 06/02/23 0509 06/03/23 0410  WBC 18.6*  --   --  13.9* 10.6* 15.1* 11.0*  LATICACIDVEN 2.6* 2.2* 1.3  --   --   --   --     Microbiology Recent Results (from the past 240 hour(s))  SARS Coronavirus 2 by RT PCR (hospital order, performed in [facility] hospital lab) *cepheid single result test* Anterior Nasal Swab     Status: None   Collection Time: 05/28/23  1:51 PM   Specimen: Anterior Nasal Swab  Result Value Ref Range Status   SARS Coronavirus 2 by RT PCR NEGATIVE NEGATIVE Final    Comment: (NOTE) SARS-CoV-2 target nucleic acids are NOT DETECTED.  The SARS-CoV-2 RNA is generally detectable in upper and lower respiratory specimens during the acute phase of infection. The lowest concentration of SARS-CoV-2 viral copies this assay can detect is 250 copies / mL. A negative result does not preclude SARS-CoV-2 infection and should not be used as the sole basis for treatment or other patient management decisions.  A negative result may occur with improper specimen collection / handling, submission of specimen other than nasopharyngeal swab, presence of viral mutation(s) within the areas targeted by this assay, and inadequate number of viral copies (<250 copies / mL). A negative result must be combined with clinical observations, patient history, and epidemiological information.  Fact Sheet for Patients:   RoadLapTop.co.za  Fact Sheet for Healthcare Providers: http://kim-miller.com/  This test is not yet approved or  cleared by  the [employer] and has been authorized for detection and/or diagnosis of SARS-CoV-2 by FDA under an Emergency Use Authorization (EUA).  This EUA will remain in effect (meaning this test can be used) for the duration of the COVID-19 declaration under Section 564(b)(1) of the Act, 21 U.S.C. section 360bbb-3(b)(1), unless the authorization is terminated or revoked sooner.  Performed at  [facility], [address] [place], [place] [address]   Resp panel by RT-PCR (RSV, Flu A&B, Covid) Anterior Nasal Swab     Status: None   Collection Time: 05/28/23  5:18 PM   Specimen: Anterior Nasal Swab  Result Value Ref Range Status   SARS Coronavirus 2 by RT PCR NEGATIVE NEGATIVE Final    Comment: (NOTE) SARS-CoV-2 target nucleic acids are NOT DETECTED.  The SARS-CoV-2 RNA is generally detectable in upper respiratory specimens during the acute phase of infection. The lowest concentration of SARS-CoV-2 viral copies this assay can detect is 138 copies/mL. A negative result does not preclude SARS-Cov-2 infection and should not be used as the sole basis for treatment or other patient management decisions. A negative result may occur with  improper specimen collection/handling, submission of specimen other than nasopharyngeal swab, presence of viral mutation(s) within the areas targeted by this assay, and inadequate number of viral copies(<138 copies/mL). A negative result must be combined with clinical observations, patient history, and epidemiological information. The expected result is Negative.  Fact Sheet for Patients:  BloggerCourse.com  Fact Sheet for Healthcare Providers:  SeriousBroker.it  This test is no t yet approved or cleared by the [place] FDA and  has been authorized for detection and/or diagnosis of SARS-CoV-2 by FDA under an Emergency Use Authorization (EUA). This EUA will remain  in effect (meaning this test can be used) for the duration of the COVID-19 declaration under Section 564(b)(1) of the Act, 21 U.S.C.section 360bbb-3(b)(1), unless the authorization is terminated  or revoked sooner.       Influenza A by PCR NEGATIVE NEGATIVE Final   Influenza B by PCR NEGATIVE NEGATIVE Final    Comment: (NOTE) The Xpert Xpress SARS-CoV-2/FLU/RSV plus assay is intended as an aid in the diagnosis of  influenza from Nasopharyngeal swab specimens and should not be used as a sole basis for treatment. Nasal washings and aspirates are unacceptable for Xpert Xpress SARS-CoV-2/FLU/RSV testing.  Fact Sheet for Patients: BloggerCourse.com  Fact Sheet for Healthcare Providers: SeriousBroker.it  This test is not yet approved or cleared by the [place] FDA and has been authorized for detection and/or diagnosis of SARS-CoV-2 by FDA under an Emergency Use Authorization (EUA). This EUA will remain in effect (meaning this test can be used) for the duration of the COVID-19 declaration under Section 564(b)(1) of the Act, 21 U.S.C. section 360bbb-3(b)(1), unless the authorization is terminated or revoked.     Resp Syncytial Virus by PCR NEGATIVE NEGATIVE Final    Comment: (NOTE) Fact Sheet for Patients: BloggerCourse.com  Fact Sheet for Healthcare Providers: SeriousBroker.it  This test is not yet approved or cleared by the [place] FDA and has been authorized for detection and/or diagnosis of SARS-CoV-2 by FDA under an Emergency Use Authorization (EUA). This EUA will remain in effect (meaning this test can be used) for the duration of the COVID-19 declaration under Section 564(b)(1) of the Act, 21 U.S.C. section 360bbb-3(b)(1), unless the authorization is terminated or revoked.  Performed at [facility], [address], [place], [place] [address]   Culture, blood (Routine X 2)  w Reflex to ID Panel     Status: None   Collection Time: 05/28/23  7:45 PM   Specimen: BLOOD  Result Value Ref Range Status   Specimen Description BLOOD  Final   Special Requests   Final    BOTTLES DRAWN AEROBIC AND ANAEROBIC Blood Culture results may not be optimal due to an inadequate volume of blood received in culture bottles   Culture   Final    NO GROWTH 5 DAYS Performed at [facility], [address] [place], [place] [address]    Report Status 06/02/2023 FINAL  Final  Culture, blood (Routine X 2) w Reflex to ID Panel     Status: None   Collection Time: 05/28/23  7:48 PM   Specimen: BLOOD  Result Value Ref Range Status   Specimen Description BLOOD  Final   Special Requests   Final    BOTTLES DRAWN AEROBIC AND ANAEROBIC Blood Culture results may not be optimal due to an inadequate volume of blood received in culture bottles   Culture   Final    NO GROWTH 5 DAYS Performed at [facility], [address], [place], [place] [address]    Report Status 06/02/2023 FINAL  Final  MRSA Next Gen by PCR, Nasal     Status: None   Collection Time: 05/28/23 11:17 PM   Specimen: Nasal Mucosa; Nasal Swab  Result Value Ref Range Status   MRSA by PCR Next Gen NOT DETECTED NOT DETECTED Final    Comment: (NOTE) The GeneXpert MRSA Assay (FDA approved for NASAL specimens only), is one component of a comprehensive MRSA colonization surveillance program. It is not intended to diagnose MRSA infection nor to guide or monitor treatment for MRSA infections. Test performance is not FDA approved in patients less than 44 years old. Performed at [facility], [address] [place], [place] [address]   Body fluid culture w Gram Stain     Status: None   Collection Time: 05/29/23  8:08 AM   Specimen: Synovium; Body Fluid  Result Value Ref Range Status   Specimen Description   Final    SYNOVIAL Performed at [facility], [address] [place], [place] [address]    Special Requests   Final    RIGHT KNEE ASPIRATE Performed at [facility], [address] [place], [place] [address]    Gram Stain   Final    ABUNDANT WBC PRESENT,BOTH PMN AND MONONUCLEAR NO ORGANISMS SEEN    Culture   Final    NO GROWTH 3 DAYS Performed at [facility], [address] [place], [place] [address]    Report Status 06/01/2023 FINAL  Final  SARS  Coronavirus 2 by RT PCR (hospital order, performed in [facility] hospital lab) *cepheid single result test* Anterior Nasal Swab     Status: None   Collection Time: 06/01/23  8:45 AM   Specimen: Anterior Nasal Swab  Result Value Ref Range Status   SARS Coronavirus 2 by RT PCR NEGATIVE NEGATIVE Final    Comment: (NOTE) SARS-CoV-2 target nucleic acids are NOT DETECTED.  The SARS-CoV-2 RNA is generally detectable in upper and lower respiratory specimens during the acute phase of infection. The lowest concentration of SARS-CoV-2 viral copies this assay can detect is 250 copies / mL. A negative result does not preclude SARS-CoV-2 infection and should not be used as the sole basis for treatment or other patient management decisions.  A negative result may  occur with improper specimen collection / handling, submission of specimen other than nasopharyngeal swab, presence of viral mutation(s) within the areas targeted by this assay, and inadequate number of viral copies (<250 copies / mL). A negative result must be combined with clinical observations, patient history, and epidemiological information.  Fact Sheet for Patients:   RoadLapTop.co.za  Fact Sheet for Healthcare Providers: http://kim-miller.com/  This test is not yet approved or  cleared by the [place] FDA and has been authorized for detection and/or diagnosis of SARS-CoV-2 by FDA under an Emergency Use Authorization (EUA).  This EUA will remain in effect (meaning this test can be used) for the duration of the COVID-19 declaration under Section 564(b)(1) of the Act, 21 U.S.C. section 360bbb-3(b)(1), unless the authorization is terminated or revoked sooner.  Performed at [facility], [address] [place], [place] [address]   Aerobic/Anaerobic Culture w Gram Stain (surgical/deep wound)     Status: None (Preliminary result)   Collection Time: 06/01/23  2:14 PM    Specimen: Wound  Result Value Ref Range Status   Specimen Description   Final    WOUND RIGHT KNEE Performed at [facility], [address], [place], [place] [address]    Special Requests   Final    NONE Performed at [facility], [address] [place], [place] [address]    Gram Stain NO WBC SEEN NO ORGANISMS SEEN   Final   Culture   Final    NO GROWTH 2 DAYS NO ANAEROBES ISOLATED; CULTURE IN PROGRESS FOR 5 DAYS Performed at [facility], [address], [place], [place] [address]    Report Status PENDING  Incomplete  Aerobic/Anaerobic Culture w Gram Stain (surgical/deep wound)     Status: None (Preliminary result)   Collection Time: 06/01/23  2:19 PM   Specimen: Path Tissue  Result Value Ref Range Status   Specimen Description   Final    TISSUE RIGHT KNEE Performed at [facility], [address], [place], [place] [address]    Special Requests   Final    NONE Performed at [facility], [address] [place], [place] [address]    Gram Stain NO WBC SEEN RARE GRAM NEGATIVE RODS   Final   Culture   Final    NO GROWTH 2 DAYS NO ANAEROBES ISOLATED; CULTURE IN PROGRESS FOR 5 DAYS Performed at [facility], [address], [place], [place] [address]    Report Status PENDING  Incomplete    Procedures and diagnostic studies:  No results found.             LOS: 6 days   Marjo Grosvenor  [facility]   Pager on [email or web address]. If 7PM-7AM, please contact night-coverage at [email or web address]     06/03/2023, 1:03 PM

## 2023-06-03 NOTE — Progress Notes (Addendum)
Physical Therapy Treatment Patient Details Name: Anthony Bender MRN: [id number] DOB: Jun 23, 1956 Today's Date: 06/03/2023   History of Present Illness Pt is a 67 y/o male presenting due to initial complaints of vomiting, abdominal pain, and generalized weakness. He is now admitted for severe sepsis with acute organ dysfunction and s/p formal irrigation and debridement with polyethylene exchange of his right knee for possible septic arthritis status post his right total knee arthroplasty (8/26). PMH includes R TKA (05/19/23), HLD, HTN, insomnia, depression, anxiety, and COPD.    PT Comments  Pt presents with nursing and wife in room, 9/10 pain with mobility but received pain medication at the start of session. Pt continues to be modi with bed mobility due to increased time/effort and being limited by pain. Pt performed sit<>stand and ambulated ~185ft with CGA and RW, noting increase in pain with mobility but overall improved steadiness in standing compared to previous visit. Pt able to ascend backward/ descend forward 1 step with RW and CGA. PT educated on safe stair negotiation. He was able to teach back safe stair negotiation and demonstrate safe practices without additional therapist facilitation. Pt overall limited by pain throughout session and would benefit from continued skilled therapy to maximize functional abilities   If plan is discharge home, recommend the following: A little help with walking and/or transfers;A little help with bathing/dressing/bathroom;Help with stairs or ramp for entrance;Assist for transportation;Assistance with cooking/housework;Direct supervision/assist for medications management   Can travel by private vehicle        Equipment Recommendations  None recommended by PT    Recommendations for Other Services       Precautions / Restrictions Precautions Precautions: Knee;Fall Restrictions Weight Bearing Restrictions: Yes RLE Weight Bearing: Weight bearing as  tolerated     Mobility  Bed Mobility Overal bed mobility: Modified Independent             General bed mobility comments: continued increased time/effort to perform supine>sit due to pain, utilized LLE to assist RLE    Transfers Overall transfer level: Needs assistance Equipment used: Rolling walker (2 wheels) Transfers: Sit to/from Stand Sit to Stand: Contact guard assist           General transfer comment: continued increase in pain with initial standing and needing standing break before ambulation, improved steadiness in standing    Ambulation/Gait Ambulation/Gait assistance: Contact guard assist Gait Distance (Feet): 100 Feet Assistive device: Rolling walker (2 wheels) Gait Pattern/deviations: Decreased step length - left, Decreased step length - right, Decreased stance time - right, Decreased weight shift to right, Antalgic Gait velocity: decreased     General Gait Details: limited by pain   Stairs Stairs: Yes Stairs assistance: Contact guard assist Stair Management: Backwards, With walker Number of Stairs: 1 General stair comments: ascend/descend   Wheelchair Mobility     Tilt Bed    Modified Rankin (Stroke Patients Only)       Balance Overall balance assessment: Needs assistance Sitting-balance support: Feet supported, No upper extremity supported Sitting balance-Leahy Scale: Good     Standing balance support: During functional activity, Bilateral upper extremity supported, Reliant on assistive device for balance Standing balance-Leahy Scale: Fair Standing balance comment: limited by pain, relies on RW for offloading                            Cognition Arousal: Alert Behavior During Therapy: [facility] for tasks assessed/performed Overall Cognitive Status: Within Functional Limits for tasks assessed  Exercises      General Comments        Pertinent Vitals/Pain Pain  Assessment Pain Assessment: 0-10 Pain Score: 9  Pain Location: R knee with mobility Pain Descriptors / Indicators: Discomfort, Constant, Grimacing Pain Intervention(s): Monitored during session, Limited activity within patient's tolerance    Home Living                          Prior Function            PT Goals (current goals can now be found in the care plan section) Acute Rehab PT Goals Patient Stated Goal: return home Potential to Achieve Goals: Good Progress towards PT goals: Progressing toward goals    Frequency    7X/week      PT Plan      Co-evaluation              AM-PAC PT "6 Clicks" Mobility   Outcome Measure  Help needed turning from your back to your side while in a flat bed without using bedrails?: None Help needed moving from lying on your back to sitting on the side of a flat bed without using bedrails?: None Help needed moving to and from a bed to a chair (including a wheelchair)?: A Little Help needed standing up from a chair using your arms (e.g., wheelchair or bedside chair)?: A Little Help needed to walk in hospital room?: A Little Help needed climbing 3-5 steps with a railing? : A Little 6 Click Score: 20    End of Session Equipment Utilized During Treatment: Gait belt Activity Tolerance: Patient tolerated treatment well;Patient limited by pain Patient left: with call bell/phone within reach;in chair;with chair alarm set;with family/visitor present   PT Visit Diagnosis: Other abnormalities of gait and mobility (R26.89);Muscle weakness (generalized) (M62.81);Difficulty in walking, not elsewhere classified (R26.2) Pain - Right/Left: Right Pain - part of body: Knee     Time: 6045-4098 PT Time Calculation (min) (ACUTE ONLY): 32 min  Charges:    $Gait Training: 8-22 mins $Therapeutic Activity: 8-22 mins PT General Charges $$ ACUTE PT VISIT: 1 Visit                    Vickie Ponds, PT, SPT 11:26 AM,06/03/23

## 2023-06-03 NOTE — Progress Notes (Signed)
Subjective: 2 Days Post-Op Procedure(s) (LRB): TOTAL KNEE REVISION (POLY EXCHANGE) WASH OUT (Right) Patient reports pain as 8 on 0-10 scale.   Patient is well, and has had no acute complaints or problems Plan is to go Home after hospital stay.  Was able to walk 150 feet yesterday with PT. Negative for chest pain and shortness of breath Fever: No recent fevers Gastrointestinal:Negative for nausea and vomiting  Objective: Vital signs in last 24 hours: Temp:  [97.7 F (36.5 C)-98 F (36.7 C)] 97.7 F (36.5 C) (08/27 2243) Pulse Rate:  [71-86] 86 (08/27 2243) Resp:  [18] 18 (08/27 2243) BP: (133-146)/(74-89) 146/89 (08/27 2243) SpO2:  [97 %-100 %] 97 % (08/27 2243)  Intake/Output from previous day:  Intake/Output Summary (Last 24 hours) at 06/03/2023 0758 Last data filed at 06/03/2023 0558 Gross per 24 hour  Intake 3998.77 ml  Output 4600 ml  Net -601.23 ml    Intake/Output this shift: No intake/output data recorded.  Labs: Recent Labs    06/01/23 0844 06/02/23 0509 06/03/23 0410  HGB 13.9 12.3* 13.0   Recent Labs    06/02/23 0509 06/03/23 0410  WBC 15.1* 11.0*  RBC 3.90* 4.11*  HCT 36.8* 39.5  PLT 281 251   Recent Labs    06/02/23 0509 06/03/23 0410  NA 133*  --   K 3.9  --   CL 101  --   CO2 24  --   BUN 21  --   CREATININE 0.79 0.94  GLUCOSE 129*  --   CALCIUM 8.7*  --    No results for input(s): "LABPT", "INR" in the last 72 hours.   EXAM General - Patient is Alert, Appropriate, and Oriented Extremity - ABD soft Neurovascular intact Dorsiflexion/Plantar flexion intact Incision: scant drainage No cellulitis present Compartment soft Dressing/Incision - Mild bloody drainage noted to the right knee honeycomb.  Mild bloody drainage into the hemovac today, no purulence is seen.  Hemovac removed without issue and new ACE wrap applied. Motor Function - intact, moving foot and toes well on exam.  Abdomen soft with intact bowel sounds.  Negative  Homans test to the right leg.  Past Medical History:  Diagnosis Date   Anxiety    Aortic ectasia (HCC)    Asthma    Barrett's esophagus    BPH (benign prostatic hyperplasia)    COPD (chronic obstructive pulmonary disease) (HCC)    DDD (degenerative disc disease), lumbar    GERD (gastroesophageal reflux disease)    Headache    Hemorrhoids    Hyperlipidemia    Hyperlipidemia    Hypertension    Insomnia    Mild episode of recurrent major depressive disorder (HCC)    Neuropathy    Neuropathy, peripheral, idiopathic    Osteoarthritis    Primary osteoarthritis of right knee    Smoker    Tobacco use    Umbilical hernia    Vitamin D deficiency     Assessment/Plan: 2 Days Post-Op Procedure(s) (LRB): TOTAL KNEE REVISION (POLY EXCHANGE) WASH OUT (Right) Principal Problem:   Severe sepsis with acute organ dysfunction (HCC) Active Problems:   Benign prostatic hyperplasia   Chronic insomnia   COPD (chronic obstructive pulmonary disease) (HCC)   Hyperlipidemia, unspecified   Status post total knee replacement using cement, right   Leukocytosis   Elevated troponin   Depression  Estimated body mass index is 24.25 kg/m as calculated from the following:   Height as of this encounter: 5\' 9"  (1.753 m).  Weight as of this encounter: 74.5 kg. Advance diet Up with therapy D/C IV fluids when tolerating po intake.  Labs reviewed this AM, WBC trending down to 11.0 this morning. Initial culture negative for growth at this time, no organisms seen on gram stain. New cultures obtained yesterday during procedure.  Gram stain from yesterday showing gram - rods. ID has been consulted.  Continue IV Cefepime and Vancomycin.  Will need to wait on final cultures prior to discharge. Hemovac was removed this morning without issue. Up with therapy as tolerated.  Reports a burning in the leg, continue with Lyrica.  DVT Prophylaxis - TED hose and Eliquis Weight-Bearing as tolerated to right  leg  J. Horris Latino, PA-C [facility] Orthopaedic Surgery 06/03/2023, 7:58 AM

## 2023-06-03 NOTE — Plan of Care (Signed)

## 2023-06-04 DIAGNOSIS — R6520 Severe sepsis without septic shock: Secondary | ICD-10-CM | POA: Diagnosis not present

## 2023-06-04 DIAGNOSIS — A419 Sepsis, unspecified organism: Secondary | ICD-10-CM | POA: Diagnosis not present

## 2023-06-04 DIAGNOSIS — R6883 Chills (without fever): Secondary | ICD-10-CM | POA: Diagnosis not present

## 2023-06-04 DIAGNOSIS — E785 Hyperlipidemia, unspecified: Secondary | ICD-10-CM

## 2023-06-04 DIAGNOSIS — D649 Anemia, unspecified: Secondary | ICD-10-CM | POA: Diagnosis not present

## 2023-06-04 DIAGNOSIS — Z96652 Presence of left artificial knee joint: Secondary | ICD-10-CM | POA: Diagnosis not present

## 2023-06-04 DIAGNOSIS — T888XXA Other specified complications of surgical and medical care, not elsewhere classified, initial encounter: Secondary | ICD-10-CM

## 2023-06-04 DIAGNOSIS — R5383 Other fatigue: Secondary | ICD-10-CM | POA: Diagnosis not present

## 2023-06-04 LAB — CREATININE, SERUM
Creatinine, Ser: 0.91 mg/dL (ref 0.61–1.24)
GFR, Estimated: >60 mL/min (ref >60)

## 2023-06-04 MED ORDER — APIXABAN 2.5 MG PO TABS: 2.5000 mg | ORAL_TABLET | Freq: Two times a day (BID) | ORAL | 0 refills | Status: DC

## 2023-06-04 MED ORDER — OXYCODONE HCL 5 MG PO TABS: 5.0000 mg | ORAL_TABLET | ORAL | 0 refills | Status: DC | PRN

## 2023-06-04 MED ORDER — ORAL CARE MOUTH RINSE: 15.0000 mL | OROMUCOSAL | Status: DC | PRN

## 2023-06-04 MED ORDER — AMOXICILLIN-POT CLAVULANATE 875-125 MG PO TABS: 1.0000 | ORAL_TABLET | Freq: Two times a day (BID) | ORAL | 0 refills | Status: AC

## 2023-06-04 MED ORDER — LEVOFLOXACIN 500 MG PO TABS: 500.0000 mg | ORAL_TABLET | Freq: Every day | ORAL | 0 refills | Status: DC

## 2023-06-04 NOTE — Discharge Summary (Addendum)
Physician Discharge Summary   Patient: Anthony Bender MRN: [id number] DOB: [birth year]  Admit date:     05/28/2023  Discharge date: 06/04/23  Discharge Physician: Lurene Shadow   PCP: Kennis Carina, MD   Recommendations at discharge:   Follow-up with orthopedic surgeon in 14 days  Discharge Diagnoses: Principal Problem:   Severe sepsis with acute organ dysfunction (HCC) Active Problems:   Status post total knee replacement using cement, right   Elevated troponin   Benign prostatic hyperplasia   Chronic insomnia   COPD (chronic obstructive pulmonary disease) (HCC)   Hyperlipidemia, unspecified   Leukocytosis   Depression  Resolved Problems:   * No resolved hospital problems. *  Hospital Course:  Anthony Bender is a 67 y.o. male with history of depression, anxiety, on anticoagulation with Eliquis, restless leg syndrome, hyperlipidemia, hypertension, insomnia, recent right total knee arthroplasty on 05/19/2023, who presented to the hospital with chills for 3 days and vomiting.  He also complained of sensation of loss of taste.   Lactic acid was initially 2.6 and on repeat was 2.2.  High sensitive troponin was 86 and on repeat was 90.  COVID/influenza A/influenza B/RSV PCR were negative   ED treatment: Vancomycin, cefepime, fentanyl 50 mcg IV one-time dose, lactated ringer 1 L bolus.     He was admitted to the hospital for severe sepsis.    Assessment and Plan:   Severe sepsis, possible septic right knee arthritis, s/p right total knee arthroplasty on 05/19/2023: S/p repeat irrigation and debridement right knee on 06/01/2023.  Leukocytosis has improved.  No growth on wound and blood cultures thus far.  ID recommended 2-week course of Levaquin and Augmentin at discharge.  Analgesia as needed for pain.   Follow-up with orthopedic surgeon at discharge.   PT recommended home health PT.    Elevated troponins: This was attributed to demand ischemia     COPD: Continue  bronchodilators     Other comorbidities include insomnia, depression, hyperlipidemia   His condition has improved and he is deemed stable for discharge to home today.  Discharge plan was discussed with the patient and his wife at the bedside.      Consultants: Orthopedic surgeon, ID specialist Procedures performed: Irrigation and debridement of right knee on 06/01/2023   Disposition: Home health Diet recommendation:  Cardiac diet DISCHARGE MEDICATION: Allergies as of 06/04/2023       Reactions   Yellow Jacket Venom [bee Venom] Anaphylaxis, Shortness Of Breath   Codeine Itching   Metaxalone Swelling   Venlafaxine Other (See Comments)   Loss of appetite, jittery   Zyban [bupropion] Other (See Comments)   hallucinations   Fluoxetine Anxiety   anger        Medication List     TAKE these medications    albuterol 108 (90 Base) MCG/ACT inhaler Commonly known as: VENTOLIN HFA Inhale into the lungs every 6 (six) hours as needed for wheezing or shortness of breath.   Albuterol Sulfate 2.5 MG/0.5ML Nebu Inhale 1 each into the lungs every 6 (six) hours as needed (SOB).   amoxicillin-clavulanate 875-125 MG tablet Commonly known as: AUGMENTIN Take 1 tablet by mouth 2 (two) times daily for 14 days.   apixaban 2.5 MG Tabs tablet Commonly known as: Eliquis Take 1 tablet (2.5 mg total) by mouth 2 (two) times daily.   DULoxetine 60 MG capsule Commonly known as: CYMBALTA Take 60 mg by mouth daily.   fluticasone 50 MCG/ACT nasal spray Commonly known as: FLONASE  Place 2 sprays into both nostrils 2 (two) times daily.   levofloxacin 500 MG tablet Commonly known as: Levaquin Take 1 tablet (500 mg total) by mouth daily.   Lyrica 150 MG capsule Generic drug: pregabalin Take 150 mg by mouth 3 (three) times daily.   nortriptyline 10 MG capsule Commonly known as: PAMELOR Take 20 mg by mouth at bedtime.   Omega-3-Acid Eth Est (Dietary) 1 g Caps Take 1 g by mouth 2 (two) times  daily.   omeprazole 20 MG tablet Commonly known as: PriLOSEC OTC Take 2 tablets (40 mg total) by mouth in the morning and at bedtime.   oxyCODONE 5 MG immediate release tablet Commonly known as: Roxicodone Take 1-2 tablets (5-10 mg total) by mouth every 4 (four) hours as needed for moderate pain or severe pain.   potassium chloride SA 20 MEQ tablet Commonly known as: KLOR-CON M Take 20 mEq by mouth 2 (two) times daily.   rOPINIRole 0.5 MG tablet Commonly known as: REQUIP Take 0.5 mg by mouth 4 (four) times daily as needed.   rosuvastatin 10 MG tablet Commonly known as: CRESTOR Take 10 mg by mouth at bedtime.   telmisartan 80 MG tablet Commonly known as: MICARDIS Take 40 mg by mouth daily.   Trelegy Ellipta 100-62.5-25 MCG/ACT Aepb Generic drug: Fluticasone-Umeclidin-Vilant Inhale 1 puff into the lungs daily.   zolpidem 10 MG tablet Commonly known as: AMBIEN Take 0.5 tablets (5 mg total) by mouth at bedtime. PATIENT HAS SUPPLIES        Follow-up Information     Anson Oregon, PA-C Follow up in 14 day(s).   Specialty: Physician Assistant Why: Staple removal and skin check. Contact information: [address] [place] [address] [phone]                Discharge Exam: Filed Weights   05/28/23 1346 05/29/23 0402 06/01/23 1248  Weight: 83.9 kg 74.5 kg 74.5 kg   GEN: NAD SKIN: Warm and dry EYES: No pallor or icterus ENT: MMM CV: RRR PULM: CTA B ABD: soft, ND, NT, +BS CNS: AAO x 3, non focal EXT: Dressing on right knee surgical wound is clean, dry and intact.   Condition at discharge: good  The results of significant diagnostics from this hospitalization (including imaging, microbiology, ancillary and laboratory) are listed below for reference.   Imaging Studies: US Venous Img Lower Unilateral Right (DVT)  Result Date: 05/31/2023 CLINICAL DATA:  Right lower extremity pain and edema. Recent right total knee replacement. Evaluate  for DVT. EXAM: RIGHT LOWER EXTREMITY VENOUS DOPPLER ULTRASOUND TECHNIQUE: Gray-scale sonography with graded compression, as well as color Doppler and duplex ultrasound were performed to evaluate the lower extremity deep venous systems from the level of the common femoral vein and including the common femoral, femoral, profunda femoral, popliteal and calf veins including the posterior tibial, peroneal and gastrocnemius veins when visible. The superficial great saphenous vein was also interrogated. Spectral Doppler was utilized to evaluate flow at rest and with distal augmentation maneuvers in the common femoral, femoral and popliteal veins. COMPARISON:  None Available. FINDINGS: Contralateral Common Femoral Vein: Respiratory phasicity is normal and symmetric with the symptomatic side. No evidence of thrombus. Normal compressibility. Common Femoral Vein: No evidence of thrombus. Normal compressibility, respiratory phasicity and response to augmentation. Saphenofemoral Junction: No evidence of thrombus. Normal compressibility and flow on color Doppler imaging. Profunda Femoral Vein: No evidence of thrombus. Normal compressibility and flow on color Doppler imaging. Femoral Vein: No evidence  of thrombus. Normal compressibility, respiratory phasicity and response to augmentation. Popliteal Vein: No evidence of thrombus. Normal compressibility, respiratory phasicity and response to augmentation. Calf Veins: No evidence of thrombus. Normal compressibility and flow on color Doppler imaging. Superficial Great Saphenous Vein: No evidence of thrombus. Normal compressibility. Other Findings:  None. IMPRESSION: No evidence of DVT within the right lower extremity. Electronically Signed   By: Simonne Come M.D.   On: 05/31/2023 16:58   DG Chest 2 View  Result Date: 05/28/2023 CLINICAL DATA:  Shortness of breath EXAM: CHEST - 2 VIEW COMPARISON:  X-ray 12/03/2022 FINDINGS: The heart size and mediastinal contours are within normal  limits. Both lungs are clear. No consolidation, pneumothorax or effusion. No edema. The visualized skeletal structures are unremarkable. Degenerative changes are seen along the spine. IMPRESSION: No acute cardiopulmonary disease Electronically Signed   By: Karen Kays M.D.   On: 05/28/2023 14:57   DG Knee Right Port  Result Date: 05/19/2023 CLINICAL DATA:  Status post total knee replacement, right. EXAM: PORTABLE RIGHT KNEE - 1-2 VIEW COMPARISON:  Preoperative MRI 05/05/2023 FINDINGS: Right knee arthroplasty in expected alignment. No periprosthetic lucency or fracture. There has been patellar resurfacing. Recent postsurgical change includes air and edema in the soft tissues and joint space. Anterior skin staples in place. IMPRESSION: Right knee arthroplasty without immediate postoperative complication. Electronically Signed   By: Narda Rutherford M.D.   On: 05/19/2023 16:36    Microbiology: Results for orders placed or performed during the hospital encounter of 05/28/23  SARS Coronavirus 2 by RT PCR (hospital order, performed in [facility] hospital lab) *cepheid single result test* Anterior Nasal Swab     Status: None   Collection Time: 05/28/23  1:51 PM   Specimen: Anterior Nasal Swab  Result Value Ref Range Status   SARS Coronavirus 2 by RT PCR NEGATIVE NEGATIVE Final    Comment: (NOTE) SARS-CoV-2 target nucleic acids are NOT DETECTED.  The SARS-CoV-2 RNA is generally detectable in upper and lower respiratory specimens during the acute phase of infection. The lowest concentration of SARS-CoV-2 viral copies this assay can detect is 250 copies / mL. A negative result does not preclude SARS-CoV-2 infection and should not be used as the sole basis for treatment or other patient management decisions.  A negative result may occur with improper specimen collection / handling, submission of specimen other than nasopharyngeal swab, presence of viral mutation(s) within the areas targeted by this  assay, and inadequate number of viral copies (<250 copies / mL). A negative result must be combined with clinical observations, patient history, and epidemiological information.  Fact Sheet for Patients:   RoadLapTop.co.za  Fact Sheet for Healthcare Providers: http://kim-miller.com/  This test is not yet approved or  cleared by the [place] FDA and has been authorized for detection and/or diagnosis of SARS-CoV-2 by FDA under an Emergency Use Authorization (EUA).  This EUA will remain in effect (meaning this test can be used) for the duration of the COVID-19 declaration under Section 564(b)(1) of the Act, 21 U.S.C. section 360bbb-3(b)(1), unless the authorization is terminated or revoked sooner.  Performed at [facility], [address] [place], [place] [address]   Resp panel by RT-PCR (RSV, Flu A&B, Covid) Anterior Nasal Swab     Status: None   Collection Time: 05/28/23  5:18 PM   Specimen: Anterior Nasal Swab  Result Value Ref Range Status   SARS Coronavirus 2 by RT PCR NEGATIVE NEGATIVE Final    Comment: (NOTE)  SARS-CoV-2 target nucleic acids are NOT DETECTED.  The SARS-CoV-2 RNA is generally detectable in upper respiratory specimens during the acute phase of infection. The lowest concentration of SARS-CoV-2 viral copies this assay can detect is 138 copies/mL. A negative result does not preclude SARS-Cov-2 infection and should not be used as the sole basis for treatment or other patient management decisions. A negative result may occur with  improper specimen collection/handling, submission of specimen other than nasopharyngeal swab, presence of viral mutation(s) within the areas targeted by this assay, and inadequate number of viral copies(<138 copies/mL). A negative result must be combined with clinical observations, patient history, and epidemiological information. The expected result is Negative.  Fact Sheet  for Patients:  BloggerCourse.com  Fact Sheet for Healthcare Providers:  SeriousBroker.it  This test is no t yet approved or cleared by the [place] FDA and  has been authorized for detection and/or diagnosis of SARS-CoV-2 by FDA under an Emergency Use Authorization (EUA). This EUA will remain  in effect (meaning this test can be used) for the duration of the COVID-19 declaration under Section 564(b)(1) of the Act, 21 U.S.C.section 360bbb-3(b)(1), unless the authorization is terminated  or revoked sooner.       Influenza A by PCR NEGATIVE NEGATIVE Final   Influenza B by PCR NEGATIVE NEGATIVE Final    Comment: (NOTE) The Xpert Xpress SARS-CoV-2/FLU/RSV plus assay is intended as an aid in the diagnosis of influenza from Nasopharyngeal swab specimens and should not be used as a sole basis for treatment. Nasal washings and aspirates are unacceptable for Xpert Xpress SARS-CoV-2/FLU/RSV testing.  Fact Sheet for Patients: BloggerCourse.com  Fact Sheet for Healthcare Providers: SeriousBroker.it  This test is not yet approved or cleared by the [place] FDA and has been authorized for detection and/or diagnosis of SARS-CoV-2 by FDA under an Emergency Use Authorization (EUA). This EUA will remain in effect (meaning this test can be used) for the duration of the COVID-19 declaration under Section 564(b)(1) of the Act, 21 U.S.C. section 360bbb-3(b)(1), unless the authorization is terminated or revoked.     Resp Syncytial Virus by PCR NEGATIVE NEGATIVE Final    Comment: (NOTE) Fact Sheet for Patients: BloggerCourse.com  Fact Sheet for Healthcare Providers: SeriousBroker.it  This test is not yet approved or cleared by the [place] FDA and has been authorized for detection and/or diagnosis of SARS-CoV-2 by FDA under an  Emergency Use Authorization (EUA). This EUA will remain in effect (meaning this test can be used) for the duration of the COVID-19 declaration under Section 564(b)(1) of the Act, 21 U.S.C. section 360bbb-3(b)(1), unless the authorization is terminated or revoked.  Performed at [facility], [address] [place], [place] [address]   Culture, blood (Routine X 2) w Reflex to ID Panel     Status: None   Collection Time: 05/28/23  7:45 PM   Specimen: BLOOD  Result Value Ref Range Status   Specimen Description BLOOD  Final   Special Requests   Final    BOTTLES DRAWN AEROBIC AND ANAEROBIC Blood Culture results may not be optimal due to an inadequate volume of blood received in culture bottles   Culture   Final    NO GROWTH 5 DAYS Performed at [facility], [address] [place], [place] [address]    Report Status 06/02/2023 FINAL  Final  Culture, blood (Routine X 2) w Reflex to ID Panel     Status: None   Collection Time: 05/28/23  7:48 PM  Specimen: BLOOD  Result Value Ref Range Status   Specimen Description BLOOD  Final   Special Requests   Final    BOTTLES DRAWN AEROBIC AND ANAEROBIC Blood Culture results may not be optimal due to an inadequate volume of blood received in culture bottles   Culture   Final    NO GROWTH 5 DAYS Performed at [facility], [address] [place], [place] [address]    Report Status 06/02/2023 FINAL  Final  MRSA Next Gen by PCR, Nasal     Status: None   Collection Time: 05/28/23 11:17 PM   Specimen: Nasal Mucosa; Nasal Swab  Result Value Ref Range Status   MRSA by PCR Next Gen NOT DETECTED NOT DETECTED Final    Comment: (NOTE) The GeneXpert MRSA Assay (FDA approved for NASAL specimens only), is one component of a comprehensive MRSA colonization surveillance program. It is not intended to diagnose MRSA infection nor to guide or monitor treatment for MRSA infections. Test performance is not FDA approved in patients  less than 66 years old. Performed at [facility], [address] [place], [place] [address]   Body fluid culture w Gram Stain     Status: None   Collection Time: 05/29/23  8:08 AM   Specimen: Synovium; Body Fluid  Result Value Ref Range Status   Specimen Description   Final    SYNOVIAL Performed at [facility], [address] [place], [place] [address]    Special Requests   Final    RIGHT KNEE ASPIRATE Performed at [facility], [address] [place], [place] [address]    Gram Stain   Final    ABUNDANT WBC PRESENT,BOTH PMN AND MONONUCLEAR NO ORGANISMS SEEN    Culture   Final    NO GROWTH 3 DAYS Performed at [facility], [address] [place], [place] [address]    Report Status 06/01/2023 FINAL  Final  SARS Coronavirus 2 by RT PCR (hospital order, performed in [facility] hospital lab) *cepheid single result test* Anterior Nasal Swab     Status: None   Collection Time: 06/01/23  8:45 AM   Specimen: Anterior Nasal Swab  Result Value Ref Range Status   SARS Coronavirus 2 by RT PCR NEGATIVE NEGATIVE Final    Comment: (NOTE) SARS-CoV-2 target nucleic acids are NOT DETECTED.  The SARS-CoV-2 RNA is generally detectable in upper and lower respiratory specimens during the acute phase of infection. The lowest concentration of SARS-CoV-2 viral copies this assay can detect is 250 copies / mL. A negative result does not preclude SARS-CoV-2 infection and should not be used as the sole basis for treatment or other patient management decisions.  A negative result may occur with improper specimen collection / handling, submission of specimen other than nasopharyngeal swab, presence of viral mutation(s) within the areas targeted by this assay, and inadequate number of viral copies (<250 copies / mL). A negative result must be combined with clinical observations, patient history, and epidemiological information.  Fact Sheet for Patients:    RoadLapTop.co.za  Fact Sheet for Healthcare Providers: http://kim-miller.com/  This test is not yet approved or  cleared by the [place] FDA and has been authorized for detection and/or diagnosis of SARS-CoV-2 by FDA under an Emergency Use Authorization (EUA).  This EUA will remain in effect (meaning this test can be used) for the duration of the COVID-19 declaration under Section 564(b)(1) of the Act, 21 U.S.C. section 360bbb-3(b)(1), unless the authorization  is terminated or revoked sooner.  Performed at [facility], [address] [place], [place] [address]   Aerobic/Anaerobic Culture w Gram Stain (surgical/deep wound)     Status: None (Preliminary result)   Collection Time: 06/01/23  2:14 PM   Specimen: Wound  Result Value Ref Range Status   Specimen Description   Final    WOUND RIGHT KNEE Performed at [facility], [address] [place], [place] [address]    Special Requests   Final    NONE Performed at [facility], [address] [place], [place] [address]    Gram Stain NO WBC SEEN NO ORGANISMS SEEN   Final   Culture   Final    NO GROWTH 3 DAYS NO ANAEROBES ISOLATED; CULTURE IN PROGRESS FOR 5 DAYS Performed at [facility], [address], [place], [place] [address]    Report Status PENDING  Incomplete  Aerobic/Anaerobic Culture w Gram Stain (surgical/deep wound)     Status: None (Preliminary result)   Collection Time: 06/01/23  2:19 PM   Specimen: Path Tissue  Result Value Ref Range Status   Specimen Description   Final    TISSUE RIGHT KNEE Performed at [facility], [address] [place], [place] [address]    Special Requests   Final    NONE Performed at [facility], [address] [place], [place] [address]    Gram Stain NO WBC SEEN RARE GRAM NEGATIVE RODS   Final   Culture   Final    NO GROWTH 3 DAYS NO ANAEROBES ISOLATED; CULTURE IN PROGRESS FOR 5  DAYS Performed at [facility], [address] [place], [place] [address]    Report Status PENDING  Incomplete    Labs: CBC: Recent Labs  Lab 05/29/23 0538 06/01/23 0844 06/02/23 0509 06/03/23 0410  WBC 13.9* 10.6* 15.1* 11.0*  NEUTROABS  --  7.8*  --   --   HGB 13.6 13.9 12.3* 13.0  HCT 40.0 41.2 36.8* 39.5  MCV 93.9 93.6 94.4 96.1  PLT 315 290 281 251   Basic Metabolic Panel: Recent Labs  Lab 05/29/23 0538 06/01/23 0415 06/02/23 0509 06/03/23 0410 06/04/23 0428  NA 135  --  133*  --   --   K 3.8  --  3.9  --   --   CL 107  --  101  --   --   CO2 23  --  24  --   --   GLUCOSE 127*  --  129*  --   --   BUN 19  --  21  --   --   CREATININE 0.74 0.83 0.79 0.94 0.91  CALCIUM 9.2  --  8.7*  --   --   MG 2.5*  --   --   --   --    Liver Function Tests: Recent Labs  Lab 05/29/23 0538  AST 20  ALT 18  ALKPHOS 69  BILITOT 1.3*  PROT 6.5  ALBUMIN 3.5   CBG: No results for input(s): "GLUCAP" in the last 168 hours.  Discharge time spent: greater than 30 minutes.  Signed: Lurene Shadow, MD [facility] 06/04/2023

## 2023-06-04 NOTE — Progress Notes (Signed)
Date of Admission:  05/28/2023     ID: KELVION SOTIROPOULOS is a 67 y.o. male  Principal Problem:   Severe sepsis with acute organ dysfunction (HCC) Active Problems:   Benign prostatic hyperplasia   Chronic insomnia   COPD (chronic obstructive pulmonary disease) (HCC)   Hyperlipidemia, unspecified   Status post total knee replacement using cement, right   Leukocytosis   Elevated troponin   Depression    Subjective: Pt is doing fine  Medications:   apixaban  2.5 mg Oral BID   docusate sodium  100 mg Oral BID   DULoxetine  60 mg Oral Daily   fluticasone furoate-vilanterol  1 puff Inhalation Daily   And   umeclidinium bromide  1 puff Inhalation Daily   nortriptyline  20 mg Oral QHS   pantoprazole  40 mg Oral BID AC   polyethylene glycol  17 g Oral Daily   pregabalin  150 mg Oral TID   rosuvastatin  10 mg Oral QHS   senna-docusate  1 tablet Oral BID   zolpidem  5 mg Oral QHS    Objective: Vital signs in last 24 hours: Patient Vitals for the past 24 hrs:  BP Temp Pulse Resp SpO2  06/04/23 1529 (!) 156/94 97.7 F (36.5 C) -- 16 99 %  06/04/23 0819 130/71 98.1 F (36.7 C) 82 18 98 %  06/03/23 2323 (!) 146/88 97.6 F (36.4 C) 74 20 99 %       PHYSICAL EXAM:  General: Alert, cooperative, no distress, appears stated age.  Lungs: Clear to auscultation bilaterally. No Wheezing or Rhonchi. No rales. Heart: Regular rate and rhythm, no murmur, rub or gallop. Abdomen: Soft, non-tender,not distended. Bowel sounds normal. No masses Extremities: rt knee bruiisng around Skin: No rashes or lesions. Or bruising Lymph: Cervical, supraclavicular normal. Neurologic: Grossly non-focal  Lab Results    Latest Ref Rng & Units 06/03/2023    4:10 AM 06/02/2023    5:09 AM 06/01/2023    8:44 AM  CBC  WBC 4.0 - 10.5 K/uL 11.0  15.1  10.6   Hemoglobin 13.0 - 17.0 g/dL 29.5  62.1  30.8   Hematocrit 39.0 - 52.0 % 39.5  36.8  41.2   Platelets 150 - 400 K/uL 251  281  290        Latest  Ref Rng & Units 06/04/2023    4:28 AM 06/03/2023    4:10 AM 06/02/2023    5:09 AM  CMP  Glucose 70 - 99 mg/dL   657   BUN 8 - 23 mg/dL   21   Creatinine 8.46 - 1.24 mg/dL 9.62  9.52  8.41   Sodium 135 - 145 mmol/L   133   Potassium 3.5 - 5.1 mmol/L   3.9   Chloride 98 - 111 mmol/L   101   CO2 22 - 32 mmol/L   24   Calcium 8.9 - 10.3 mg/dL   8.7       Microbiology: Knee surgical culture NG so far   Assessment/Plan: 67 year old male presenting with, chills and fatigue following total knee arthroplasty on the right 10 days ago.  Prosthetic joint infection Vs hemarthrosis- favor the latter Leukocytosis resolved Aspirate of the right knee joint was not very significant for septic arthritis Washout was done and cultures were sent Gram-negative rod on the Gram stain  could be an error in reading  especially with no WBCs on the Gram stain.  Culture neg so far  Currently on IV vanco and cefepime  Levaquin 500mg  every day  + augmentin 875mg  BID  for 2 weeks   HLD on rosuvatstain   Anxiety/depression on duloxetine, Nortriptyline and Ambien  Discussed the management with the patient , wife and ortho Pt is being discharged today He will follow with ortho as OP

## 2023-06-04 NOTE — Progress Notes (Signed)
Physical Therapy Treatment Patient Details Name: Anthony Bender MRN: [id number] DOB: 18-Jan-1956 Today's Date: 06/04/2023   History of Present Illness Nayib Hornbaker is a 67yoM presenting c vomiting, abdominal pain, and generalized weakness. Pt admitted for severe sepsis with acute organ dysfunction and s/p formal irrigation and debridement of subacute TKA with polyethylene exchange of his right knee for possible septic arthritis status post his right total knee arthroplasty (8/26). PMH includes R TKA (05/19/23), HLD, HTN, insomnia, depression, anxiety, and COPD.    PT Comments  Pt premedicated for PT session- 6/10 on arrival and 8-9/10 during AMB. ROM tolerance remains severely limited by intense pain. Pt works fervently to achieve heel/toe gait in hallway, but fighting against joint effusion and extension limiters creates a few instances of joint locking with full disruption of gait cycle. Mechanics of knee and fluid gait improve over final 167ft AMB. Ultimately pt completed >237ft AMB without any significant LOB. Pt endorses readiness for DC mobility needs at home, we discussed gradual transition back to HEP as symptoms continue to improve.    If plan is discharge home, recommend the following: A little help with walking and/or transfers;A little help with bathing/dressing/bathroom;Help with stairs or ramp for entrance;Assist for transportation;Assistance with cooking/housework;Direct supervision/assist for medications management   Can travel by private vehicle        Equipment Recommendations  None recommended by PT    Recommendations for Other Services       Precautions / Restrictions Precautions Precautions: Knee;Fall Restrictions RLE Weight Bearing: Weight bearing as tolerated     Mobility  Bed Mobility               General bed mobility comments: in recliner on entry    Transfers Overall transfer level: Needs assistance Equipment used: Rolling walker (2  wheels) Transfers: Sit to/from Stand Sit to Stand: Contact guard assist           General transfer comment: from recliner    Ambulation/Gait Ambulation/Gait assistance: Contact guard assist Gait Distance (Feet): 260 Feet Assistive device: Rolling walker (2 wheels) Gait Pattern/deviations: Step-through pattern Gait velocity: increased     General Gait Details: step-to for half, then step=-through once knee loosened up and allowed for a bit more extension ROM   Stairs Stairs:  (pt declines additional stairs practice)           Wheelchair Mobility     Tilt Bed    Modified Rankin (Stroke Patients Only)       Balance                                            Cognition Arousal: Alert Behavior During Therapy: WFL for tasks assessed/performed Overall Cognitive Status: Within Functional Limits for tasks assessed                                          Exercises Total Joint Exercises Heel Slides: AAROM, AROM, Right, 15 reps, Seated, Limitations Heel Slides Limitations: tolerated ROM (ended up being about 15-20)    General Comments        Pertinent Vitals/Pain Pain Assessment Pain Assessment: 0-10 Pain Score: 8  (8-9/10 walking)    Home Living  Prior Function            PT Goals (current goals can now be found in the care plan section) Acute Rehab PT Goals Patient Stated Goal: return home PT Goal Formulation: With patient Time For Goal Achievement: 06/16/23 Potential to Achieve Goals: Good Progress towards PT goals: Progressing toward goals    Frequency    7X/week      PT Plan      Co-evaluation              AM-PAC PT "6 Clicks" Mobility   Outcome Measure  Help needed turning from your back to your side while in a flat bed without using bedrails?: None Help needed moving from lying on your back to sitting on the side of a flat bed without using bedrails?:  None Help needed moving to and from a bed to a chair (including a wheelchair)?: A Little Help needed standing up from a chair using your arms (e.g., wheelchair or bedside chair)?: A Little Help needed to walk in hospital room?: A Little Help needed climbing 3-5 steps with a railing? : A Little 6 Click Score: 20    End of Session Equipment Utilized During Treatment: Gait belt Activity Tolerance: Patient tolerated treatment well;Patient limited by pain Patient left: in chair;with family/visitor present;with call bell/phone within reach Nurse Communication: Mobility status;Patient requests pain meds PT Visit Diagnosis: Other abnormalities of gait and mobility (R26.89);Muscle weakness (generalized) (M62.81);Difficulty in walking, not elsewhere classified (R26.2)     Time: 2130-8657 PT Time Calculation (min) (ACUTE ONLY): 14 min  Charges:    $Therapeutic Activity: 8-22 mins PT General Charges $$ ACUTE PT VISIT: 1 Visit                    2:48 PM, 06/04/23 Rosamaria Lints, PT, DPT Physical Therapist - [facility]  [phone] (ASCOM)    Noheli Melder C 06/04/2023, 2:46 PM

## 2023-06-04 NOTE — Progress Notes (Signed)
1650 D/C AVS completed and reviewed with pt. All opportunities for questions answered and clarified. IV removed. Pt will be wheeled down to car at medical mall entrance via wheelchair.

## 2023-06-04 NOTE — Discharge Instructions (Signed)

## 2023-06-15 LAB — AEROBIC/ANAEROBIC CULTURE W GRAM STAIN (SURGICAL/DEEP WOUND)
Culture: NO GROWTH
Culture: NO GROWTH
Gram Stain: NONE SEEN
Gram Stain: NONE SEEN

## 2023-06-18 ENCOUNTER — Emergency Department: Payer: No Typology Code available for payment source

## 2023-06-18 ENCOUNTER — Observation Stay
Admission: EM | Admit: 2023-06-18 | Discharge: 2023-06-19 | Disposition: A | Discharge status: Home / Self Care | Payer: No Typology Code available for payment source | Attending: Internal Medicine | Admitting: Internal Medicine

## 2023-06-18 ENCOUNTER — Other Ambulatory Visit: Payer: Self-pay

## 2023-06-18 DIAGNOSIS — F17210 Nicotine dependence, cigarettes, uncomplicated: Secondary | ICD-10-CM | POA: Diagnosis not present

## 2023-06-18 DIAGNOSIS — E872 Acidosis, unspecified: Secondary | ICD-10-CM | POA: Diagnosis not present

## 2023-06-18 DIAGNOSIS — R0602 Shortness of breath: Secondary | ICD-10-CM | POA: Diagnosis present

## 2023-06-18 DIAGNOSIS — J9601 Acute respiratory failure with hypoxia: Secondary | ICD-10-CM | POA: Diagnosis not present

## 2023-06-18 DIAGNOSIS — I959 Hypotension, unspecified: Secondary | ICD-10-CM | POA: Diagnosis not present

## 2023-06-18 DIAGNOSIS — K2270 Barrett's esophagus without dysplasia: Secondary | ICD-10-CM | POA: Diagnosis present

## 2023-06-18 DIAGNOSIS — I10 Essential (primary) hypertension: Secondary | ICD-10-CM | POA: Insufficient documentation

## 2023-06-18 DIAGNOSIS — A419 Sepsis, unspecified organism: Principal | ICD-10-CM

## 2023-06-18 DIAGNOSIS — I70211 Atherosclerosis of native arteries of extremities with intermittent claudication, right leg: Secondary | ICD-10-CM | POA: Diagnosis not present

## 2023-06-18 DIAGNOSIS — N400 Enlarged prostate without lower urinary tract symptoms: Secondary | ICD-10-CM | POA: Diagnosis present

## 2023-06-18 DIAGNOSIS — J069 Acute upper respiratory infection, unspecified: Secondary | ICD-10-CM

## 2023-06-18 DIAGNOSIS — J449 Chronic obstructive pulmonary disease, unspecified: Secondary | ICD-10-CM | POA: Diagnosis present

## 2023-06-18 DIAGNOSIS — M25561 Pain in right knee: Secondary | ICD-10-CM

## 2023-06-18 DIAGNOSIS — Z1152 Encounter for screening for COVID-19: Secondary | ICD-10-CM | POA: Insufficient documentation

## 2023-06-18 DIAGNOSIS — R0989 Other specified symptoms and signs involving the circulatory and respiratory systems: Secondary | ICD-10-CM | POA: Diagnosis not present

## 2023-06-18 DIAGNOSIS — J45909 Unspecified asthma, uncomplicated: Secondary | ICD-10-CM | POA: Insufficient documentation

## 2023-06-18 DIAGNOSIS — K259 Gastric ulcer, unspecified as acute or chronic, without hemorrhage or perforation: Secondary | ICD-10-CM | POA: Diagnosis not present

## 2023-06-18 DIAGNOSIS — R350 Frequency of micturition: Secondary | ICD-10-CM

## 2023-06-18 DIAGNOSIS — Z7901 Long term (current) use of anticoagulants: Secondary | ICD-10-CM | POA: Diagnosis not present

## 2023-06-18 DIAGNOSIS — E861 Hypovolemia: Secondary | ICD-10-CM | POA: Diagnosis present

## 2023-06-18 DIAGNOSIS — Z96651 Presence of right artificial knee joint: Secondary | ICD-10-CM | POA: Diagnosis not present

## 2023-06-18 DIAGNOSIS — Z89422 Acquired absence of other left toe(s): Secondary | ICD-10-CM | POA: Insufficient documentation

## 2023-06-18 DIAGNOSIS — K922 Gastrointestinal hemorrhage, unspecified: Secondary | ICD-10-CM | POA: Diagnosis present

## 2023-06-18 DIAGNOSIS — Z79899 Other long term (current) drug therapy: Secondary | ICD-10-CM | POA: Diagnosis not present

## 2023-06-18 DIAGNOSIS — I739 Peripheral vascular disease, unspecified: Secondary | ICD-10-CM | POA: Diagnosis present

## 2023-06-18 LAB — CBC WITH DIFFERENTIAL/PLATELET
Abs Immature Granulocytes: 0.05 10*3/uL (ref 0.00–0.07)
Basophils Absolute: 0.1 10*3/uL (ref 0.0–0.1)
Basophils Relative: 1 %
Eosinophils Absolute: 0.1 10*3/uL (ref 0.0–0.5)
Eosinophils Relative: 1 %
HCT: 40.8 % (ref 39.0–52.0)
Hemoglobin: 14 g/dL (ref 13.0–17.0)
Immature Granulocytes: 1 %
Lymphocytes Relative: 15 %
Lymphs Abs: 1 10*3/uL (ref 0.7–4.0)
MCH: 32 pg (ref 26.0–34.0)
MCHC: 34.3 g/dL (ref 30.0–36.0)
MCV: 93.2 fL (ref 80.0–100.0)
Monocytes Absolute: 1 10*3/uL (ref 0.1–1.0)
Monocytes Relative: 15 %
Neutro Abs: 4.8 10*3/uL (ref 1.7–7.7)
Neutrophils Relative %: 67 %
Platelets: 274 10*3/uL (ref 150–400)
RBC: 4.38 MIL/uL (ref 4.22–5.81)
RDW: 14.8 % (ref 11.5–15.5)
WBC: 7.1 10*3/uL (ref 4.0–10.5)
nRBC: 0 % (ref 0.0–0.2)

## 2023-06-18 LAB — COMPREHENSIVE METABOLIC PANEL
ALT: 14 U/L (ref 0–44)
AST: 23 U/L (ref 15–41)
Albumin: 3.5 g/dL (ref 3.5–5.0)
Alkaline Phosphatase: 103 U/L (ref 38–126)
Anion gap: 12 (ref 5–15)
BUN: 8 mg/dL (ref 8–23)
CO2: 22 mmol/L (ref 22–32)
Calcium: 8.9 mg/dL (ref 8.9–10.3)
Chloride: 99 mmol/L (ref 98–111)
Creatinine, Ser: 0.87 mg/dL (ref 0.61–1.24)
GFR, Estimated: >60 mL/min (ref >60)
Glucose, Bld: 144 mg/dL — ABNORMAL HIGH (ref 70–99)
Potassium: 3.5 mmol/L (ref 3.5–5.1)
Sodium: 133 mmol/L — ABNORMAL LOW (ref 135–145)
Total Bilirubin: 1.1 mg/dL (ref 0.3–1.2)
Total Protein: 6.7 g/dL (ref 6.5–8.1)

## 2023-06-18 LAB — URINALYSIS, W/ REFLEX TO CULTURE (INFECTION SUSPECTED)
Bacteria, UA: NONE SEEN
Bilirubin Urine: NEGATIVE
Glucose, UA: NEGATIVE mg/dL
Hgb urine dipstick: NEGATIVE
Ketones, ur: NEGATIVE mg/dL
Leukocytes,Ua: NEGATIVE
Nitrite: NEGATIVE
Protein, ur: NEGATIVE mg/dL
Specific Gravity, Urine: 1.008 (ref 1.005–1.030)
pH: 8 (ref 5.0–8.0)

## 2023-06-18 LAB — TROPONIN I (HIGH SENSITIVITY)
Troponin I (High Sensitivity): 35 ng/L — ABNORMAL HIGH (ref <18)
Troponin I (High Sensitivity): 47 ng/L — ABNORMAL HIGH (ref <18)

## 2023-06-18 LAB — LACTIC ACID, PLASMA
Lactic Acid, Venous: 3.2 mmol/L (ref 0.5–1.9)
Lactic Acid, Venous: 1.1 mmol/L (ref 0.5–1.9)

## 2023-06-18 LAB — APTT: aPTT: 33 s (ref 24–36)

## 2023-06-18 LAB — RESP PANEL BY RT-PCR (RSV, FLU A&B, COVID)  RVPGX2
Influenza A by PCR: NEGATIVE
Influenza B by PCR: NEGATIVE
Resp Syncytial Virus by PCR: NEGATIVE
SARS Coronavirus 2 by RT PCR: NEGATIVE

## 2023-06-18 LAB — PROTIME-INR
INR: 1.1 (ref 0.8–1.2)
Prothrombin Time: 14.4 s (ref 11.4–15.2)

## 2023-06-18 MED ORDER — LACTATED RINGERS IV BOLUS (SEPSIS)
1000.0000 mL | Freq: Once | INTRAVENOUS | Status: AC
  Administered 2023-06-18: 1000 mL via INTRAVENOUS

## 2023-06-18 MED ORDER — SODIUM CHLORIDE 0.9 % IV SOLN
2.0000 g | Freq: Once | INTRAVENOUS | Status: AC
  Administered 2023-06-18: 2 g via INTRAVENOUS
  Filled 2023-06-18: qty 12.5

## 2023-06-18 MED ORDER — VANCOMYCIN HCL IN DEXTROSE 1-5 GM/200ML-% IV SOLN
1000.0000 mg | Freq: Once | INTRAVENOUS | Status: AC
  Administered 2023-06-18: 1000 mg via INTRAVENOUS
  Filled 2023-06-18: qty 200

## 2023-06-18 MED ORDER — VANCOMYCIN HCL 750 MG/150ML IV SOLN
750.0000 mg | Freq: Once | INTRAVENOUS | Status: AC
  Administered 2023-06-18: 750 mg via INTRAVENOUS
  Filled 2023-06-18: qty 150

## 2023-06-18 NOTE — Progress Notes (Signed)
PHARMACY -  BRIEF ANTIBIOTIC NOTE   Pharmacy has received consult(s) for Vancomycin from an ED provider.  The patient's profile has been reviewed for ht/wt/allergies/indication/available labs.    One time order(s) placed for Vancomycin 1750 mg IV.  Further antibiotics/pharmacy consults should be ordered by admitting physician if indicated.                       Thank you, Jerrilyn Cairo, PharmD 06/18/2023 9:17 PM

## 2023-06-18 NOTE — H&P (Signed)
History and Physical    Patient: Anthony Bender VQQ:[id number] DOB: May 18, 1956 DOA: 06/18/2023 DOS: the patient was seen and examined on 06/19/2023 PCP: Kennis Carina, MD  Patient coming from: Home   Chief Complaint:  Chief Complaint  Patient presents with   Shortness of Breath    HPI: Anthony Bender is a 67 y.o. 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  Chart review shows the patient was admitted for GI bleed around earlier part of this year, February 2024 patient had gastric ulcers on endoscopy.  Patient was seen by orthopedics on May 19, 2023 to undergo right total knee arthroplasty.  Patient's had procedures for his torn cartilage in the past.  He is also had a left knee replacement in 2016.  Patient was discharged on August 14th after total knee replacement using cement.  Patient was restarted on Eliquis 2.5 mg every 12 for DVT prophylaxis. On August 22 patient was seen in the emergency room for chills.  He started having chills on August 18.  Patient reported decreased appetite.  Some epigastric abdominal pain. Contacted Dr. Allena Katz orthopedics about additional recommendations being to patient's case as he is postop with a high index of suspicion for a knee joint infection.  Dr. Allena Katz feels confident that patient does not have an infection of the knee is highly unlikely as patient had antibiotics during surgery and has been on p.o. antibiotics.  We we will therefore cover with broad-spectrum and seek ID consult.  We appreciate orthopedics consult.     Patient was admitted on 26 August for a joint open irrigation with debridement for suspected septic arthritis status post right total knee arthroplasty.  Wound cultures were negative at that time with no growth and ID at that time recommended 2 weeks of Levaquin and Augmentin.   Review of Systems: Review of Systems  Constitutional:  Positive for chills and fever.  Musculoskeletal:  Positive for joint pain.    Past Medical History:  Diagnosis Date   Anxiety    Aortic ectasia (HCC)    Asthma    Barrett's esophagus    BPH (benign prostatic hyperplasia)    COPD (chronic obstructive pulmonary disease) (HCC)    DDD (degenerative disc disease), lumbar    GERD (gastroesophageal reflux disease)    Headache    Hemorrhoids    Hyperlipidemia    Hyperlipidemia    Hypertension    Insomnia    Mild episode of recurrent major depressive disorder (HCC)    Neuropathy    Neuropathy, peripheral, idiopathic    Osteoarthritis    Primary osteoarthritis of right knee    Smoker    Tobacco use    Umbilical hernia    Vitamin D deficiency    Past Surgical History:  Procedure Laterality Date   AMPUTATION TOE Left 09/06/2022   Procedure: AMPUTATION TOE;  Surgeon: Gwyneth Revels, DPM;  Location: [facility] ORS;  Service: Podiatry;  Laterality: Left;   APPENDECTOMY     BACK SURGERY     injection   ESOPHAGOGASTRODUODENOSCOPY N/A 03/15/2015   Procedure: ESOPHAGOGASTRODUODENOSCOPY (EGD);  Surgeon: Wallace Cullens, MD;  Location:  [facility] ENDOSCOPY;  Service: Gastroenterology;  Laterality: N/A;   ESOPHAGOGASTRODUODENOSCOPY (EGD) WITH PROPOFOL N/A 08/23/2015   Procedure: ESOPHAGOGASTRODUODENOSCOPY (EGD) WITH PROPOFOL;  Surgeon: Wallace Cullens, MD;  Location: [facility] ENDOSCOPY;  Service: Gastroenterology;  Laterality: N/A;   Barrx Procedure   ESOPHAGOGASTRODUODENOSCOPY (EGD) WITH PROPOFOL N/A 01/24/2016   Procedure: ESOPHAGOGASTRODUODENOSCOPY (EGD) WITH PROPOFOL;  Surgeon: Wallace Cullens, MD;  Location: [facility] ENDOSCOPY;  Service:  Gastroenterology;  Laterality: N/A;   ESOPHAGOGASTRODUODENOSCOPY (EGD) WITH PROPOFOL N/A 12/03/2022   Procedure: ESOPHAGOGASTRODUODENOSCOPY (EGD) WITH PROPOFOL;  Surgeon: Wyline Mood, MD;  Location: [facility] ENDOSCOPY;  Service: Gastroenterology;  Laterality: N/A;   HERNIA REPAIR     JOINT REPLACEMENT Left    Partial Knee Replacement   KNEE ARTHROSCOPY     PAROTIDECTOMY Left 06/30/2017   Procedure: PAROTIDECTOMY;  Surgeon: Linus Salmons, MD;  Location: [facility] ORS;  Service: ENT;  Laterality: Left;   reattachment of finger Left    little finger   salvia gland tumors removed     SUPERFICIAL PERONEAL NERVE RELEASE     TOTAL KNEE ARTHROPLASTY Right 05/19/2023   Procedure: TOTAL KNEE ARTHROPLASTY;  Surgeon: Christena Flake, MD;  Location: [facility] ORS;  Service: Orthopedics;  Laterality: Right;   TOTAL KNEE REVISION Right 06/01/2023   Procedure: TOTAL KNEE REVISION (POLY EXCHANGE) WASH OUT;  Surgeon: Christena Flake, MD;  Location: [facility] ORS;  Service: Orthopedics;  Laterality: Right;   Social History:   reports that he has been smoking cigarettes. He has a 88.5 pack-year smoking history. He has never used smokeless tobacco. He reports current alcohol use. He reports current drug use. Drug: Marijuana.  Allergies  Allergen Reactions   Yellow Jacket Venom [Bee Venom] Anaphylaxis and Shortness Of Breath   Codeine Itching   Metaxalone Swelling   Venlafaxine Other (See Comments)    Loss of appetite, jittery   Zyban [Bupropion] Other (See Comments)    hallucinations   Fluoxetine Anxiety    anger    Family History  Problem Relation Age of Onset   Diabetes Mother    Hypertension Mother    Hypertension Father     Prior to Admission medications   Medication Sig Start Date End Date Taking? Authorizing Provider  albuterol (PROVENTIL HFA;VENTOLIN HFA) 108 (90 Base) MCG/ACT inhaler Inhale into the lungs every 6 (six) hours as needed for wheezing or shortness of breath.    [provider]  Albuterol  Sulfate 2.5 MG/0.5ML NEBU Inhale 1 each into the lungs every 6 (six) hours as needed (SOB).    [provider]  amoxicillin-clavulanate (AUGMENTIN) 875-125 MG tablet Take 1 tablet by mouth 2 (two) times daily for 14 days. 06/04/23 06/18/23  Anson Oregon, PA-C  apixaban (ELIQUIS) 2.5 MG TABS tablet Take 1 tablet (2.5 mg total) by mouth 2 (two) times daily. 06/04/23   Anson Oregon, PA-C  DULoxetine (CYMBALTA) 60 MG capsule Take 60 mg by mouth daily. 11/07/22 11/02/23  [provider]  fluticasone (FLONASE) 50 MCG/ACT nasal spray Place 2 sprays into both nostrils 2 (two) times daily.  01/02/15   [provider]  levofloxacin (LEVAQUIN) 500 MG tablet Take 1 tablet (500 mg total) by mouth daily. 06/04/23   Anson Oregon, PA-C  LYRICA 150 MG capsule Take 150 mg by mouth 3 (three) times daily.  01/02/15   [provider]  nortriptyline (PAMELOR) 10 MG capsule Take 20 mg by mouth at bedtime. 04/23/22   [provider]  Omega-3-Acid Eth Est, Dietary, 1 g  CAPS Take 1 g by mouth 2 (two) times daily. Patient not taking: Reported on 05/28/2023    [provider]  omeprazole (PRILOSEC OTC) 20 MG tablet Take 2 tablets (40 mg total) by mouth in the morning and at bedtime. 12/04/22 12/04/23  Wouk, Wilfred Curtis, MD  oxyCODONE (ROXICODONE) 5 MG immediate release tablet Take 1-2 tablets (5-10 mg total) by mouth every 4 (four) hours as needed for moderate pain or severe pain. 06/04/23   Anson Oregon, PA-C  potassium chloride SA (KLOR-CON M) 20 MEQ tablet Take 20 mEq by mouth 2 (two) times daily. Patient not taking: Reported on 05/28/2023    [provider]  rOPINIRole (REQUIP) 0.5 MG tablet Take 0.5 mg by mouth 4 (four) times daily as needed.    [provider]  rosuvastatin (CRESTOR) 10 MG tablet Take 10 mg by mouth at bedtime.    [provider]  telmisartan (MICARDIS) 80 MG tablet Take 40 mg by mouth daily. 06/03/21   [provider]  TRELEGY ELLIPTA 100-62.5-25 MCG/ACT AEPB Inhale 1 puff into the lungs daily.    [provider]  zolpidem (AMBIEN) 10 MG tablet Take 0.5 tablets (5 mg total) by mouth at bedtime. PATIENT HAS SUPPLIES 01/12/18   Jomarie Longs, MD     Vitals:   06/18/23 2300 06/19/23 0000 06/19/23 0030 06/19/23 0215  BP: 121/78 118/77 104/69 114/71  Pulse: 98 88 90 90  Resp: 20 (!) 21 16 17   Temp:    97.8 F (36.6 C)  TempSrc:      SpO2: 95% 98% 98% 93%  Weight:      Height:       Physical Exam Vitals and nursing note reviewed.  Constitutional:      General: He is not in acute distress. HENT:     Head: Normocephalic and atraumatic.     Right Ear: Hearing normal.     Left Ear: Hearing normal.     Nose: Nose normal. No nasal deformity.     Mouth/Throat:     Lips: Pink.     Tongue: No lesions.     Pharynx: Oropharynx is clear.  Eyes:     General: Lids are normal.     Extraocular Movements: Extraocular movements intact.  Cardiovascular:     Rate and Rhythm: Normal rate and regular rhythm.     Heart sounds: Normal heart sounds.  Pulmonary:     Effort: Pulmonary effort is normal.     Breath sounds: Normal breath sounds.  Abdominal:     General: Bowel sounds are normal. There is no distension.     Palpations: Abdomen is soft. There is no mass.     Tenderness: There is no abdominal tenderness.  Musculoskeletal:     Right lower leg: No edema.     Left lower leg: No edema.  Skin:    General: Skin is warm.  Neurological:     General: No focal deficit present.     Mental Status: He is alert and oriented to person, place, and time.     Cranial Nerves: Cranial nerves 2-12 are intact.  Psychiatric:        Attention and Perception: Attention normal.        Mood and Affect: Mood normal.        Speech: Speech normal.        Behavior: Behavior normal. Behavior is cooperative.      Labs on Admission: I have personally reviewed following labs  and imaging  studies  CBC: Recent Labs  Lab 06/18/23 2113  WBC 7.1  NEUTROABS 4.8  HGB 14.0  HCT 40.8  MCV 93.2  PLT 274   Basic Metabolic Panel: Recent Labs  Lab 06/18/23 2113  NA 133*  K 3.5  CL 99  CO2 22  GLUCOSE 144*  BUN 8  CREATININE 0.87  CALCIUM 8.9   GFR: Estimated Creatinine Clearance: 89.2 mL/min (by C-G formula based on SCr of 0.87 mg/dL). Liver Function Tests: Recent Labs  Lab 06/18/23 2113  AST 23  ALT 14  ALKPHOS 103  BILITOT 1.1  PROT 6.7  ALBUMIN 3.5   No results for input(s): "LIPASE", "AMYLASE" in the last 168 hours. No results for input(s): "AMMONIA" in the last 168 hours. Coagulation Profile: Recent Labs  Lab 06/18/23 2113  INR 1.1   Cardiac Enzymes: No results for input(s): "CKTOTAL", "CKMB", "CKMBINDEX", "TROPONINI" in the last 168 hours. BNP (last 3 results) No results for input(s): "PROBNP" in the last 8760 hours. HbA1C: No results for input(s): "HGBA1C" in the last 72 hours. CBG: No results for input(s): "GLUCAP" in the last 168 hours. Lipid Profile: No results for input(s): "CHOL", "HDL", "LDLCALC", "TRIG", "CHOLHDL", "LDLDIRECT" in the last 72 hours. Thyroid Function Tests: No results for input(s): "TSH", "T4TOTAL", "FREET4", "T3FREE", "THYROIDAB" in the last 72 hours. Anemia Panel: No results for input(s): "VITAMINB12", "FOLATE", "FERRITIN", "TIBC", "IRON", "RETICCTPCT" in the last 72 hours. Urinalysis    Component Value Date/Time   COLORURINE YELLOW (A) 06/18/2023 2137   APPEARANCEUR CLEAR (A) 06/18/2023 2137   APPEARANCEUR Clear 07/22/2014 1144   LABSPEC 1.008 06/18/2023 2137   LABSPEC 1.005 07/22/2014 1144   PHURINE 8.0 06/18/2023 2137   GLUCOSEU NEGATIVE 06/18/2023 2137   GLUCOSEU Negative 07/22/2014 1144   HGBUR NEGATIVE 06/18/2023 2137   BILIRUBINUR NEGATIVE 06/18/2023 2137   BILIRUBINUR Negative 07/22/2014 1144   KETONESUR NEGATIVE 06/18/2023 2137   PROTEINUR NEGATIVE 06/18/2023 2137   NITRITE NEGATIVE 06/18/2023  2137   LEUKOCYTESUR NEGATIVE 06/18/2023 2137   LEUKOCYTESUR Negative 07/22/2014 1144   Unresulted Labs (From admission, onward)     Start     Ordered   06/19/23 0500  Comprehensive metabolic panel  Tomorrow morning,   R        06/19/23 0102   06/19/23 0500  CBC  Tomorrow morning,   R        06/19/23 0102   06/19/23 0204  D-dimer, quantitative  ONCE - STAT,   STAT        06/19/23 0203   06/19/23 0204  CK  Add-on,   AD        06/19/23 0203   06/18/23 2113  Blood Culture (routine x 2)  (Undifferentiated presentation (screening labs and basic nursing orders))  BLOOD CULTURE X 2,   STAT      06/18/23 2113            Medications  sodium chloride flush (NS) 0.9 % injection 3 mL (3 mLs Intravenous Given 06/19/23 0304)  acetaminophen (TYLENOL) tablet 650 mg (has no administration in time range)    Or  acetaminophen (TYLENOL) suppository 650 mg (has no administration in time range)  nicotine (NICODERM CQ - dosed in mg/24 hours) patch 21 mg (21 mg Transdermal Patch Applied 06/19/23 0308)  albuterol (PROVENTIL) (2.5 MG/3ML) 0.083% nebulizer solution 2.5 mg (has no administration in time range)  apixaban (ELIQUIS) tablet 2.5 mg (has no administration in time range)  DULoxetine (CYMBALTA) DR capsule 30  mg (has no administration in time range)  fluticasone (FLONASE) 50 MCG/ACT nasal spray 2 spray (has no administration in time range)  pregabalin (LYRICA) capsule 150 mg (has no administration in time range)  nortriptyline (PAMELOR) capsule 20 mg (has no administration in time range)  rosuvastatin (CRESTOR) tablet 10 mg (has no administration in time range)  oxyCODONE (Oxy IR/ROXICODONE) immediate release tablet 5-10 mg (has no administration in time range)  lactated ringers bolus 1,000 mL (0 mLs Intravenous Stopped 06/18/23 2202)  ceFEPIme (MAXIPIME) 2 g in sodium chloride 0.9 % 100 mL IVPB (0 g Intravenous Stopped 06/18/23 2202)  vancomycin (VANCOCIN) IVPB 1000 mg/200 mL premix (0 mg Intravenous  Stopped 06/18/23 2244)    Followed by  vancomycin (VANCOREADY) IVPB 750 mg/150 mL (0 mg Intravenous Stopped 06/18/23 2244)  lactated ringers infusion ( Intravenous New Bag/Given 06/19/23 0305)    Radiological Exams on Admission: DG Knee Complete 4 Views Right  Result Date: 06/18/2023 CLINICAL DATA:  Knee pain EXAM: RIGHT KNEE - COMPLETE 4+ VIEW COMPARISON:  Right knee x-ray 05/19/2023 FINDINGS: Right knee arthroplasty appears in anatomic alignment. There is no evidence for hardware loosening or acute fracture. Anterior skin staples and soft tissue swelling are compatible with recent surgery. Soft tissue air has resolved in the interval. Joint effusion is present. IMPRESSION: 1. Right knee arthroplasty without complicating features. 2. Joint effusion. Electronically Signed   By: Darliss Cheney M.D.   On: 06/18/2023 23:25   DG Chest Port 1 View  Result Date: 06/18/2023 CLINICAL DATA:  Questionable sepsis EXAM: PORTABLE CHEST 1 VIEW COMPARISON:  Chest x-ray 05/28/2023 FINDINGS: The heart size and mediastinal contours are within normal limits. Both lungs are clear. The visualized skeletal structures are unremarkable. IMPRESSION: No active disease. Electronically Signed   By: Darliss Cheney M.D.   On: 06/18/2023 22:32     Data Reviewed: Relevant notes from primary care and specialist visits, past discharge summaries as available in EHR, including Care Everywhere. Prior diagnostic testing as pertinent to current admission diagnoses Updated medications and problem lists for reconciliation ED course, including vitals, labs, imaging, treatment and response to treatment Triage notes, nursing and pharmacy notes and ED provider's notes Notable results as noted in HPI  Assessment and Plan: * Acute respiratory failure with hypoxia (HCC) Initial vitals shows hypotension and hypoxia, pt was 80% and BP of 80/60. Initial presentation is not clear as we do not have chest xray . Pt was on 6 L initially and o2  need resolved when I was at bedside pt was on RA. Pt was given LR 1L. No intake/output data recorded. No intake/output data recorded. Pt was treated as sepsis and septic shock with BS iv abx.  Chest xray ordered and pending.    Status post total knee replacement using cement, right Cont iv abx and orthopedic is consulted.  We will follow mri.    Gastric ulcer IV PPI. F/u with GI as outpatient as this is new earlier this year.   Barrett's esophagus IV ppi.   COPD (chronic obstructive pulmonary disease) (HCC) Stable. No wheezing. Cont prn albuterol and or duonebs as needed. Chest xray is pending.  Low threshold for CT chest pt is on low dose eliquis.   Absent pedal pulses Pt has doppler pulses in right foot. Cont eliquis 2.5. pt needs CTA aorta with runoff studies I could not feel popliteal or femoral pulses. Suspect pt has severe PAD.   Claudication of right lower extremity (HCC) Pt has symptoms of cramping  and pain with walking.  ABI ordered.   Hypovolemia Vitals:   06/18/23 2112 06/18/23 2200 06/18/23 2300 06/19/23 0000  BP: 121/83 131/85 121/78 118/77   06/19/23 0030 06/19/23 0215  BP: 104/69 114/71  Stable. Cultures collected.  Hold  micardis  / cymbalta dose reduced to prevent hypotension.     Lactic acidosis Suspect 2/2 to hypovolemia and hypoperfusion or sepsis.  Source unclear we will get MRI of right knee.  Cont with MIVF..    DVT prophylaxis:  Eliquis   Consults:  Orthopedic: Dr.Larya Charpentier   Advance Care Planning:    Code Status: Full Code   Family Communication:  Wife at bedside.   Disposition Plan:  Home   Severity of Illness: The appropriate patient status for this patient is INPATIENT. Inpatient status is judged to be reasonable and necessary in order to provide the required intensity of service to ensure the patient's safety. The patient's presenting symptoms, physical exam findings, and initial radiographic and laboratory data in the  context of their chronic comorbidities is felt to place them at high risk for further clinical deterioration. Furthermore, it is not anticipated that the patient will be medically stable for discharge from the hospital within 2 midnights of admission.   * I certify that at the point of admission it is my clinical judgment that the patient will require inpatient hospital care spanning beyond 2 midnights from the point of admission due to high intensity of service, high risk for further deterioration and high frequency of surveillance required.*  Author: Gertha Calkin, MD 06/19/2023 3:29 AM  For on call review [email or web address].

## 2023-06-18 NOTE — ED Triage Notes (Signed)
Pt to ED via EMS from home, pt had total right knee replacement aprox 1 month ago and 2 weeks ago went back and had it "cleaned out" Per ems tonight pt called out for breathing difficulty, pts initial BP 80/60, pt was 80% room air co2 17. Pt was placed on 6L [place] and sats improved to upper 90s. Pt denies complaints other than feeling short of breath. Incision site clean and dry

## 2023-06-18 NOTE — ED Provider Notes (Signed)
[facility] Provider Note    Event Date/Time   First MD Initiated Contact with Patient 06/18/23 2110     (approximate)   History   Shortness of Breath   HPI  Anthony Bender is a 67 y.o. 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  He has had chills/rigors, as well as upper respiratory tract infectious symptoms like sore throat/congestion/mild cough starting today.  EMS reports that he was hypotensive 80s over 60s but responded after 1200 cc of fluids to become normotensive upon arrival to the emergency department.  He is tachypneic.  Afebrile.  He states that his knee has been healing well, some residual pain but nothing new.  He has had some urinary frequency.  He denies any abdominal symptoms like pain nausea vomiting diarrhea.   External Medical Documents Reviewed: Discharge summary dated 06/04/2023 for admission for chief complaint of chills, vomiting, with a increased lactic, ultimately requiring repeat irrigation and debridement of the right knee performed on 06/01/2023 by orthopedic surgery.  He was discharged with a 2-week course of Levaquin and Augmentin at discharge.      Physical Exam   Triage Vital Signs: ED Triage Vitals [06/18/23 2111]  Encounter Vitals Group     BP      Systolic BP Percentile      Diastolic BP Percentile      Pulse      Resp      Temp      Temp src      SpO2      Weight 190 lb (86.2 kg)     Height 6' (1.829 m)     Head Circumference      Peak Flow      Pain Score 4     Pain Loc      Pain Education      Exclude from Growth Chart     Most recent vital signs: Vitals:   06/18/23 2112 06/18/23 2200  BP: 121/83 131/85  Pulse: 94 97  Resp:  17 (!) 26  Temp: 97.8 F (36.6 C)   SpO2: 100% 93%    General: Awake, no distress. CV:  Good peripheral perfusion.  Resp:  Normal effort.  Abd:  No distention.  Other:  Lungs clear without focality or wheezing.  Abdomen soft nontender.  The knee appears warm, slight swelling, but he is able to range at 90 degrees flexion and near full extension.   ED Results / Procedures / Treatments   Labs (all labs ordered are listed, but only abnormal results are displayed) Labs Reviewed  LACTIC ACID, PLASMA - Abnormal; Notable for the following components:      Result Value   Lactic Acid, Venous 3.2 (*)    All other components within normal limits  COMPREHENSIVE METABOLIC PANEL - Abnormal; Notable for the following components:   Sodium 133 (*)    Glucose, Bld 144 (*)    All other components within normal limits  URINALYSIS, W/ REFLEX TO CULTURE (INFECTION SUSPECTED) - Abnormal; Notable for the following components:   Color, Urine YELLOW (*)    APPearance CLEAR (*)    All other components within normal limits  TROPONIN I (HIGH SENSITIVITY) - Abnormal; Notable for the following components:   Troponin I (  High Sensitivity) 35 (*)    All other components within normal limits  RESP PANEL BY RT-PCR (RSV, FLU A&B, COVID)  RVPGX2  CULTURE, BLOOD (ROUTINE X 2)  CULTURE, BLOOD (ROUTINE X 2)  CBC WITH DIFFERENTIAL/PLATELET  PROTIME-INR  APTT  LACTIC ACID, PLASMA     I ordered and reviewed the above labs they are notable for lactic elevated.  EKG  ED ECG REPORT I, Pilar Jarvis, the attending physician, personally viewed and interpreted this ECG.   Date: 06/18/2023  EKG Time: 2110  Rate: 95  Rhythm: sinus  Axis: nl  Intervals:none  ST&T Change: no stemi    RADIOLOGY I independently reviewed and interpreted chest x-ray and see no obvious focality or pneumothorax I also reviewed radiologist's formal read.   PROCEDURES:  Critical Care performed: Yes, see critical care procedure  note(s)  .Critical Care  Performed by: Pilar Jarvis, MD Authorized by: Pilar Jarvis, MD   Critical care provider statement:    Critical care time (minutes):  30   Critical care was time spent personally by me on the following activities:  Development of treatment plan with patient or surrogate, discussions with consultants, evaluation of patient's response to treatment, examination of patient, ordering and review of laboratory studies, ordering and review of radiographic studies, ordering and performing treatments and interventions, pulse oximetry, re-evaluation of patient's condition and review of old charts    MEDICATIONS ORDERED IN ED: Medications  vancomycin (VANCOCIN) IVPB 1000 mg/200 mL premix (1,000 mg Intravenous New Bag/Given 06/18/23 2140)    Followed by  vancomycin (VANCOREADY) IVPB 750 mg/150 mL (750 mg Intravenous New Bag/Given 06/18/23 2141)  lactated ringers bolus 1,000 mL (0 mLs Intravenous Stopped 06/18/23 2202)  ceFEPIme (MAXIPIME) 2 g in sodium chloride 0.9 % 100 mL IVPB (0 g Intravenous Stopped 06/18/23 2202)    External physician / consultants:  I spoke with Dr. Signa Kell of orthopedics regarding care plan for this patient.   IMPRESSION / MDM / ASSESSMENT AND PLAN / ED COURSE  I reviewed the triage vital signs and the nursing notes.                                Patient's presentation is most consistent with acute presentation with potential threat to life or bodily function.  Differential diagnosis includes, but is not limited to, sepsis, viral URI, bacterial pneumonia, urinary tract infection, joint infection   The patient is on the cardiac monitor to evaluate for evidence of arrhythmia and/or significant heart rate changes.  MDM:    Patient with concerns for sepsis given hypotension seen by EMS that improved with fluids, rigors/chills and lactic acidosis.  Sources include potentially respiratory infection given his URI symptoms, urinary tract infection  given his urinary frequency and of course his right knee which is swollen warm but has good range of motion that has recent infection.  Thus far workup unremarkable for source, chest x-ray appears clean on my read, urinalysis does not appear infected, and viral panel is pending.  He is covered with broad-spectrum antibiotics and sepsis crystalloids 30 cc/kg ideal body weight.  His vital signs remained stable normotensive.  I consulted with Dr. Signa Kell of orthopedics regarding knee as a possible source, though less likely given good range of motion he has been treated for this in the recent past, with viral URI symptoms predominating his chief complaint.   Admit       FINAL  CLINICAL IMPRESSION(S) / ED DIAGNOSES   Final diagnoses:  Severe sepsis (HCC)  Upper respiratory tract infection, unspecified type  Acute pain of right knee  Urinary frequency     Rx / DC Orders   ED Discharge Orders     None        Note:  This document was prepared using Dragon voice recognition software and may include unintentional dictation errors.    Pilar Jarvis, MD 06/18/23 2221

## 2023-06-19 ENCOUNTER — Inpatient Hospital Stay: Payer: No Typology Code available for payment source

## 2023-06-19 DIAGNOSIS — I739 Peripheral vascular disease, unspecified: Secondary | ICD-10-CM

## 2023-06-19 DIAGNOSIS — R0602 Shortness of breath: Secondary | ICD-10-CM | POA: Diagnosis present

## 2023-06-19 DIAGNOSIS — J9601 Acute respiratory failure with hypoxia: Secondary | ICD-10-CM | POA: Diagnosis not present

## 2023-06-19 DIAGNOSIS — Z96651 Presence of right artificial knee joint: Secondary | ICD-10-CM | POA: Diagnosis not present

## 2023-06-19 DIAGNOSIS — J439 Emphysema, unspecified: Secondary | ICD-10-CM

## 2023-06-19 DIAGNOSIS — E872 Acidosis, unspecified: Secondary | ICD-10-CM | POA: Diagnosis not present

## 2023-06-19 DIAGNOSIS — E861 Hypovolemia: Secondary | ICD-10-CM | POA: Diagnosis present

## 2023-06-19 DIAGNOSIS — R0989 Other specified symptoms and signs involving the circulatory and respiratory systems: Secondary | ICD-10-CM | POA: Diagnosis present

## 2023-06-19 LAB — CBC
HCT: 37.9 % — ABNORMAL LOW (ref 39.0–52.0)
Hemoglobin: 12.9 g/dL — ABNORMAL LOW (ref 13.0–17.0)
MCH: 31.9 pg (ref 26.0–34.0)
MCHC: 34 g/dL (ref 30.0–36.0)
MCV: 93.6 fL (ref 80.0–100.0)
Platelets: 245 10*3/uL (ref 150–400)
RBC: 4.05 MIL/uL — ABNORMAL LOW (ref 4.22–5.81)
RDW: 14.9 % (ref 11.5–15.5)
WBC: 5 10*3/uL (ref 4.0–10.5)
nRBC: 0 % (ref 0.0–0.2)

## 2023-06-19 LAB — COMPREHENSIVE METABOLIC PANEL
ALT: 12 U/L (ref 0–44)
AST: 15 U/L (ref 15–41)
Albumin: 3.1 g/dL — ABNORMAL LOW (ref 3.5–5.0)
Alkaline Phosphatase: 85 U/L (ref 38–126)
Anion gap: 9 (ref 5–15)
BUN: 7 mg/dL — ABNORMAL LOW (ref 8–23)
CO2: 24 mmol/L (ref 22–32)
Calcium: 8.5 mg/dL — ABNORMAL LOW (ref 8.9–10.3)
Chloride: 102 mmol/L (ref 98–111)
Creatinine, Ser: 0.62 mg/dL (ref 0.61–1.24)
GFR, Estimated: >60 mL/min (ref >60)
Glucose, Bld: 115 mg/dL — ABNORMAL HIGH (ref 70–99)
Potassium: 3.5 mmol/L (ref 3.5–5.1)
Sodium: 135 mmol/L (ref 135–145)
Total Bilirubin: 0.8 mg/dL (ref 0.3–1.2)
Total Protein: 5.9 g/dL — ABNORMAL LOW (ref 6.5–8.1)

## 2023-06-19 LAB — PROCALCITONIN: Procalcitonin: <0.1 ng/mL

## 2023-06-19 LAB — CK: Total CK: 55 U/L (ref 49–397)

## 2023-06-19 LAB — D-DIMER, QUANTITATIVE: D-Dimer, Quant: 1.38 ug{FEU}/mL — ABNORMAL HIGH (ref 0.00–0.50)

## 2023-06-19 MED ORDER — SODIUM CHLORIDE 0.9% FLUSH
3.0000 mL | Freq: Two times a day (BID) | INTRAVENOUS | Status: DC
  Administered 2023-06-19 (×2): 3 mL via INTRAVENOUS

## 2023-06-19 MED ORDER — ALBUTEROL SULFATE (2.5 MG/3ML) 0.083% IN NEBU: 2.5000 mg | INHALATION_SOLUTION | Freq: Four times a day (QID) | RESPIRATORY_TRACT | Status: DC | PRN

## 2023-06-19 MED ORDER — ALBUTEROL SULFATE HFA 108 (90 BASE) MCG/ACT IN AERS: 1.0000 | INHALATION_SPRAY | Freq: Four times a day (QID) | RESPIRATORY_TRACT | Status: DC | PRN

## 2023-06-19 MED ORDER — DULOXETINE HCL 30 MG PO CPEP
30.0000 mg | ORAL_CAPSULE | Freq: Every day | ORAL | Status: DC
  Administered 2023-06-19: 30 mg via ORAL
  Filled 2023-06-19: qty 1

## 2023-06-19 MED ORDER — ROSUVASTATIN CALCIUM 10 MG PO TABS: 10.0000 mg | ORAL_TABLET | Freq: Every day | ORAL | Status: DC

## 2023-06-19 MED ORDER — ACETAMINOPHEN 650 MG RE SUPP: 650.0000 mg | Freq: Four times a day (QID) | RECTAL | Status: DC | PRN

## 2023-06-19 MED ORDER — PREGABALIN 75 MG PO CAPS
150.0000 mg | ORAL_CAPSULE | Freq: Three times a day (TID) | ORAL | Status: DC
  Administered 2023-06-19 (×2): 150 mg via ORAL
  Filled 2023-06-19 (×2): qty 2

## 2023-06-19 MED ORDER — FLUTICASONE PROPIONATE 50 MCG/ACT NA SUSP: 2.0000 | Freq: Two times a day (BID) | NASAL | Status: DC | PRN

## 2023-06-19 MED ORDER — ACETAMINOPHEN 325 MG PO TABS
650.0000 mg | ORAL_TABLET | Freq: Four times a day (QID) | ORAL | Status: DC | PRN
  Administered 2023-06-19: 650 mg via ORAL
  Filled 2023-06-19: qty 2

## 2023-06-19 MED ORDER — OXYCODONE HCL 5 MG PO TABS
5.0000 mg | ORAL_TABLET | ORAL | Status: DC | PRN
  Filled 2023-06-19: qty 1
  Filled 2023-06-19: qty 2

## 2023-06-19 MED ORDER — NICOTINE 21 MG/24HR TD PT24
21.0000 mg | MEDICATED_PATCH | Freq: Every day | TRANSDERMAL | Status: DC
  Administered 2023-06-19: 21 mg via TRANSDERMAL
  Filled 2023-06-19: qty 1

## 2023-06-19 MED ORDER — APIXABAN 2.5 MG PO TABS
2.5000 mg | ORAL_TABLET | Freq: Two times a day (BID) | ORAL | Status: DC
  Administered 2023-06-19: 2.5 mg via ORAL
  Filled 2023-06-19: qty 1

## 2023-06-19 MED ORDER — NORTRIPTYLINE HCL 10 MG PO CAPS
20.0000 mg | ORAL_CAPSULE | Freq: Every day | ORAL | Status: DC
  Filled 2023-06-19: qty 2

## 2023-06-19 MED ORDER — LACTATED RINGERS IV SOLN: Freq: Once | INTRAVENOUS | Status: AC

## 2023-06-19 MED ORDER — TELMISARTAN 80 MG PO TABS: 40.0000 mg | ORAL_TABLET | Freq: Every day | ORAL | Status: AC

## 2023-06-19 NOTE — Consult Note (Addendum)
ORTHOPAEDIC CONSULTATION  REQUESTING PHYSICIAN: Marcelino Duster, MD  Chief Complaint:   Fever/URI in setting of recent right TKA revision for possible infection  History of Present Illness: Anthony Bender is a 67 y.o. male who underwent right TKA by Dr. Joice Lofts on 05/19/2023.  He then underwent right knee irrigation and debridement and poly exchange by Dr. Joice Lofts on 06/01/2023.  1 week prior he developed chills and sweats, he presented to the emergency room 4 days prior to the irrigation and was admitted for IV antibiotics after being diagnosed with sepsis.  A knee aspiration demonstrated no growth culture nor were there any organisms noted on Gram stain.  The patient underwent the irrigation and debridement as a precaution.  The culture from the surgery is still currently being held for potential growth, still over 2 weeks later there is no growth on culture.  Surgical Gram stain did reveal gram-negative rods however this was likely an error.  The patient was discharged home with 2-week supply of Levaquin and Augmentin.  The patient states that he has finished the Augmentin but still had 1 day left of Levaquin.    The patient reported last night he developed increased fevers and chills in addition to shortness of breath and presented to the emergency room. The patient is currently being worked up for acute respiratory failure. The patient denies any increased pain in the right knee at today's visit. He has not noticed any purulent drainage nor any redness around the right knee.    Past Medical History:  Diagnosis Date   Anxiety    Aortic ectasia (HCC)    Asthma    Barrett's esophagus    BPH (benign prostatic hyperplasia)    COPD (chronic obstructive pulmonary disease) (HCC)    DDD (degenerative disc disease), lumbar    GERD (gastroesophageal reflux disease)    Headache    Hemorrhoids    Hyperlipidemia    Hyperlipidemia     Hypertension    Insomnia    Mild episode of recurrent major depressive disorder (HCC)    Neuropathy    Neuropathy, peripheral, idiopathic    Osteoarthritis    Primary osteoarthritis of right knee    Smoker    Tobacco use    Umbilical hernia    Vitamin D deficiency    Past Surgical History:  Procedure Laterality Date   AMPUTATION TOE Left 09/06/2022   Procedure: AMPUTATION TOE;  Surgeon: Gwyneth Revels, DPM;  Location: [facility] ORS;  Service: Podiatry;  Laterality: Left;   APPENDECTOMY     BACK SURGERY     injection   ESOPHAGOGASTRODUODENOSCOPY N/A 03/15/2015   Procedure: ESOPHAGOGASTRODUODENOSCOPY (EGD);  Surgeon: Wallace Cullens, MD;  Location: [facility] ENDOSCOPY;  Service: Gastroenterology;  Laterality: N/A;   ESOPHAGOGASTRODUODENOSCOPY (EGD) WITH PROPOFOL N/A 08/23/2015   Procedure: ESOPHAGOGASTRODUODENOSCOPY (EGD) WITH PROPOFOL;  Surgeon: Wallace Cullens, MD;  Location: [facility] ENDOSCOPY;  Service: Gastroenterology;  Laterality: N/A;   Barrx Procedure   ESOPHAGOGASTRODUODENOSCOPY (EGD) WITH PROPOFOL N/A 01/24/2016   Procedure: ESOPHAGOGASTRODUODENOSCOPY (EGD) WITH PROPOFOL;  Surgeon: Wallace Cullens, MD;  Location: [facility] ENDOSCOPY;  Service: Gastroenterology;  Laterality: N/A;   ESOPHAGOGASTRODUODENOSCOPY (EGD) WITH PROPOFOL N/A 12/03/2022   Procedure: ESOPHAGOGASTRODUODENOSCOPY (EGD) WITH PROPOFOL;  Surgeon: Wyline Mood, MD;  Location: [facility] ENDOSCOPY;  Service: Gastroenterology;  Laterality: N/A;   HERNIA REPAIR     JOINT REPLACEMENT Left    Partial Knee Replacement   KNEE ARTHROSCOPY     PAROTIDECTOMY Left 06/30/2017   Procedure: PAROTIDECTOMY;  Surgeon: Jenne Campus,  Sibyl Parr, MD;  Location: [facility] ORS;  Service: ENT;  Laterality: Left;   reattachment of finger Left    little finger   salvia gland tumors removed     SUPERFICIAL PERONEAL NERVE RELEASE     TOTAL KNEE ARTHROPLASTY Right 05/19/2023   Procedure: TOTAL KNEE ARTHROPLASTY;  Surgeon: Christena Flake, MD;  Location: [facility] ORS;  Service: Orthopedics;  Laterality:  Right;   TOTAL KNEE REVISION Right 06/01/2023   Procedure: TOTAL KNEE REVISION (POLY EXCHANGE) WASH OUT;  Surgeon: Christena Flake, MD;  Location: [facility] ORS;  Service: Orthopedics;  Laterality: Right;   Social History   Socioeconomic History   Marital status: Married    Spouse name: kathie   Number of children: 3   Years of education: Not on file   Highest education level: Some college, no degree  Occupational History    Comment: full time  Tobacco Use   Smoking status: Every Day    Current packs/day: 1.50    Average packs/day: 1.5 packs/day for 59.0 years (88.5 ttl pk-yrs)    Types: Cigarettes   Smokeless tobacco: Never  Vaping Use   Vaping status: Never Used  Substance and Sexual Activity   Alcohol use: Yes   Drug use: Yes    Types: Marijuana    Comment: CBD   Sexual activity: Not Currently  Other Topics Concern   Not on file  Social History Narrative   Married and lives at home with wife.    Social Determinants of Health   Financial Resource Strain: Medium Risk (11/28/2022)   Received from [facility]   Overall Financial Resource Strain (CARDIA)    Difficulty of Paying Living Expenses: Somewhat hard  Food Insecurity: No Food Insecurity (06/19/2023)   Hunger Vital Sign    Worried About Running Out of Food in the Last Year: Never true    Ran Out of Food in the Last Year: Never true  Transportation Needs: No Transportation Needs (06/19/2023)   PRAPARE - Administrator, Civil Service (Medical): No    Lack of Transportation (Non-Medical): No  Physical Activity: Inactive (01/12/2018)   Exercise Vital Sign    Days of Exercise per Week: 0 days    Minutes of Exercise per Session: 0 min  Stress: Stress Concern Present (01/12/2018)   [employer] of Occupational Health - Occupational Stress Questionnaire    Feeling of Stress : Rather much  Social Connections: Moderately Isolated (01/12/2018)   Social Connection and  Isolation Panel [NHANES]    Frequency of Communication with Friends and Family: Twice a week    Frequency of Social Gatherings with Friends and Family: Never    Attends Religious Services: Never    Diplomatic Services operational officer: No    Attends Engineer, structural: Never    Marital Status: Married   Family History  Problem Relation Age of Onset   Diabetes Mother    Hypertension Mother    Hypertension Father    Allergies  Allergen Reactions   Yellow Jacket Venom [Bee Venom] Anaphylaxis and Shortness Of Breath   Codeine Itching   Metaxalone Swelling   Venlafaxine Other (See Comments)    Loss of appetite, jittery   Zyban [Bupropion] Other (See Comments)    hallucinations   Fluoxetine Anxiety    anger   Prior to Admission medications   Medication Sig Start Date End Date Taking? Authorizing Provider  albuterol (PROVENTIL HFA;VENTOLIN HFA) 108 (90  Base) MCG/ACT inhaler Inhale into the lungs every 6 (six) hours as needed for wheezing or shortness of breath.    [provider]  Albuterol Sulfate 2.5 MG/0.5ML NEBU Inhale 1 each into the lungs every 6 (six) hours as needed (SOB).    [provider]  apixaban (ELIQUIS) 2.5 MG TABS tablet Take 1 tablet (2.5 mg total) by mouth 2 (two) times daily. 06/04/23   Anson Oregon, PA-C  DULoxetine (CYMBALTA) 60 MG capsule Take 60 mg by mouth daily. 11/07/22 11/02/23  [provider]  fluticasone (FLONASE) 50 MCG/ACT nasal spray Place 2 sprays into both nostrils 2 (two) times daily.  01/02/15   [provider]  levofloxacin (LEVAQUIN) 500 MG tablet Take 1 tablet (500 mg total) by mouth daily. 06/04/23   Anson Oregon, PA-C  LYRICA 150 MG capsule Take 150 mg by mouth 3 (three) times daily.  01/02/15   [provider]  nortriptyline (PAMELOR) 10 MG capsule Take 20 mg by mouth at bedtime. 04/23/22   [provider]  Omega-3-Acid Eth Est, Dietary, 1 g CAPS Take 1 g by mouth 2 (two)  times daily. Patient not taking: Reported on 05/28/2023    [provider]  omeprazole (PRILOSEC OTC) 20 MG tablet Take 2 tablets (40 mg total) by mouth in the morning and at bedtime. 12/04/22 12/04/23  Wouk, Wilfred Curtis, MD  oxyCODONE (ROXICODONE) 5 MG immediate release tablet Take 1-2 tablets (5-10 mg total) by mouth every 4 (four) hours as needed for moderate pain or severe pain. 06/04/23   Anson Oregon, PA-C  potassium chloride SA (KLOR-CON M) 20 MEQ tablet Take 20 mEq by mouth 2 (two) times daily. Patient not taking: Reported on 05/28/2023    [provider]  rOPINIRole (REQUIP) 0.5 MG tablet Take 0.5 mg by mouth 4 (four) times daily as needed.    [provider]  rosuvastatin (CRESTOR) 10 MG tablet Take 10 mg by mouth at bedtime.    [provider]  telmisartan (MICARDIS) 80 MG tablet Take 40 mg by mouth daily. 06/03/21   [provider]  TRELEGY ELLIPTA 100-62.5-25 MCG/ACT AEPB Inhale 1 puff into the lungs daily.    [provider]  zolpidem (AMBIEN) 10 MG tablet Take 0.5 tablets (5 mg total) by mouth at bedtime. PATIENT HAS SUPPLIES 01/12/18   Jomarie Longs, MD   Recent Labs    06/18/23 2113 06/19/23 0445  WBC 7.1 5.0  HGB 14.0 12.9*  HCT 40.8 37.9*  PLT 274 245  K 3.5 3.5  CL 99 102  CO2 22 24  BUN 8 7*  CREATININE 0.87 0.62  GLUCOSE 144* 115*  CALCIUM 8.9 8.5*  INR 1.1  --    MR KNEE RIGHT WO CONTRAST  Result Date: 06/19/2023 CLINICAL DATA:  Septic arthritis suspected. Total knee arthroplasty 05/19/2023 with subsequent swelling and chills. Patient underwent knee aspiration with open irrigation and debridement 06/01/2023. EXAM: MRI OF THE RIGHT KNEE WITHOUT CONTRAST TECHNIQUE: Multiplanar, multisequence MR imaging of the knee was performed. No intravenous contrast was administered. COMPARISON:  Radiographs 06/18/2023.  Preoperative MRI 05/05/2023. FINDINGS: Despite efforts by the technologist and patient, mild motion  artifact is present on today's exam and could not be eliminated. This reduces exam sensitivity and specificity. Protocol was adjusted to minimize artifact from the interval total knee arthroplasty. Bones/Joint/Cartilage Expected postsurgical changes from recent total knee arthroplasty. No evidence of hardware loosening, acute fracture or dislocation. Nonspecific mild T2 marrow signal around the  prosthesis without abnormality on the T1 weighted images. No gross cortical destruction. Nonspecific moderate-sized knee joint effusion with mild synovial irregularity and capsular thickening. Ligaments The collateral ligaments appear intact. Muscles and Tendons The appear tear colour muscles appear unremarkable. The extensor mechanism is intact. Soft tissue Anterior skin staples are in place. Mild nonspecific soft tissue edema about the knee without focal fluid collection. No unexpected foreign bodies are identified. IMPRESSION: 1. Expected postsurgical changes from recent total knee arthroplasty. No evidence of hardware loosening, acute fracture or dislocation. No definite signs of osteomyelitis. 2. Nonspecific moderate-sized knee joint effusion with mild synovial irregularity and capsular thickening. If there is persistent concern for septic arthritis, repeat arthrocentesis should be considered. 3. Mild nonspecific soft tissue edema about the knee without focal fluid collection. Electronically Signed   By: Carey Bullocks M.D.   On: 06/19/2023 14:36   US ARTERIAL ABI (SCREENING LOWER EXTREMITY)  Result Date: 06/19/2023 CLINICAL DATA:  161096 Absent pedal pulses [id number] EXAM: NONINVASIVE PHYSIOLOGIC VASCULAR STUDY OF BILATERAL LOWER EXTREMITIES TECHNIQUE: Evaluation of both lower extremities were performed at rest, including calculation of ankle-brachial indices with single level Doppler, pressure and pulse volume recording. COMPARISON:  None Available. FINDINGS: Right ABI:  0.70 Left ABI:  1.01 Right Lower Extremity:   Biphasic arterial waveforms at the ankle. Left Lower Extremity:  Biphasic arterial waveforms at the ankle. IMPRESSION: 1. Patent lower extremity arteries at the level of the ankles bilaterally. 2. Moderately abnormal right lower extremity ABI of 0.70. 3. Normal left lower extremity ABI. Electronically Signed   By: Olive Bass M.D.   On: 06/19/2023 13:43   DG Chest Port 1 View  Result Date: 06/19/2023 CLINICAL DATA:  200808 Hypoxia 200808. Sepsis and shortness of breath. EXAM: PORTABLE CHEST 1 VIEW COMPARISON:  Chest radiograph 06/18/2023. FINDINGS: No consolidation. Mild central pulmonary venous congestion without overt pulmonary edema. Stable cardiac and mediastinal contours. No pleural effusion or pneumothorax. IMPRESSION: Mild central pulmonary venous congestion without overt pulmonary edema. Electronically Signed   By: Orvan Falconer M.D.   On: 06/19/2023 09:32   DG Knee Complete 4 Views Right  Result Date: 06/18/2023 CLINICAL DATA:  Knee pain EXAM: RIGHT KNEE - COMPLETE 4+ VIEW COMPARISON:  Right knee x-ray 05/19/2023 FINDINGS: Right knee arthroplasty appears in anatomic alignment. There is no evidence for hardware loosening or acute fracture. Anterior skin staples and soft tissue swelling are compatible with recent surgery. Soft tissue air has resolved in the interval. Joint effusion is present. IMPRESSION: 1. Right knee arthroplasty without complicating features. 2. Joint effusion. Electronically Signed   By: Darliss Cheney M.D.   On: 06/18/2023 23:25   DG Chest Port 1 View  Result Date: 06/18/2023 CLINICAL DATA:  Questionable sepsis EXAM: PORTABLE CHEST 1 VIEW COMPARISON:  Chest x-ray 05/28/2023 FINDINGS: The heart size and mediastinal contours are within normal limits. Both lungs are clear. The visualized skeletal structures are unremarkable. IMPRESSION: No active disease. Electronically Signed   By: Darliss Cheney M.D.   On: 06/18/2023 22:32     Positive ROS: All other systems have been  reviewed and were otherwise negative with the exception of those mentioned in the HPI and as above.  Physical Exam: BP 110/64 (BP Location: Left Arm)   Pulse 92   Temp 98.2 F (36.8 C)   Resp 18   Ht 6' (1.829 m)   Wt 79.3 kg   SpO2 99%   BMI 23.71 kg/m  General:  Alert, no acute distress Psychiatric:  Patient  is competent for consent with normal mood and affect     Orthopedic Exam:  RLE: 5/5 DF/PF/EHL SILT s/s/t/sp/dp distr Foot wwp RoM knee: 5-100 without significant discomfort Incision is clean, dry, and intact without any significant erythema or drainage. Knee joint effusion present.    Imaging:  As above: No fractures or dislocations noted.  Knee arthroplasty appears to be in an appropriate position without signs of complication or failure.  MRI of the knee does not show any definitive signs of infection or other complication.  Assessment/Plan: 67 year old male s/p right TKA on 05/19/2023 and s/p right knee I&D with poly exchange on 06/01/2023.  No definitive infection was identified postoperatively.  Patient has been on p.o. antibiotics at home and IV antibiotics here in the hospital.  Knee exam appears relatively benign and there is been no increase in knee pain per the patient.  It would be very unlikely for this to be septic arthritis of the right knee. 1.  Continue medical management and workup of patient's symptoms.  2.  When stable, patient may be discharged with orthopedic follow-up as an outpatient.  3.  Patient should continue physical therapy exercises at home.    Signa Kell   06/19/2023 8:36 AM

## 2023-06-19 NOTE — Assessment & Plan Note (Signed)
IV PPI. F/u with GI as outpatient as this is new earlier this year.

## 2023-06-19 NOTE — Plan of Care (Signed)

## 2023-06-19 NOTE — Assessment & Plan Note (Signed)
IV ppi.

## 2023-06-19 NOTE — Assessment & Plan Note (Signed)
Cont iv abx and orthopedic is consulted.  We will follow mri.

## 2023-06-19 NOTE — Progress Notes (Signed)
Reviewed discharge instructions with pt. Pt verbalized understanding. Pt discharged with all personal belongings. Staff wheeled pt out. Pt transported to home via family car.

## 2023-06-19 NOTE — Assessment & Plan Note (Signed)
Suspect 2/2 to hypovolemia and hypoperfusion or sepsis.  Source unclear we will get MRI of right knee.  Cont with [facility].Marland Kitchen

## 2023-06-19 NOTE — Assessment & Plan Note (Signed)
Pt has doppler pulses in right foot. Cont eliquis 2.5. pt needs CTA aorta with runoff studies I could not feel popliteal or femoral pulses. Suspect pt has severe PAD.

## 2023-06-19 NOTE — TOC Progression Note (Addendum)
Transition of Care [facility]) - Progression Note    Patient Details  Name: Anthony Bender MRN: [id number] Date of Birth: 12-02-1955  Transition of Care [facility]) CM/SW Contact  Marlowe Sax, RN Phone Number: 06/19/2023, 12:04 PM  Clinical Narrative:    The patient is on 6 liters oif O2, from home with his spouse, had Knee surgery on 8/13 and discharged home with [facility] for [facility] services on 08/14 and is still open with [facility] now Has DME at home including    Rolling Walker (2 wheels);Shower seat - built in;BSC/3in1   TOC to [employer] and will follow for needs and assist with DC planning    Barriers to Discharge: Continued Medical Work up  Expected Discharge Plan and Services   Discharge Planning Services: CM Consult   Living arrangements for the past 2 months: Single Family Home                                       Social Determinants of Health (SDOH) Interventions SDOH Screenings   Food Insecurity: No Food Insecurity (06/19/2023)  Housing: Patient Declined (06/19/2023)  Transportation Needs: No Transportation Needs (06/19/2023)  Utilities: Not At Risk (06/19/2023)  Financial Resource Strain: Medium Risk (11/28/2022)   Received from [facility], [facility]  Physical Activity: Inactive (01/12/2018)  Social Connections: Moderately Isolated (01/12/2018)  Stress: Stress Concern Present (01/12/2018)  Tobacco Use: High Risk (06/18/2023)    Readmission Risk Interventions     No data to display

## 2023-06-19 NOTE — Assessment & Plan Note (Addendum)
Stable. No wheezing. Cont prn albuterol and or duonebs as needed. Chest xray is pending.  Low threshold for CT chest pt is on low dose eliquis.

## 2023-06-19 NOTE — Assessment & Plan Note (Signed)
Vitals:   06/18/23 2112 06/18/23 2200 06/18/23 2300 06/19/23 0000  BP: 121/83 131/85 121/78 118/77   06/19/23 0030 06/19/23 0215  BP: 104/69 114/71  Stable. Cultures collected.  Hold  micardis  / cymbalta dose reduced to prevent hypotension.

## 2023-06-19 NOTE — Assessment & Plan Note (Signed)
Pt has symptoms of cramping and pain with walking.  ABI ordered.

## 2023-06-19 NOTE — Plan of Care (Signed)
  Problem: Respiratory: Goal: Ability to maintain adequate ventilation will improve Outcome: Progressing   Problem: Education: Goal: Knowledge of General Education information will improve Description: Including pain rating scale, medication(s)/side effects and non-pharmacologic comfort measures Outcome: Progressing   Problem: Fluid Volume: Goal: Hemodynamic stability will improve Outcome: Progressing   Problem: Clinical Measurements: Goal: Diagnostic test results will improve Outcome: Progressing   Problem: Nutrition: Goal: Adequate nutrition will be maintained Outcome: Progressing   Problem: Activity: Goal: Risk for activity intolerance will decrease Outcome: Progressing   Problem: Elimination: Goal: Will not experience complications related to urinary retention Outcome: Progressing   Problem: Pain Managment: Goal: General experience of comfort will improve Outcome: Progressing

## 2023-06-19 NOTE — Discharge Summary (Signed)
Physician Discharge Summary   Patient: Anthony Bender MRN: [id number] DOB: 1956-07-15  Admit date:     06/18/2023  Discharge date: 06/19/23  Discharge Physician: Marcelino Duster   PCP: Kennis Carina, MD   Recommendations at discharge:    PCP follow up in 1 week. Irbesartan held due to low BP.  Discharge Diagnoses: Principal Problem:   Acute respiratory failure with hypoxia (HCC) Active Problems:   Status post total knee replacement using cement, right   COPD (chronic obstructive pulmonary disease) (HCC)   Barrett's esophagus   Gastric ulcer   Claudication of right lower extremity (HCC)   Absent pedal pulses   Lactic acidosis   Hypovolemia   Shortness of breath  Resolved Problems:   * No resolved hospital problems. *  Hospital Course: [name] is a 67 y.o. male with medical history significant for depression/anxiety, anticoagulation on Eliquis, restless legs, hyperlipidemia hypertension, insomnia and right total knee arthroplasty in August 2024 complicated by joint infection status post washout a couple weeks ago, who presents to the emergency department with complaints of breathing difficulty, low BP. In ED he is started on sepsis protocol for shock with blood pressure of 80/60 O2 sats of 80% on 6 L nasal cannula. Prior 2 admission for knee infection s/p right TKA on 05/19/2023 and s/p right knee I&D with poly exchange on 06/01/2023. No definitive infection was identified postoperatively.  During the hospital stay, BP improved with IV fluids, lactate improved. Hypoxia better. MRI knee negative seen by ortho team advised no intervention. He has Levoflox 2 more days to finish. I advised smoking cessation, stopped Irbesartan therapy due to low BP. I advised PCP follow up for further management.   Assessment and Plan: * Acute respiratory failure with hypoxia (HCC) Unknown etiology for hypotension and hypoxia. He is smoker advised to quit smoking. No sepsis. MRI knee  looks unremarkable.  Orthopedic surgery evaluated him- Knee exam appears relatively benign   Status post total knee replacement using cement, right Discontinue antibiotics. Seen by Orthopedic surgery team. He finished Augmentin and levoflox regimen. Knee exam appears relatively benign   Gastric ulcer Barrett's esophagus F/u with GI as outpatient as this is new earlier this year.  Continue oral PPI  COPD (chronic obstructive pulmonary disease) (HCC) Stable. No wheezing. Cont prn albuterol and or duonebs as needed. Chest xray - mild pulmonary congestion with no edema. Smoking cessation advised.  Claudication of right lower extremity (HCC) Pt has doppler pulses in right foot.  Cont eliquis 2.5.  ABI 0.7, discussed with vascular advised outpatient follow up.  Hypovolemia Hypotension Improved with fluids. Advised to hold antihypertensive. Keep a log of BP with PCP follow up  Lactic acidosis Suspect 2/2 to hypovolemia and hypoperfusion. Sepsis ruled out.         Consultants: Orthopedic surgery Procedures performed: none  Disposition: Home Diet recommendation:  Discharge Diet Orders (From admission, onward)     Start     Ordered   06/19/23 0000  Diet - low sodium heart healthy        06/19/23 1621           Cardiac diet DISCHARGE MEDICATION: Allergies as of 06/19/2023       Reactions   Yellow Jacket Venom [bee Venom] Anaphylaxis, Shortness Of Breath   Codeine Itching   Metaxalone Swelling   Venlafaxine Other (See Comments)   Loss of appetite, jittery   Zyban [bupropion] Other (See Comments)   hallucinations   Fluoxetine Anxiety  anger        Medication List     STOP taking these medications    amoxicillin-clavulanate 875-125 MG tablet Commonly known as: AUGMENTIN   cyclobenzaprine 10 MG tablet Commonly known as: FLEXERIL   Omega-3-Acid Eth Est (Dietary) 1 g Caps   omeprazole 20 MG tablet Commonly known as: PriLOSEC OTC   oxyCODONE 5  MG immediate release tablet Commonly known as: Roxicodone   potassium chloride SA 20 MEQ tablet Commonly known as: KLOR-CON M       TAKE these medications    albuterol 108 (90 Base) MCG/ACT inhaler Commonly known as: VENTOLIN HFA Inhale into the lungs every 6 (six) hours as needed for wheezing or shortness of breath.   Albuterol Sulfate 2.5 MG/0.5ML Nebu Inhale 1 each into the lungs every 6 (six) hours as needed (SOB).   apixaban 2.5 MG Tabs tablet Commonly known as: Eliquis Take 1 tablet (2.5 mg total) by mouth 2 (two) times daily. What changed: when to take this   DULoxetine 60 MG capsule Commonly known as: CYMBALTA Take 60 mg by mouth daily.   fluticasone 50 MCG/ACT nasal spray Commonly known as: FLONASE Place 2 sprays into both nostrils 2 (two) times daily.   levofloxacin 500 MG tablet Commonly known as: Levaquin Take 1 tablet (500 mg total) by mouth daily.   Lyrica 150 MG capsule Generic drug: pregabalin Take 150 mg by mouth 3 (three) times daily.   nortriptyline 10 MG capsule Commonly known as: PAMELOR Take 20 mg by mouth at bedtime.   omeprazole 40 MG capsule Commonly known as: PRILOSEC Take 40 mg by mouth 2 (two) times daily.   rOPINIRole 0.5 MG tablet Commonly known as: REQUIP Take 0.5 mg by mouth 4 (four) times daily as needed.   rosuvastatin 10 MG tablet Commonly known as: CRESTOR Take 10 mg by mouth at bedtime.   telmisartan 80 MG tablet Commonly known as: MICARDIS Take 0.5 tablets (40 mg total) by mouth daily. Start taking on: June 26, 2023 What changed: These instructions start on June 26, 2023. If you are unsure what to do until then, ask your doctor or other care provider.   Trelegy Ellipta 100-62.5-25 MCG/ACT Aepb Generic drug: Fluticasone-Umeclidin-Vilant Inhale 1 puff into the lungs daily.   zolpidem 10 MG tablet Commonly known as: AMBIEN Take 0.5 tablets (5 mg total) by mouth at bedtime. PATIENT HAS SUPPLIES         Follow-up Information     Schnier, Latina Craver, MD Follow up in 3 week(s).   Specialties: Vascular Surgery, Cardiology, Radiology, Vascular Surgery Why: follow up after procedure bilateral lower extremity arterial duplex Contact information: [address] [place] [address] [phone]                Discharge Exam: Ceasar Mons Weights   06/18/23 2111 06/19/23 0500  Weight: 86.2 kg 79.3 kg   General - Elderly Caucasian male, no apparent distress HEENT - PERRLA, EOMI, atraumatic head, non tender sinuses. Lung - Clear, diffuse wheezes. Heart - S1, S2 heard, no murmurs, rubs, no pedal edema Neuro - Alert, awake and oriented x 3, non focal exam. Skin - Warm and dry.  Condition at discharge: stable  The results of significant diagnostics from this hospitalization (including imaging, microbiology, ancillary and laboratory) are listed below for reference.   Imaging Studies: MR KNEE RIGHT WO CONTRAST  Result Date: 06/19/2023 CLINICAL DATA:  Septic arthritis suspected. Total knee arthroplasty 05/19/2023 with subsequent swelling and chills.  Patient underwent knee aspiration with open irrigation and debridement 06/01/2023. EXAM: MRI OF THE RIGHT KNEE WITHOUT CONTRAST TECHNIQUE: Multiplanar, multisequence MR imaging of the knee was performed. No intravenous contrast was administered. COMPARISON:  Radiographs 06/18/2023.  Preoperative MRI 05/05/2023. FINDINGS: Despite efforts by the technologist and patient, mild motion artifact is present on today's exam and could not be eliminated. This reduces exam sensitivity and specificity. Protocol was adjusted to minimize artifact from the interval total knee arthroplasty. Bones/Joint/Cartilage Expected postsurgical changes from recent total knee arthroplasty. No evidence of hardware loosening, acute fracture or dislocation. Nonspecific mild T2 marrow signal around the prosthesis without abnormality on the T1 weighted images. No gross  cortical destruction. Nonspecific moderate-sized knee joint effusion with mild synovial irregularity and capsular thickening. Ligaments The collateral ligaments appear intact. Muscles and Tendons The appear tear colour muscles appear unremarkable. The extensor mechanism is intact. Soft tissue Anterior skin staples are in place. Mild nonspecific soft tissue edema about the knee without focal fluid collection. No unexpected foreign bodies are identified. IMPRESSION: 1. Expected postsurgical changes from recent total knee arthroplasty. No evidence of hardware loosening, acute fracture or dislocation. No definite signs of osteomyelitis. 2. Nonspecific moderate-sized knee joint effusion with mild synovial irregularity and capsular thickening. If there is persistent concern for septic arthritis, repeat arthrocentesis should be considered. 3. Mild nonspecific soft tissue edema about the knee without focal fluid collection. Electronically Signed   By: Carey Bullocks M.D.   On: 06/19/2023 14:36   US ARTERIAL ABI (SCREENING LOWER EXTREMITY)  Result Date: 06/19/2023 CLINICAL DATA:  [id number] Absent pedal pulses [id number] EXAM: NONINVASIVE PHYSIOLOGIC VASCULAR STUDY OF BILATERAL LOWER EXTREMITIES TECHNIQUE: Evaluation of both lower extremities were performed at rest, including calculation of ankle-brachial indices with single level Doppler, pressure and pulse volume recording. COMPARISON:  None Available. FINDINGS: Right ABI:  0.70 Left ABI:  1.01 Right Lower Extremity:  Biphasic arterial waveforms at the ankle. Left Lower Extremity:  Biphasic arterial waveforms at the ankle. IMPRESSION: 1. Patent lower extremity arteries at the level of the ankles bilaterally. 2. Moderately abnormal right lower extremity ABI of 0.70. 3. Normal left lower extremity ABI. Electronically Signed   By: Olive Bass M.D.   On: 06/19/2023 13:43   DG Chest Port 1 View  Result Date: 06/19/2023 CLINICAL DATA:  200808 Hypoxia 200808. Sepsis and  shortness of breath. EXAM: PORTABLE CHEST 1 VIEW COMPARISON:  Chest radiograph 06/18/2023. FINDINGS: No consolidation. Mild central pulmonary venous congestion without overt pulmonary edema. Stable cardiac and mediastinal contours. No pleural effusion or pneumothorax. IMPRESSION: Mild central pulmonary venous congestion without overt pulmonary edema. Electronically Signed   By: Orvan Falconer M.D.   On: 06/19/2023 09:32   DG Knee Complete 4 Views Right  Result Date: 06/18/2023 CLINICAL DATA:  Knee pain EXAM: RIGHT KNEE - COMPLETE 4+ VIEW COMPARISON:  Right knee x-ray 05/19/2023 FINDINGS: Right knee arthroplasty appears in anatomic alignment. There is no evidence for hardware loosening or acute fracture. Anterior skin staples and soft tissue swelling are compatible with recent surgery. Soft tissue air has resolved in the interval. Joint effusion is present. IMPRESSION: 1. Right knee arthroplasty without complicating features. 2. Joint effusion. Electronically Signed   By: Darliss Cheney M.D.   On: 06/18/2023 23:25   DG Chest Port 1 View  Result Date: 06/18/2023 CLINICAL DATA:  Questionable sepsis EXAM: PORTABLE CHEST 1 VIEW COMPARISON:  Chest x-ray 05/28/2023 FINDINGS: The heart size and mediastinal contours are within normal limits. Both lungs are clear. The  visualized skeletal structures are unremarkable. IMPRESSION: No active disease. Electronically Signed   By: Darliss Cheney M.D.   On: 06/18/2023 22:32   US Venous Img Lower Unilateral Right (DVT)  Result Date: 05/31/2023 CLINICAL DATA:  Right lower extremity pain and edema. Recent right total knee replacement. Evaluate for DVT. EXAM: RIGHT LOWER EXTREMITY VENOUS DOPPLER ULTRASOUND TECHNIQUE: Gray-scale sonography with graded compression, as well as color Doppler and duplex ultrasound were performed to evaluate the lower extremity deep venous systems from the level of the common femoral vein and including the common femoral, femoral, profunda femoral,  popliteal and calf veins including the posterior tibial, peroneal and gastrocnemius veins when visible. The superficial great saphenous vein was also interrogated. Spectral Doppler was utilized to evaluate flow at rest and with distal augmentation maneuvers in the common femoral, femoral and popliteal veins. COMPARISON:  None Available. FINDINGS: Contralateral Common Femoral Vein: Respiratory phasicity is normal and symmetric with the symptomatic side. No evidence of thrombus. Normal compressibility. Common Femoral Vein: No evidence of thrombus. Normal compressibility, respiratory phasicity and response to augmentation. Saphenofemoral Junction: No evidence of thrombus. Normal compressibility and flow on color Doppler imaging. Profunda Femoral Vein: No evidence of thrombus. Normal compressibility and flow on color Doppler imaging. Femoral Vein: No evidence of thrombus. Normal compressibility, respiratory phasicity and response to augmentation. Popliteal Vein: No evidence of thrombus. Normal compressibility, respiratory phasicity and response to augmentation. Calf Veins: No evidence of thrombus. Normal compressibility and flow on color Doppler imaging. Superficial Great Saphenous Vein: No evidence of thrombus. Normal compressibility. Other Findings:  None. IMPRESSION: No evidence of DVT within the right lower extremity. Electronically Signed   By: Simonne Come M.D.   On: 05/31/2023 16:58   DG Chest 2 View  Result Date: 05/28/2023 CLINICAL DATA:  Shortness of breath EXAM: CHEST - 2 VIEW COMPARISON:  X-ray 12/03/2022 FINDINGS: The heart size and mediastinal contours are within normal limits. Both lungs are clear. No consolidation, pneumothorax or effusion. No edema. The visualized skeletal structures are unremarkable. Degenerative changes are seen along the spine. IMPRESSION: No acute cardiopulmonary disease Electronically Signed   By: Karen Kays M.D.   On: 05/28/2023 14:57    Microbiology: Results for orders  placed or performed during the hospital encounter of 06/18/23  Blood Culture (routine x 2)     Status: None (Preliminary result)   Collection Time: 06/18/23  9:13 PM   Specimen: BLOOD  Result Value Ref Range Status   Specimen Description BLOOD BLOOD RIGHT ARM  Final   Special Requests   Final    BOTTLES DRAWN AEROBIC AND ANAEROBIC Blood Culture results may not be optimal due to an inadequate volume of blood received in culture bottles   Culture   Final    NO GROWTH < 12 HOURS Performed at [facility], [address] [place], [place] [address]    Report Status PENDING  Incomplete  Blood Culture (routine x 2)     Status: None (Preliminary result)   Collection Time: 06/18/23  9:14 PM   Specimen: BLOOD  Result Value Ref Range Status   Specimen Description BLOOD BLOOD LEFT ARM  Final   Special Requests   Final    BOTTLES DRAWN AEROBIC AND ANAEROBIC Blood Culture results may not be optimal due to an excessive volume of blood received in culture bottles   Culture   Final    NO GROWTH < 12 HOURS Performed at [facility], [address], [place], [place] [address]  Report Status PENDING  Incomplete  Resp panel by RT-PCR (RSV, Flu A&B, Covid) Anterior Nasal Swab     Status: None   Collection Time: 06/18/23  9:30 PM   Specimen: Anterior Nasal Swab  Result Value Ref Range Status   SARS Coronavirus 2 by RT PCR NEGATIVE NEGATIVE Final    Comment: (NOTE) SARS-CoV-2 target nucleic acids are NOT DETECTED.  The SARS-CoV-2 RNA is generally detectable in upper respiratory specimens during the acute phase of infection. The lowest concentration of SARS-CoV-2 viral copies this assay can detect is 138 copies/mL. A negative result does not preclude SARS-Cov-2 infection and should not be used as the sole basis for treatment or other patient management decisions. A negative result may occur with  improper specimen collection/handling, submission of specimen other than  nasopharyngeal swab, presence of viral mutation(s) within the areas targeted by this assay, and inadequate number of viral copies(<138 copies/mL). A negative result must be combined with clinical observations, patient history, and epidemiological information. The expected result is Negative.  Fact Sheet for Patients:  BloggerCourse.com  Fact Sheet for Healthcare Providers:  SeriousBroker.it  This test is no t yet approved or cleared by the [place] FDA and  has been authorized for detection and/or diagnosis of SARS-CoV-2 by FDA under an Emergency Use Authorization (EUA). This EUA will remain  in effect (meaning this test can be used) for the duration of the COVID-19 declaration under Section 564(b)(1) of the Act, 21 U.S.C.section 360bbb-3(b)(1), unless the authorization is terminated  or revoked sooner.       Influenza A by PCR NEGATIVE NEGATIVE Final   Influenza B by PCR NEGATIVE NEGATIVE Final    Comment: (NOTE) The Xpert Xpress SARS-CoV-2/FLU/RSV plus assay is intended as an aid in the diagnosis of influenza from Nasopharyngeal swab specimens and should not be used as a sole basis for treatment. Nasal washings and aspirates are unacceptable for Xpert Xpress SARS-CoV-2/FLU/RSV testing.  Fact Sheet for Patients: BloggerCourse.com  Fact Sheet for Healthcare Providers: SeriousBroker.it  This test is not yet approved or cleared by the [place] FDA and has been authorized for detection and/or diagnosis of SARS-CoV-2 by FDA under an Emergency Use Authorization (EUA). This EUA will remain in effect (meaning this test can be used) for the duration of the COVID-19 declaration under Section 564(b)(1) of the Act, 21 U.S.C. section 360bbb-3(b)(1), unless the authorization is terminated or revoked.     Resp Syncytial Virus by PCR NEGATIVE NEGATIVE Final    Comment:  (NOTE) Fact Sheet for Patients: BloggerCourse.com  Fact Sheet for Healthcare Providers: SeriousBroker.it  This test is not yet approved or cleared by the [place] FDA and has been authorized for detection and/or diagnosis of SARS-CoV-2 by FDA under an Emergency Use Authorization (EUA). This EUA will remain in effect (meaning this test can be used) for the duration of the COVID-19 declaration under Section 564(b)(1) of the Act, 21 U.S.C. section 360bbb-3(b)(1), unless the authorization is terminated or revoked.  Performed at [facility], [address] [place], [place] [address]     Labs: CBC: Recent Labs  Lab 06/18/23 2113 06/19/23 0445  WBC 7.1 5.0  NEUTROABS 4.8  --   HGB 14.0 12.9*  HCT 40.8 37.9*  MCV 93.2 93.6  PLT 274 245   Basic Metabolic Panel: Recent Labs  Lab 06/18/23 2113 06/19/23 0445  NA 133* 135  K 3.5 3.5  CL 99 102  CO2 22 24  GLUCOSE 144* 115*  BUN 8  7*  CREATININE 0.87 0.62  CALCIUM 8.9 8.5*   Liver Function Tests: Recent Labs  Lab 06/18/23 2113 06/19/23 0445  AST 23 15  ALT 14 12  ALKPHOS 103 85  BILITOT 1.1 0.8  PROT 6.7 5.9*  ALBUMIN 3.5 3.1*   CBG: No results for input(s): "GLUCAP" in the last 168 hours.  Discharge time spent: 35 minutes.  Signed: Marcelino Duster, MD [facility] 06/19/2023

## 2023-06-19 NOTE — Progress Notes (Signed)
Patient is 18 days status post a open irrigation and debridement with polyethylene exchange for potential septic arthritis status post a right total knee arthroplasty.  The patient was 13 days status post a right total knee arthroplasty at the time of the irrigation.  1 week prior he developed chills and sweats, he presented to the emergency room 4 days prior to the irrigation and was admitted for IV antibiotics after being diagnosed with sepsis.  A knee aspiration demonstrated no growth culture nor were there any organisms noted on Gram stain.  The patient underwent the irrigation and debridement as a precaution.  The culture from the surgery is still currently being held for potential growth, still over 2 weeks later there is no growth on culture.  Surgical Gram stain did reveal gram-negative rods however this was likely an error.  The patient was discharged home with 2-week supply of Levaquin and Augmentin.  The patient states that he has finished the Augmentin but still had 1 day left of Levaquin.  The patient reported last night he developed increased fevers and chills in addition to shortness of breath and presented to the emergency room.  The patient is currently being worked up for acute respiratory failure.  The patient denies any increased pain in the right knee at today's visit.  He has not noticed any purulent drainage nor any redness around the right knee.  The patient is able to fully extend the right knee is able to flex up to 95 degrees without any significant discomfort.  At most a mild effusion is identified to the right knee.  There is no erythema or open wound.  Given that cultures have been negative for over 2 weeks, and without any significant erythema or increased pain in the right knee I am doubtful of underlying infection involving the right total knee at this time.  The patient staples were removed this morning.  He may begin to shower and get incision wet at this time.  He may work with PT  as tolerated.  Orthopedics has been consulted, Dr. Signa Kell will place a formal consult note later this afternoon.   Anthony Batman, PA-C, CAQ-OS [facility]

## 2023-06-19 NOTE — Assessment & Plan Note (Signed)
Initial vitals shows hypotension and hypoxia, pt was 80% and BP of 80/60. Initial presentation is not clear as we do not have chest xray . Pt was on 6 L initially and o2 need resolved when I was at bedside pt was on RA. Pt was given LR 1L. No intake/output data recorded. No intake/output data recorded. Pt was treated as sepsis and septic shock with BS iv abx.  Chest xray ordered and pending.

## 2023-06-23 LAB — CULTURE, BLOOD (ROUTINE X 2)
Culture: NO GROWTH
Culture: NO GROWTH

## 2023-07-06 ENCOUNTER — Other Ambulatory Visit (INDEPENDENT_AMBULATORY_CARE_PROVIDER_SITE_OTHER): Payer: Self-pay | Admitting: Nurse Practitioner

## 2023-07-06 DIAGNOSIS — I739 Peripheral vascular disease, unspecified: Secondary | ICD-10-CM

## 2023-07-15 ENCOUNTER — Telehealth (INDEPENDENT_AMBULATORY_CARE_PROVIDER_SITE_OTHER): Payer: Self-pay

## 2023-07-15 ENCOUNTER — Encounter (INDEPENDENT_AMBULATORY_CARE_PROVIDER_SITE_OTHER): Payer: Self-pay | Admitting: Nurse Practitioner

## 2023-07-15 ENCOUNTER — Encounter (INDEPENDENT_AMBULATORY_CARE_PROVIDER_SITE_OTHER): Payer: Self-pay

## 2023-07-15 NOTE — Telephone Encounter (Signed)
error 

## 2023-08-17 ENCOUNTER — Ambulatory Visit (INDEPENDENT_AMBULATORY_CARE_PROVIDER_SITE_OTHER): Payer: No Typology Code available for payment source | Admitting: Vascular Surgery

## 2023-08-17 ENCOUNTER — Encounter (INDEPENDENT_AMBULATORY_CARE_PROVIDER_SITE_OTHER): Payer: Self-pay | Admitting: Vascular Surgery

## 2023-08-17 ENCOUNTER — Ambulatory Visit (INDEPENDENT_AMBULATORY_CARE_PROVIDER_SITE_OTHER): Payer: No Typology Code available for payment source

## 2023-08-17 VITALS — BP 131/89 | HR 85 | Resp 18 | Ht 69.5 in | Wt 182.2 lb

## 2023-08-17 DIAGNOSIS — I10 Essential (primary) hypertension: Secondary | ICD-10-CM

## 2023-08-17 DIAGNOSIS — E782 Mixed hyperlipidemia: Secondary | ICD-10-CM

## 2023-08-17 DIAGNOSIS — M150 Primary generalized (osteo)arthritis: Secondary | ICD-10-CM

## 2023-08-17 DIAGNOSIS — I739 Peripheral vascular disease, unspecified: Secondary | ICD-10-CM | POA: Diagnosis not present

## 2023-08-17 DIAGNOSIS — I7025 Atherosclerosis of native arteries of other extremities with ulceration: Secondary | ICD-10-CM | POA: Diagnosis not present

## 2023-08-17 DIAGNOSIS — J439 Emphysema, unspecified: Secondary | ICD-10-CM | POA: Diagnosis not present

## 2023-08-17 DIAGNOSIS — M1990 Unspecified osteoarthritis, unspecified site: Secondary | ICD-10-CM | POA: Insufficient documentation

## 2023-08-17 NOTE — Progress Notes (Unsigned)
MRN : [id number]  Anthony Bender is a 67 y.o. (1955/10/17) male who presents with chief complaint of check circulation.  History of Present Illness:    The patient is seen for evaluation of painful lower extremities and diminished pulses. Patient notes the pain is always associated with activity and is very consistent day today. Typically, the pain occurs at less than one block, progress is as activity continues to the point that the patient must stop walking. Resting including standing still for several minutes allows the patient to walk a similar distance before being forced to stop again. Uneven terrain and inclines shorten the distance. The pain has been progressive over the past several years. The patient denies any abrupt changes in claudication symptoms.  The patient states the inability to walk is causing problems with daily activities.  The patient denies rest pain or dangling of an extremity off the side of the bed during the night for relief. No open wounds or sores at this time. No prior interventions or surgeries.  No history of back problems or DJD of the lumbar sacral spine.   The patient's blood pressure has been stable and relatively well controlled. The patient denies amaurosis fugax or recent TIA symptoms. There are no recent neurological changes noted. The patient denies history of DVT, PE or superficial thrombophlebitis. The patient denies recent episodes of angina or shortness of breath.   No outpatient medications have been marked as taking for the 08/17/23 encounter (Appointment) with Gilda Crease, Latina Craver, MD.    Past Medical History:  Diagnosis Date   Anxiety    Aortic ectasia (HCC)    Asthma    Barrett's esophagus    BPH (benign prostatic hyperplasia)    COPD (chronic obstructive pulmonary disease) (HCC)    DDD (degenerative disc disease), lumbar    GERD (gastroesophageal reflux disease)     Headache    Hemorrhoids    Hyperlipidemia    Hyperlipidemia    Hypertension    Insomnia    Mild episode of recurrent major depressive disorder (HCC)    Neuropathy    Neuropathy, peripheral, idiopathic    Osteoarthritis    Primary osteoarthritis of right knee    Smoker    Tobacco use    Umbilical hernia    Vitamin D deficiency     Past Surgical History:  Procedure Laterality Date   AMPUTATION TOE Left 09/06/2022   Procedure: AMPUTATION TOE;  Surgeon: Gwyneth Revels, DPM;  Location: [facility] ORS;  Service: Podiatry;  Laterality: Left;   APPENDECTOMY     BACK SURGERY     injection   ESOPHAGOGASTRODUODENOSCOPY N/A 03/15/2015   Procedure: ESOPHAGOGASTRODUODENOSCOPY (EGD);  Surgeon: Wallace Cullens, MD;  Location: [facility] ENDOSCOPY;  Service: Gastroenterology;  Laterality: N/A;   ESOPHAGOGASTRODUODENOSCOPY (EGD) WITH PROPOFOL N/A 08/23/2015   Procedure: ESOPHAGOGASTRODUODENOSCOPY (EGD) WITH PROPOFOL;  Surgeon: Wallace Cullens, MD;  Location: [facility] ENDOSCOPY;  Service: Gastroenterology;  Laterality: N/A;   Barrx Procedure   ESOPHAGOGASTRODUODENOSCOPY (EGD) WITH PROPOFOL N/A 01/24/2016   Procedure: ESOPHAGOGASTRODUODENOSCOPY (EGD) WITH PROPOFOL;  Surgeon: Wallace Cullens, MD;  Location: [facility]  ENDOSCOPY;  Service: Gastroenterology;  Laterality: N/A;   ESOPHAGOGASTRODUODENOSCOPY (EGD) WITH PROPOFOL N/A 12/03/2022   Procedure: ESOPHAGOGASTRODUODENOSCOPY (EGD) WITH PROPOFOL;  Surgeon: Wyline Mood, MD;  Location: [facility] ENDOSCOPY;  Service: Gastroenterology;  Laterality: N/A;   HERNIA REPAIR     JOINT REPLACEMENT Left    Partial Knee Replacement   KNEE ARTHROSCOPY     PAROTIDECTOMY Left 06/30/2017   Procedure: PAROTIDECTOMY;  Surgeon: Linus Salmons, MD;  Location: [facility] ORS;  Service: ENT;  Laterality: Left;   reattachment of finger Left    little finger   salvia gland tumors removed     SUPERFICIAL PERONEAL NERVE RELEASE     TOTAL KNEE ARTHROPLASTY Right 05/19/2023   Procedure: TOTAL KNEE ARTHROPLASTY;  Surgeon:  Christena Flake, MD;  Location: [facility] ORS;  Service: Orthopedics;  Laterality: Right;   TOTAL KNEE REVISION Right 06/01/2023   Procedure: TOTAL KNEE REVISION (POLY EXCHANGE) WASH OUT;  Surgeon: Christena Flake, MD;  Location: [facility] ORS;  Service: Orthopedics;  Laterality: Right;    Social History Social History   Tobacco Use   Smoking status: Every Day    Current packs/day: 1.50    Average packs/day: 1.5 packs/day for 59.0 years (88.5 ttl pk-yrs)    Types: Cigarettes   Smokeless tobacco: Never  Vaping Use   Vaping status: Never Used  Substance Use Topics   Alcohol use: Yes   Drug use: Yes    Types: Marijuana    Comment: CBD    Family History Family History  Problem Relation Age of Onset   Diabetes Mother    Hypertension Mother    Hypertension Father     Allergies  Allergen Reactions   Yellow Jacket Venom [Bee Venom] Anaphylaxis and Shortness Of Breath   Codeine Itching   Metaxalone Swelling   Venlafaxine Other (See Comments)    Loss of appetite, jittery   Zyban [Bupropion] Other (See Comments)    hallucinations   Fluoxetine Anxiety    anger     REVIEW OF SYSTEMS (Negative unless checked)  Constitutional: [] Weight loss  [] Fever  [] Chills Cardiac: [] Chest pain   [] Chest pressure   [] Palpitations   [] Shortness of breath when laying flat   [] Shortness of breath with exertion. Vascular:  [x] Pain in legs with walking   [] Pain in legs at rest  [] History of DVT   [] Phlebitis   [] Swelling in legs   [] Varicose veins   [] Non-healing ulcers Pulmonary:   [] Uses home oxygen   [] Productive cough   [] Hemoptysis   [] Wheeze  [] COPD   [] Asthma Neurologic:  [] Dizziness   [] Seizures   [] History of stroke   [] History of TIA  [] Aphasia   [] Vissual changes   [] Weakness or numbness in arm   [] Weakness or numbness in leg Musculoskeletal:   [] Joint swelling   [] Joint pain   [] Low back pain Hematologic:  [] Easy bruising  [] Easy bleeding   [] Hypercoagulable state   [] Anemic Gastrointestinal:   [] Diarrhea   [] Vomiting  [] Gastroesophageal reflux/heartburn   [] Difficulty swallowing. Genitourinary:  [] Chronic kidney disease   [] Difficult urination  [] Frequent urination   [] Blood in urine Skin:  [] Rashes   [] Ulcers  Psychological:  [] History of anxiety   []  History of major depression.  Physical Examination  There were no vitals filed for this visit. There is no height or weight on file to calculate BMI. Gen: WD/WN, NAD Head: [place]/AT, No temporalis wasting.  Ear/Nose/Throat: Hearing grossly intact, nares w/o erythema or drainage Eyes: PER, EOMI, sclera nonicteric.  Neck:  Supple, no masses.  No bruit or JVD.  Pulmonary:  Good air movement, no audible wheezing, no use of accessory muscles.  Cardiac: RRR, normal S1, S2, no Murmurs. Vascular:  mild trophic changes, no open wounds Vessel Right Left  Radial Palpable Palpable  PT Not Palpable Not Palpable  DP Not Palpable Not Palpable  Gastrointestinal: soft, non-distended. No guarding/no peritoneal signs.  Musculoskeletal: M/S 5/5 throughout.  No visible deformity.  Neurologic: CN 2-12 intact. Pain and light touch intact in extremities.  Symmetrical.  Speech is fluent. Motor exam as listed above. Psychiatric: Judgment intact, Mood & affect appropriate for pt's clinical situation. Dermatologic: No rashes or ulcers noted.  No changes consistent with cellulitis.   CBC Lab Results  Component Value Date   WBC 5.0 06/19/2023   HGB 12.9 (L) 06/19/2023   HCT 37.9 (L) 06/19/2023   MCV 93.6 06/19/2023   PLT 245 06/19/2023    BMET    Component Value Date/Time   NA 135 06/19/2023 0445   NA 138 04/27/2014 0841   K 3.5 06/19/2023 0445   K 3.7 04/27/2014 0841   CL 102 06/19/2023 0445   CL 107 04/27/2014 0841   CO2 24 06/19/2023 0445   CO2 25 04/27/2014 0841   GLUCOSE 115 (H) 06/19/2023 0445   GLUCOSE 118 (H) 04/27/2014 0841   BUN 7 (L) 06/19/2023 0445   BUN 11 04/27/2014 0841   CREATININE 0.62 06/19/2023 0445   CREATININE 0.93  04/27/2014 0841   CALCIUM 8.5 (L) 06/19/2023 0445   CALCIUM 8.5 04/27/2014 0841   GFRNONAA >60 06/19/2023 0445   GFRNONAA >60 04/27/2014 0841   GFRAA >60 06/09/2020 1509   GFRAA >60 04/27/2014 0841   CrCl cannot be calculated (Patient's most recent lab result is older than the maximum 21 days allowed.).  COAG Lab Results  Component Value Date   INR 1.1 06/18/2023   INR 1.0 05/28/2023   INR 1.0 12/03/2022    Radiology No results found.   Assessment/Plan There are no diagnoses linked to this encounter.   Levora Dredge, MD  08/17/2023 1:18 PM

## 2023-08-19 ENCOUNTER — Encounter (INDEPENDENT_AMBULATORY_CARE_PROVIDER_SITE_OTHER): Payer: Self-pay | Admitting: Vascular Surgery

## 2023-10-21 ENCOUNTER — Encounter: Payer: Self-pay | Admitting: Acute Care

## 2024-01-07 ENCOUNTER — Other Ambulatory Visit: Payer: Self-pay | Admitting: Specialist

## 2024-01-07 DIAGNOSIS — F17210 Nicotine dependence, cigarettes, uncomplicated: Secondary | ICD-10-CM

## 2024-01-19 ENCOUNTER — Ambulatory Visit
Admission: RE | Admit: 2024-01-19 | Discharge: 2024-01-19 | Disposition: A | Discharge status: Home / Self Care | Source: Ambulatory Visit | Attending: Specialist | Admitting: Specialist

## 2024-01-19 DIAGNOSIS — F17210 Nicotine dependence, cigarettes, uncomplicated: Secondary | ICD-10-CM | POA: Insufficient documentation

## 2024-02-16 NOTE — Progress Notes (Signed)
 MRN : [id number]  Anthony Bender is a 68 y.o. ([birth year]) male who presents with chief complaint of check circulation.  History of Present Illness:   The patient returns for evaluation of his peripheral arterial disease as well as follow-up for an abdominal aortic aneurysm.  The patient does not have significant claudication symptoms.  He has no open wounds or ulcerations.  He has not developed any rest pain.  Today he has an ABI of 1.05 on the right and 1.11 on the left.  He has strong triphasic waveforms and normal toe waveforms bilaterally.  He also had a family history of an abdominal aortic aneurysm.  As such he has undergone testing which showed a 2.7 cm distal abdominal aortic aneurysm.  He denies any symptoms consistent with distal embolization. Current Meds  Medication Sig   albuterol  (PROVENTIL  HFA;VENTOLIN  HFA) 108 (90 Base) MCG/ACT inhaler Inhale into the lungs every 6 (six) hours as needed for wheezing or shortness of breath.   Albuterol  Sulfate 2.5 MG/0.5ML NEBU Inhale 1 each into the lungs every 6 (six) hours as needed (SOB).   cyanocobalamin (VITAMIN B12) 1000 MCG tablet Take 1,000 mcg by mouth daily.   DULoxetine  (CYMBALTA ) 60 MG capsule Take 60 mg by mouth daily.   fexofenadine (ALLEGRA) 180 MG tablet Take 180 mg by mouth.   fluticasone  (FLONASE ) 50 MCG/ACT nasal spray Place 2 sprays into both nostrils 2 (two) times daily.    LYRICA  150 MG capsule Take 150 mg by mouth 3 (three) times daily.    nortriptyline  (PAMELOR ) 10 MG capsule Take 20 mg by mouth at bedtime.   omeprazole  (PRILOSEC ) 40 MG capsule Take 40 mg by mouth 2 (two) times daily.   rOPINIRole  (REQUIP ) 0.5 MG tablet Take 0.5 mg by mouth 4 (four) times daily as needed.   rosuvastatin  (CRESTOR ) 10 MG tablet Take 10 mg by mouth at bedtime.   telmisartan  (MICARDIS ) 80 MG tablet Take 0.5 tablets (40 mg total) by mouth daily.   TRELEGY ELLIPTA  100-62.5-25 MCG/ACT AEPB Inhale 1 puff into the lungs daily.   zolpidem  (AMBIEN ) 10 MG tablet Take 0.5 tablets (5 mg total) by mouth at bedtime. PATIENT HAS SUPPLIES    Past Medical History:  Diagnosis Date   Anxiety    Aortic ectasia (HCC)    Asthma    Barrett's esophagus    BPH (benign prostatic hyperplasia)    COPD (chronic obstructive pulmonary disease) (HCC)    DDD (degenerative disc disease), lumbar    GERD (gastroesophageal reflux disease)    Headache    Hemorrhoids    Hyperlipidemia    Hyperlipidemia    Hypertension    Insomnia    Mild episode of recurrent major depressive disorder (HCC)    Neuropathy    Neuropathy, peripheral, idiopathic    Osteoarthritis    Primary osteoarthritis of right knee    Smoker    Tobacco use    Umbilical hernia    Vitamin D deficiency     Past Surgical History:  Procedure Laterality Date   AMPUTATION TOE Left 09/06/2022   Procedure: AMPUTATION  TOE;  Surgeon: Anell Baptist, DPM;  Location: [facility] ORS;  Service: Podiatry;  Laterality: Left;   APPENDECTOMY     BACK SURGERY     injection   ESOPHAGOGASTRODUODENOSCOPY N/A 03/15/2015   Procedure: ESOPHAGOGASTRODUODENOSCOPY (EGD);  Surgeon: Stephens Eis, MD;  Location: [facility] ENDOSCOPY;  Service: Gastroenterology;  Laterality: N/A;   ESOPHAGOGASTRODUODENOSCOPY (EGD) WITH PROPOFOL  N/A 08/23/2015   Procedure: ESOPHAGOGASTRODUODENOSCOPY (EGD) WITH PROPOFOL ;  Surgeon: Stephens Eis, MD;  Location: [facility] ENDOSCOPY;  Service: Gastroenterology;  Laterality: N/A;   Barrx Procedure   ESOPHAGOGASTRODUODENOSCOPY (EGD) WITH PROPOFOL  N/A 01/24/2016   Procedure: ESOPHAGOGASTRODUODENOSCOPY (EGD) WITH PROPOFOL ;  Surgeon: Stephens Eis, MD;  Location: [facility] ENDOSCOPY;  Service: Gastroenterology;  Laterality: N/A;   ESOPHAGOGASTRODUODENOSCOPY (EGD) WITH PROPOFOL  N/A 12/03/2022   Procedure: ESOPHAGOGASTRODUODENOSCOPY (EGD) WITH PROPOFOL ;  Surgeon: Luke Salaam, MD;  Location: [facility] ENDOSCOPY;  Service: Gastroenterology;  Laterality:  N/A;   HERNIA REPAIR     JOINT REPLACEMENT Left    Partial Knee Replacement   KNEE ARTHROSCOPY     PAROTIDECTOMY Left 06/30/2017   Procedure: PAROTIDECTOMY;  Surgeon: Lesly Raspberry, MD;  Location: [facility] ORS;  Service: ENT;  Laterality: Left;   reattachment of finger Left    little finger   salvia gland tumors removed     SUPERFICIAL PERONEAL NERVE RELEASE     TOTAL KNEE ARTHROPLASTY Right 05/19/2023   Procedure: TOTAL KNEE ARTHROPLASTY;  Surgeon: Elner Hahn, MD;  Location: [facility] ORS;  Service: Orthopedics;  Laterality: Right;   TOTAL KNEE REVISION Right 06/01/2023   Procedure: TOTAL KNEE REVISION (POLY EXCHANGE) WASH OUT;  Surgeon: Elner Hahn, MD;  Location: [facility] ORS;  Service: Orthopedics;  Laterality: Right;    Social History Social History   Tobacco Use   Smoking status: Every Day    Current packs/day: 1.50    Average packs/day: 1.5 packs/day for 59.0 years (88.5 ttl pk-yrs)    Types: Cigarettes   Smokeless tobacco: Never  Vaping Use   Vaping status: Never Used  Substance Use Topics   Alcohol use: Yes   Drug use: Yes    Types: Marijuana    Comment: CBD    Family History Family History  Problem Relation Age of Onset   Diabetes Mother    Hypertension Mother    Hypertension Father     Allergies  Allergen Reactions   Yellow Jacket Venom [Bee Venom] Anaphylaxis and Shortness Of Breath   Codeine Itching   Metaxalone Swelling   Venlafaxine Other (See Comments)    Loss of appetite, jittery   Zyban [Bupropion] Other (See Comments)    hallucinations   Fluoxetine Anxiety    anger     REVIEW OF SYSTEMS (Negative unless checked)  Constitutional: [] Weight loss  [] Fever  [] Chills Cardiac: [] Chest pain   [] Chest pressure   [] Palpitations   [] Shortness of breath when laying flat   [] Shortness of breath with exertion. Vascular:  [x] Pain in legs with walking   [] Pain in legs at rest  [] History of DVT   [] Phlebitis   [] Swelling in legs   [] Varicose veins    [] Non-healing ulcers Pulmonary:   [] Uses home oxygen   [] Productive cough   [] Hemoptysis   [] Wheeze  [] COPD   [] Asthma Neurologic:  [] Dizziness   [] Seizures   [] History of stroke   [] History of TIA  [] Aphasia   [] Vissual changes   [] Weakness or numbness in arm   [] Weakness or numbness in leg Musculoskeletal:   [] Joint swelling   [] Joint pain   []   Low back pain Hematologic:  [] Easy bruising  [] Easy bleeding   [] Hypercoagulable state   [] Anemic Gastrointestinal:  [] Diarrhea   [] Vomiting  [] Gastroesophageal reflux/heartburn   [] Difficulty swallowing. Genitourinary:  [] Chronic kidney disease   [] Difficult urination  [] Frequent urination   [] Blood in urine Skin:  [] Rashes   [] Ulcers  Psychological:  [] History of anxiety   []  History of major depression.  Physical Examination  Vitals:   02/18/24 0847  BP: 116/82  Pulse: 80  Resp: 18  Weight: 187 lb 3.2 oz (84.9 kg)  Height: 5\' 10"  (1.778 m)   Body mass index is 26.86 kg/m. Gen: WD/WN, NAD Head: [place]/AT, No temporalis wasting.  Ear/Nose/Throat: Hearing grossly intact, nares w/o erythema or drainage Eyes: PER, EOMI, sclera nonicteric.  Neck: Supple, no masses.  No bruit or JVD.  Pulmonary:  Good air movement, no audible wheezing, no use of accessory muscles.  Cardiac: RRR, normal S1, S2, no Murmurs. Vascular:  mild trophic changes, no open wounds Vessel Right Left  Radial Palpable Palpable  PT Not Palpable Not Palpable  DP Not Palpable Not Palpable  Gastrointestinal: soft, non-distended. No guarding/no peritoneal signs.  Musculoskeletal: M/S 5/5 throughout.  No visible deformity.  Neurologic: CN 2-12 intact. Pain and light touch intact in extremities.  Symmetrical.  Speech is fluent. Motor exam as listed above. Psychiatric: Judgment intact, Mood & affect appropriate for pt's clinical situation. Dermatologic: No rashes or ulcers noted.  No changes consistent with cellulitis.   CBC Lab Results  Component Value Date   WBC 5.0 06/19/2023    HGB 12.9 (L) 06/19/2023   HCT 37.9 (L) 06/19/2023   MCV 93.6 06/19/2023   PLT 245 06/19/2023    BMET    Component Value Date/Time   NA 135 06/19/2023 0445   NA 138 04/27/2014 0841   K 3.5 06/19/2023 0445   K 3.7 04/27/2014 0841   CL 102 06/19/2023 0445   CL 107 04/27/2014 0841   CO2 24 06/19/2023 0445   CO2 25 04/27/2014 0841   GLUCOSE 115 (H) 06/19/2023 0445   GLUCOSE 118 (H) 04/27/2014 0841   BUN 7 (L) 06/19/2023 0445   BUN 11 04/27/2014 0841   CREATININE 0.62 06/19/2023 0445   CREATININE 0.93 04/27/2014 0841   CALCIUM  8.5 (L) 06/19/2023 0445   CALCIUM  8.5 04/27/2014 0841   GFRNONAA >60 06/19/2023 0445   GFRNONAA >60 04/27/2014 0841   GFRAA >60 06/09/2020 1509   GFRAA >60 04/27/2014 0841   CrCl cannot be calculated (Patient's most recent lab result is older than the maximum 21 days allowed.).  COAG Lab Results  Component Value Date   INR 1.1 06/18/2023   INR 1.0 05/28/2023   INR 1.0 12/03/2022    Radiology No results found.   Assessment/Plan 1. Primary hypertension (Primary) Continue antihypertensive medications as already ordered, these medications have been reviewed and there are no changes at this time.  2. Mixed hyperlipidemia Continue statin as ordered and reviewed, no changes at this time  3. Abdominal aortic ectasia (HCC) The patient states usually occlusion of his abdominal aorta.  Based on this this can be followed on an annual basis.  Because this is our first evaluation we will have him return in 6 months to ensure is not rapid growth.  Remains relatively send will likely move to annual follow-up.  4. Atherosclerosis of native arteries of the extremities with ulceration (HCC)  Recommend:  The patient has evidence of atherosclerosis of the lower extremities with no claudication.  The  patient does not voice lifestyle limiting changes at this point in time.  Noninvasive studies do not suggest clinically significant change.  No invasive  studies, angiography or surgery at this time The patient should continue walking and begin a more formal exercise program.  The patient should continue antiplatelet therapy and aggressive treatment of the lipid abnormalities  No changes in the patient's medications at this time  Continued surveillance is indicated as atherosclerosis is likely to progress with time.    The patient will continue follow up with noninvasive studies as ordered.     Fallon E Brown, NP  02/22/2024 2:08 AM

## 2024-02-17 ENCOUNTER — Other Ambulatory Visit (INDEPENDENT_AMBULATORY_CARE_PROVIDER_SITE_OTHER): Payer: Self-pay | Admitting: Vascular Surgery

## 2024-02-17 DIAGNOSIS — I7025 Atherosclerosis of native arteries of other extremities with ulceration: Secondary | ICD-10-CM

## 2024-02-18 ENCOUNTER — Encounter (INDEPENDENT_AMBULATORY_CARE_PROVIDER_SITE_OTHER): Payer: Self-pay | Admitting: Vascular Surgery

## 2024-02-18 ENCOUNTER — Ambulatory Visit (INDEPENDENT_AMBULATORY_CARE_PROVIDER_SITE_OTHER): Payer: BLUE CROSS/BLUE SHIELD | Admitting: Nurse Practitioner

## 2024-02-18 ENCOUNTER — Ambulatory Visit (INDEPENDENT_AMBULATORY_CARE_PROVIDER_SITE_OTHER): Payer: BLUE CROSS/BLUE SHIELD

## 2024-02-18 ENCOUNTER — Ambulatory Visit (INDEPENDENT_AMBULATORY_CARE_PROVIDER_SITE_OTHER): Payer: Medicare Other

## 2024-02-18 VITALS — BP 116/82 | HR 80 | Resp 18 | Ht 70.0 in | Wt 187.2 lb

## 2024-02-18 DIAGNOSIS — E782 Mixed hyperlipidemia: Secondary | ICD-10-CM | POA: Diagnosis not present

## 2024-02-18 DIAGNOSIS — I7025 Atherosclerosis of native arteries of other extremities with ulceration: Secondary | ICD-10-CM

## 2024-02-18 DIAGNOSIS — I77811 Abdominal aortic ectasia: Secondary | ICD-10-CM

## 2024-02-18 DIAGNOSIS — I10 Essential (primary) hypertension: Secondary | ICD-10-CM | POA: Diagnosis not present

## 2024-02-18 DIAGNOSIS — J439 Emphysema, unspecified: Secondary | ICD-10-CM

## 2024-02-18 DIAGNOSIS — M150 Primary generalized (osteo)arthritis: Secondary | ICD-10-CM

## 2024-02-22 LAB — VAS US ABI WITH/WO TBI
Left ABI: 1.11
Right ABI: 1.05

## 2024-02-23 ENCOUNTER — Encounter (INDEPENDENT_AMBULATORY_CARE_PROVIDER_SITE_OTHER): Payer: Self-pay

## 2024-02-25 ENCOUNTER — Encounter: Payer: Self-pay | Admitting: Gastroenterology

## 2024-03-21 ENCOUNTER — Ambulatory Visit: Admitting: Anesthesiology

## 2024-03-21 ENCOUNTER — Other Ambulatory Visit: Payer: Self-pay

## 2024-03-21 ENCOUNTER — Encounter: Payer: Self-pay | Admitting: Gastroenterology

## 2024-03-21 ENCOUNTER — Ambulatory Visit
Admission: RE | Admit: 2024-03-21 | Discharge: 2024-03-21 | Disposition: A | Discharge status: Home / Self Care | Attending: Gastroenterology | Admitting: Gastroenterology

## 2024-03-21 ENCOUNTER — Encounter
Admission: RE | Disposition: A | Discharge status: Home / Self Care | Payer: Self-pay | Source: Home / Self Care | Attending: Gastroenterology

## 2024-03-21 DIAGNOSIS — K5730 Diverticulosis of large intestine without perforation or abscess without bleeding: Secondary | ICD-10-CM | POA: Insufficient documentation

## 2024-03-21 DIAGNOSIS — Z7901 Long term (current) use of anticoagulants: Secondary | ICD-10-CM | POA: Insufficient documentation

## 2024-03-21 DIAGNOSIS — M1990 Unspecified osteoarthritis, unspecified site: Secondary | ICD-10-CM | POA: Insufficient documentation

## 2024-03-21 DIAGNOSIS — F32A Depression, unspecified: Secondary | ICD-10-CM | POA: Insufficient documentation

## 2024-03-21 DIAGNOSIS — D122 Benign neoplasm of ascending colon: Secondary | ICD-10-CM | POA: Insufficient documentation

## 2024-03-21 DIAGNOSIS — K2970 Gastritis, unspecified, without bleeding: Secondary | ICD-10-CM | POA: Insufficient documentation

## 2024-03-21 DIAGNOSIS — K449 Diaphragmatic hernia without obstruction or gangrene: Secondary | ICD-10-CM | POA: Insufficient documentation

## 2024-03-21 DIAGNOSIS — F419 Anxiety disorder, unspecified: Secondary | ICD-10-CM | POA: Insufficient documentation

## 2024-03-21 DIAGNOSIS — I739 Peripheral vascular disease, unspecified: Secondary | ICD-10-CM | POA: Insufficient documentation

## 2024-03-21 DIAGNOSIS — I10 Essential (primary) hypertension: Secondary | ICD-10-CM | POA: Insufficient documentation

## 2024-03-21 DIAGNOSIS — K2270 Barrett's esophagus without dysplasia: Secondary | ICD-10-CM | POA: Insufficient documentation

## 2024-03-21 DIAGNOSIS — K259 Gastric ulcer, unspecified as acute or chronic, without hemorrhage or perforation: Secondary | ICD-10-CM | POA: Insufficient documentation

## 2024-03-21 DIAGNOSIS — D123 Benign neoplasm of transverse colon: Secondary | ICD-10-CM | POA: Insufficient documentation

## 2024-03-21 DIAGNOSIS — Z79899 Other long term (current) drug therapy: Secondary | ICD-10-CM | POA: Insufficient documentation

## 2024-03-21 DIAGNOSIS — D120 Benign neoplasm of cecum: Secondary | ICD-10-CM | POA: Insufficient documentation

## 2024-03-21 DIAGNOSIS — D124 Benign neoplasm of descending colon: Secondary | ICD-10-CM | POA: Insufficient documentation

## 2024-03-21 DIAGNOSIS — F17200 Nicotine dependence, unspecified, uncomplicated: Secondary | ICD-10-CM | POA: Insufficient documentation

## 2024-03-21 DIAGNOSIS — Z1211 Encounter for screening for malignant neoplasm of colon: Secondary | ICD-10-CM | POA: Insufficient documentation

## 2024-03-21 DIAGNOSIS — K219 Gastro-esophageal reflux disease without esophagitis: Secondary | ICD-10-CM | POA: Insufficient documentation

## 2024-03-21 DIAGNOSIS — K640 First degree hemorrhoids: Secondary | ICD-10-CM | POA: Insufficient documentation

## 2024-03-21 DIAGNOSIS — K2980 Duodenitis without bleeding: Secondary | ICD-10-CM | POA: Insufficient documentation

## 2024-03-21 DIAGNOSIS — R519 Headache, unspecified: Secondary | ICD-10-CM | POA: Insufficient documentation

## 2024-03-21 DIAGNOSIS — J449 Chronic obstructive pulmonary disease, unspecified: Secondary | ICD-10-CM | POA: Insufficient documentation

## 2024-03-21 HISTORY — DX: Other cervical disc degeneration, unspecified cervical region: M50.30

## 2024-03-21 HISTORY — PX: POLYPECTOMY: SHX149

## 2024-03-21 HISTORY — DX: Prediabetes: R73.03

## 2024-03-21 HISTORY — DX: Presence of unspecified artificial knee joint: Z96.659

## 2024-03-21 HISTORY — PX: COLONOSCOPY: SHX5424

## 2024-03-21 HISTORY — DX: Infection and inflammatory reaction due to other internal joint prosthesis, initial encounter: T84.59XA

## 2024-03-21 HISTORY — DX: Peripheral vascular disease, unspecified: I73.9

## 2024-03-21 HISTORY — DX: Chronic sinusitis, unspecified: J32.9

## 2024-03-21 HISTORY — DX: Atherosclerotic heart disease of native coronary artery without angina pectoris: I25.10

## 2024-03-21 HISTORY — PX: HEMOSTASIS CLIP PLACEMENT: SHX6857

## 2024-03-21 HISTORY — PX: SUBMUCOSAL INJECTION: SHX5543

## 2024-03-21 HISTORY — PX: ESOPHAGOGASTRODUODENOSCOPY: SHX5428

## 2024-03-21 SURGERY — COLONOSCOPY
Anesthesia: General

## 2024-03-21 MED ORDER — PHENYLEPHRINE 80 MCG/ML (10ML) SYRINGE FOR IV PUSH (FOR BLOOD PRESSURE SUPPORT)
PREFILLED_SYRINGE | INTRAVENOUS | Status: DC | PRN
  Administered 2024-03-21: 80 ug via INTRAVENOUS
  Administered 2024-03-21: 160 ug via INTRAVENOUS

## 2024-03-21 MED ORDER — PROPOFOL 10 MG/ML IV BOLUS
INTRAVENOUS | Status: DC | PRN
  Administered 2024-03-21 (×3): 20 mg via INTRAVENOUS
  Administered 2024-03-21: 40 mg via INTRAVENOUS

## 2024-03-21 MED ORDER — SODIUM CHLORIDE 0.9 % IV SOLN: INTRAVENOUS | Status: DC

## 2024-03-21 MED ORDER — GLYCOPYRROLATE 0.2 MG/ML IJ SOLN
INTRAMUSCULAR | Status: DC | PRN
  Administered 2024-03-21: .2 mg via INTRAVENOUS

## 2024-03-21 MED ORDER — DEXMEDETOMIDINE HCL IN NACL 80 MCG/20ML IV SOLN
INTRAVENOUS | Status: DC | PRN
  Administered 2024-03-21: 20 ug via INTRAVENOUS

## 2024-03-21 MED ORDER — PROPOFOL 500 MG/50ML IV EMUL
INTRAVENOUS | Status: DC | PRN
  Administered 2024-03-21: 75 ug/kg/min via INTRAVENOUS

## 2024-03-21 MED ORDER — LIDOCAINE HCL (CARDIAC) PF 100 MG/5ML IV SOSY
PREFILLED_SYRINGE | INTRAVENOUS | Status: DC | PRN
  Administered 2024-03-21: 60 mg via INTRAVENOUS

## 2024-03-21 NOTE — Transfer of Care (Signed)
 Immediate Anesthesia Transfer of Care Note  Patient: Anthony Bender  Procedure(s) Performed: COLONOSCOPY EGD (ESOPHAGOGASTRODUODENOSCOPY) POLYPECTOMY, INTESTINE INJECTION, SUBMUCOSAL CONTROL OF HEMORRHAGE, GI TRACT, ENDOSCOPIC, BY CLIPPING OR OVERSEWING  Patient Location: PACU  Anesthesia Type:General  Level of Consciousness: sedated  Airway & Oxygen Therapy: Patient Spontanous Breathing  Post-op Assessment: Report given to RN and Post -op Vital signs reviewed and stable  Post vital signs: Reviewed and stable  Last Vitals:  Vitals Value Taken Time  BP    Temp 36.4 C 03/21/24 12:18  Pulse 79 03/21/24 12:18  Resp 14 03/21/24 12:18  SpO2 95 % 03/21/24 12:18    Last Pain:  Vitals:   03/21/24 1218  TempSrc: Temporal  PainSc: Asleep         Complications: No notable events documented.

## 2024-03-21 NOTE — Anesthesia Postprocedure Evaluation (Signed)
 Anesthesia Post Note  Patient: Anthony Bender  Procedure(s) Performed: COLONOSCOPY EGD (ESOPHAGOGASTRODUODENOSCOPY) POLYPECTOMY, INTESTINE INJECTION, SUBMUCOSAL CONTROL OF HEMORRHAGE, GI TRACT, ENDOSCOPIC, BY CLIPPING OR OVERSEWING  Patient location during evaluation: Endoscopy Anesthesia Type: General Level of consciousness: awake and alert Pain management: pain level controlled Vital Signs Assessment: post-procedure vital signs reviewed and stable Respiratory status: spontaneous breathing, nonlabored ventilation, respiratory function stable and patient connected to nasal cannula oxygen Cardiovascular status: blood pressure returned to baseline and stable Postop Assessment: no apparent nausea or vomiting Anesthetic complications: no   No notable events documented.   Last Vitals:  Vitals:   03/21/24 1228 03/21/24 1239  BP: 109/83 114/78  Pulse: 62 61  Resp: 14   Temp:  36.4 C  SpO2: 92% 99%    Last Pain:  Vitals:   03/21/24 1239  TempSrc: Temporal  PainSc: 0-No pain                 Vanice Genre

## 2024-03-21 NOTE — Op Note (Signed)
 [facility] Gastroenterology Patient Name: Anthony Bender Procedure Date: 03/21/2024 11:04 AM MRN: [id number] Account #: [id number] Date of Birth: 04/30/1956 Admit Type: Outpatient Age: 68 Room: [facility] ENDO ROOM 2 Gender: Male Note Status: Finalized Instrument Name: Upper Endoscope [id number] Procedure:             Upper GI endoscopy Indications:           Surveillance for malignancy due to personal history of                         Barrett's esophagus Providers:             [name] DO, DO Medicines:             Monitored Anesthesia Care Complications:         No immediate complications. Estimated blood loss:                         Minimal. Procedure:             Pre-Anesthesia Assessment:                        - Prior to the procedure, a History and Physical was                         performed, and patient medications and allergies were                         reviewed. The patient is competent. The risks and                         benefits of the procedure and the sedation options and                         risks were discussed with the patient. All questions                         were answered and informed consent was obtained.                         Patient identification and proposed procedure were                         verified by the physician, the nurse, the anesthetist                         and the technician in the endoscopy suite. Mental                         Status Examination: alert and oriented. Airway                         Examination: normal oropharyngeal airway and neck                         mobility. Respiratory Examination: clear to                         auscultation. CV Examination: RRR, no murmurs, no S3  or S4. Prophylactic Antibiotics: The patient does not                         require prophylactic antibiotics. Prior                         Anticoagulants: The patient has taken no  anticoagulant                         or antiplatelet agents. ASA Grade Assessment: III - A                         patient with severe systemic disease. After reviewing                         the risks and benefits, the patient was deemed in                         satisfactory condition to undergo the procedure. The                         anesthesia plan was to use monitored anesthesia care                         (MAC). Immediately prior to administration of                         medications, the patient was re-assessed for adequacy                         to receive sedatives. The heart rate, respiratory                         rate, oxygen saturations, blood pressure, adequacy of                         pulmonary ventilation, and response to care were                         monitored throughout the procedure. The physical                         status of the patient was re-assessed after the                         procedure.                        After obtaining informed consent, the endoscope was                         passed under direct vision. Throughout the procedure,                         the patient's blood pressure, pulse, and oxygen                         saturations were monitored continuously. The Endoscope  was introduced through the mouth, and advanced to the                         second part of duodenum. The upper GI endoscopy was                         accomplished without difficulty. The patient tolerated                         the procedure well. Findings:      Localized moderate inflammation characterized by erythema was found in       the duodenal bulb and in the first portion of the duodenum. Estimated       blood loss: none.      The exam of the duodenum was otherwise normal.      Localized moderate inflammation characterized by erosions and erythema       was found in the entire examined stomach. Biopsies were taken with a        cold forceps for Helicobacter pylori testing. Estimated blood loss was       minimal.      Two non-bleeding superficial gastric ulcers with a clean ulcer base       (Forrest Class III) were found at the incisura and in the gastric       antrum. The largest lesion was 2 mm in largest dimension. Estimated       blood loss: none.      The exam of the stomach was otherwise normal.      Esophagogastric landmarks were identified: the gastroesophageal junction       was found at 40 cm from the incisors.      There were esophageal mucosal changes classified as Barrett's stage       C4-M5 per Prague criteria present in the lower third of the esophagus.       The maximum longitudinal extent of these mucosal changes was 5 cm in       length. Mucosa was biopsied with a cold forceps for histology. A total       of 3 specimen bottles were sent to pathology. taken in 4 quadrant       fashion. Estimated blood loss was minimal.      The exam of the esophagus was otherwise normal. Impression:            - Duodenitis.                        - Gastritis. Biopsied.                        - Non-bleeding gastric ulcers with a clean ulcer base                         (Forrest Class III).                        - Esophagogastric landmarks identified.                        - Esophageal mucosal changes classified as Barrett's                         stage C4-M5  per Prague criteria. Biopsied. Recommendation:        - Patient has a contact number available for                         emergencies. The signs and symptoms of potential                         delayed complications were discussed with the patient.                         Return to normal activities tomorrow. Written                         discharge instructions were provided to the patient.                        - Discharge patient to home.                        - Resume previous diet.                        - Continue present medications.                         - Await pathology results.                        - Repeat upper endoscopy for surveillance based on                         pathology results.                        - Return to GI office as previously scheduled.                        - The findings and recommendations were discussed with                         the patient. Procedure Code(s):     --- Professional ---                        [phone], Esophagogastroduodenoscopy, flexible,                         transoral; with biopsy, single or multiple Diagnosis Code(s):     --- Professional ---                        K29.80, Duodenitis without bleeding                        K29.70, Gastritis, unspecified, without bleeding                        K25.9, Gastric ulcer, unspecified as acute or chronic,                         without hemorrhage or perforation  K22.70, Barrett's esophagus without dysplasia CPT copyright 2022 American Medical Association. All rights reserved. The codes documented in this report are preliminary and upon coder review may  be revised to meet current compliance requirements. Attending Participation:      I personally performed the entire procedure. Polo Brisk, DO [name] DO, DO 03/21/2024 11:44:32 AM This report has been signed electronically. Number of Addenda: 0 Note Initiated On: 03/21/2024 11:04 AM Estimated Blood Loss:  Estimated blood loss was minimal.      [facility]

## 2024-03-21 NOTE — Interval H&P Note (Signed)
 History and Physical Interval Note: Preprocedure H&P from 03/21/24  was reviewed and there was no interval change after seeing and examining the patient.  Written consent was obtained from the patient after discussion of risks, benefits, and alternatives. Patient has consented to proceed with Esophagogastroduodenoscopy and Colonoscopy with possible intervention   03/21/2024 11:18 AM  Anthony Bender  has presented today for surgery, with the diagnosis of Barrett's esophagus without dysplasia (K22.70) Adenomatous polyp of colon, unspecified part of colon (D12.6).  The various methods of treatment have been discussed with the patient and family. After consideration of risks, benefits and other options for treatment, the patient has consented to  Procedure(s): COLONOSCOPY (N/A) EGD (ESOPHAGOGASTRODUODENOSCOPY) (N/A) as a surgical intervention.  The patient's history has been reviewed, patient examined, no change in status, stable for surgery.  I have reviewed the patient's chart and labs.  Questions were answered to the patient's satisfaction.     [name]

## 2024-03-21 NOTE — Op Note (Signed)
 [facility] Gastroenterology Patient Name: Anthony Bender Procedure Date: 03/21/2024 11:04 AM MRN: [id number] Account #: [id number] Date of Birth: [birth year] Admit Type: Outpatient Age: 68 Room: [facility] ENDO ROOM 2 Gender: Male Note Status: Finalized Instrument Name: Colonscope [id number] Procedure:             Colonoscopy Indications:           High risk colon cancer surveillance: Personal history                         of colonic polyps Providers:             [name] DO, DO Medicines:             Monitored Anesthesia Care Complications:         No immediate complications. Estimated blood loss:                         Minimal. Procedure:             Pre-Anesthesia Assessment:                        - Prior to the procedure, a History and Physical was                         performed, and patient medications and allergies were                         reviewed. The patient is competent. The risks and                         benefits of the procedure and the sedation options and                         risks were discussed with the patient. All questions                         were answered and informed consent was obtained.                         Patient identification and proposed procedure were                         verified by the physician, the nurse, the anesthetist                         and the technician in the endoscopy suite. Mental                         Status Examination: alert and oriented. Airway                         Examination: normal oropharyngeal airway and neck                         mobility. Respiratory Examination: clear to                         auscultation. CV Examination: RRR, no murmurs, no S3  or S4. Prophylactic Antibiotics: The patient does not                         require prophylactic antibiotics. Prior                         Anticoagulants: The patient has taken no anticoagulant                          or antiplatelet agents. ASA Grade Assessment: III - A                         patient with severe systemic disease. After reviewing                         the risks and benefits, the patient was deemed in                         satisfactory condition to undergo the procedure. The                         anesthesia plan was to use monitored anesthesia care                         (MAC). Immediately prior to administration of                         medications, the patient was re-assessed for adequacy                         to receive sedatives. The heart rate, respiratory                         rate, oxygen saturations, blood pressure, adequacy of                         pulmonary ventilation, and response to care were                         monitored throughout the procedure. The physical                         status of the patient was re-assessed after the                         procedure.                        After obtaining informed consent, the colonoscope was                         passed under direct vision. Throughout the procedure,                         the patient's blood pressure, pulse, and oxygen                         saturations were monitored continuously. The  Colonoscope was introduced through the anus and                         advanced to the the cecum, identified by appendiceal                         orifice and ileocecal valve. The colonoscopy was                         somewhat difficult due to multiple diverticula in the                         colon, spasm. Successful completion of the procedure                         was aided by lavage. The patient tolerated the                         procedure well. The quality of the bowel preparation                         was evaluated using the BBPS [facility] Bowel Preparation                         Scale) with scores of: Right Colon = 2 (minor amount                          of residual staining, small fragments of stool and/or                         opaque liquid, but mucosa seen well), Transverse Colon                         = 3 (entire mucosa seen well with no residual                         staining, small fragments of stool or opaque liquid)                         and Left Colon = 2 (minor amount of residual staining,                         small fragments of stool and/or opaque liquid, but                         mucosa seen well). The total BBPS score equals 7. The                         quality of the bowel preparation was good. The                         ileocecal valve, appendiceal orifice, and rectum were                         photographed. Findings:      The perianal and digital rectal examinations were normal. Pertinent  negatives include normal sphincter tone.      Non-bleeding internal hemorrhoids were found during retroflexion. The       hemorrhoids were Grade I (internal hemorrhoids that do not prolapse).       Estimated blood loss: none.      Multiple small-mouthed diverticula were found in the left colon.       Estimated blood loss: none.      Retroflexion in the right colon was performed.      Six sessile polyps were found in the transverse colon (2) and ascending       colon (4). The polyps were 1 to 3 mm in size. These polyps were removed       with a jumbo cold forceps. Resection and retrieval were complete.       Estimated blood loss was minimal.      Three sessile polyps were found in the descending colon and transverse       colon (2). The polyps were 3 to 6 mm in size. These polyps were removed       with a cold snare. Resection and retrieval were complete. Estimated       blood loss was minimal.      A 13 to 15 mm polyp was found in the cecum. The polyp was sessile. Area       was successfully injected with 1 mL Eleview for lesion assessment, and       this injection appeared to lift the lesion adequately.  Imaging was       performed using white light and narrow band imaging to visualize the       mucosa. Narrow band imaging used to visualize margins of polyp prior to       EMR, The polyp was removed with a hot snare. Resection and retrieval       were complete. Estimated blood loss was minimal. To prevent bleeding       after the polypectomy, one hemostatic clip was successfully placed (MR       conditional). There was no bleeding at the end of the procedure.      The exam was otherwise without abnormality on direct and retroflexion       views. Impression:            - Non-bleeding internal hemorrhoids.                        - Diverticulosis in the left colon.                        - Six 1 to 3 mm polyps in the transverse colon and in                         the ascending colon, removed with a jumbo cold                         forceps. Resected and retrieved.                        - Three 3 to 6 mm polyps in the descending colon and                         in the transverse colon, removed with a cold snare.  Resected and retrieved.                        - One 13 to 15 mm polyp in the cecum, removed with a                         hot snare. Resected and retrieved. Injected. Clip (MR                         conditional) was placed.                        - The examination was otherwise normal on direct and                         retroflexion views. Recommendation:        - Patient has a contact number available for                         emergencies. The signs and symptoms of potential                         delayed complications were discussed with the patient.                         Return to normal activities tomorrow. Written                         discharge instructions were provided to the patient.                        - Discharge patient to home.                        - Resume previous diet.                        - Continue present medications.                         - Await pathology results.                        - Repeat colonoscopy for surveillance based on                         pathology results.                        - No aspirin, ibuprofen, naproxen, or other                         non-steroidal anti-inflammatory drugs for 5 days after                         polyp removal.                        - Return to GI office as previously scheduled.                        -  The findings and recommendations were discussed with                         the patient. Procedure Code(s):     --- Professional ---                        [phone], Colonoscopy, flexible; with removal of                         tumor(s), polyp(s), or other lesion(s) by snare                         technique                        45380, 59, Colonoscopy, flexible; with biopsy, single                         or multiple                        45381, Colonoscopy, flexible; with directed submucosal                         injection(s), any substance Diagnosis Code(s):     --- Professional ---                        Z86.010, Personal history of colonic polyps                        K64.0, First degree hemorrhoids                        D12.2, Benign neoplasm of ascending colon                        D12.4, Benign neoplasm of descending colon                        D12.3, Benign neoplasm of transverse colon (hepatic                         flexure or splenic flexure)                        D12.0, Benign neoplasm of cecum                        K57.30, Diverticulosis of large intestine without                         perforation or abscess without bleeding CPT copyright 2022 American Medical Association. All rights reserved. The codes documented in this report are preliminary and upon coder review may  be revised to meet current compliance requirements. Attending Participation:      I personally performed the entire procedure. Polo Brisk, DO [name] DO, DO 03/21/2024 12:22:44 PM This report has been signed electronically. Number of Addenda: 0 Note Initiated On: 03/21/2024 11:04 AM Scope Withdrawal Time: 0 hours 23 minutes 14 seconds  Total Procedure Duration: 0 hours 28 minutes 27 seconds  Estimated Blood Loss:  Estimated blood loss was minimal.      [facility]

## 2024-03-21 NOTE — Anesthesia Preprocedure Evaluation (Signed)
 Anesthesia Evaluation  Patient identified by MRN, date of birth, ID band Patient awake    Reviewed: Allergy & Precautions, NPO status , Patient's Chart, lab work & pertinent test results  History of Anesthesia Complications Negative for: history of anesthetic complications  Airway Mallampati: II  TM Distance: >3 FB Neck ROM: Full    Dental  (+) Edentulous Upper, Edentulous Lower, Dental Advidsory Given   Pulmonary neg shortness of breath, neg sleep apnea, COPD,  COPD inhaler, neg recent URI, Current Smoker and Patient abstained from smoking.   Pulmonary exam normal breath sounds clear to auscultation       Cardiovascular Exercise Tolerance: Good METShypertension, Pt. on medications (-) angina + Peripheral Vascular Disease  (-) CAD, (-) Past MI and (-) Cardiac Stents (-) dysrhythmias (-) Valvular Problems/Murmurs Rhythm:Regular Rate:Normal - Systolic murmurs    Neuro/Psych  Headaches, neg Seizures PSYCHIATRIC DISORDERS Anxiety Depression     Neuromuscular disease    GI/Hepatic hiatal hernia, PUD,GERD  Medicated and Controlled,,(+)     (-) substance abuse    Endo/Other  neg diabetes    Renal/GU negative Renal ROS     Musculoskeletal  (+) Arthritis ,    Abdominal   Peds  Hematology   Anesthesia Other Findings Past Medical History: No date: Anxiety No date: Aortic ectasia (HCC) No date: Asthma No date: Barrett's esophagus No date: BPH (benign prostatic hyperplasia) No date: COPD (chronic obstructive pulmonary disease) (HCC) No date: DDD (degenerative disc disease), lumbar No date: GERD (gastroesophageal reflux disease) No date: Headache No date: Hemorrhoids No date: Hyperlipidemia No date: Hyperlipidemia No date: Hypertension No date: Insomnia No date: Mild episode of recurrent major depressive disorder (HCC) No date: Neuropathy No date: Neuropathy, peripheral, idiopathic No date: Osteoarthritis No date:  Primary osteoarthritis of right knee No date: Smoker No date: Tobacco use No date: Umbilical hernia No date: Vitamin D deficiency  Reproductive/Obstetrics                             Anesthesia Physical Anesthesia Plan  ASA: 3  Anesthesia Plan: General   Post-op Pain Management: Ofirmev  IV (intra-op)*   Induction: Intravenous  PONV Risk Score and Plan: 1 and Treatment may vary due to age or medical condition, Propofol  infusion and TIVA  Airway Management Planned: Natural Airway and Nasal Cannula  Additional Equipment: None  Intra-op Plan:   Post-operative Plan:   Informed Consent: I have reviewed the patients History and Physical, chart, labs and discussed the procedure including the risks, benefits and alternatives for the proposed anesthesia with the patient or authorized representative who has indicated his/her understanding and acceptance.       Plan Discussed with: CRNA and Surgeon  Anesthesia Plan Comments:        Anesthesia Quick Evaluation

## 2024-03-21 NOTE — H&P (Signed)
 Pre-Procedure H&P   Patient ID: Anthony Bender is a 68 y.o. male.  Gastroenterology Provider: [name], DO  Referring Provider: Dr. April Knack PCP: Al Hover, MD  Date: 03/21/2024  HPI Mr. Anthony Bender is a 68 y.o. male who presents today for Esophagogastroduodenoscopy and Colonoscopy for barretts esophagus; personal history of colon polyps .  Patient with history of Barrett's esophagus without dysplasia status post RFA.  Last evaluated in November 2018 with biopsies negative for Barrett's esophagus with recommendation for 5-year repeat. He did have an EGD in February 2024 with 2 Forrest class III gastric ulcers secondary to Mobic and Goody powder use.  More recently has had increased belching.  No melena or hematochezia.  Frequently misses his PPI dose per patient report resulting in reflux symptoms.  Last colonoscopy in 2018 with external hemorrhoids and 2 adenomatous polyps noted  Hemoglobin 15.2 MCV 92 platelets 219,000 creatinine 1.0  Continues to take Goody powders a daily- cut back from 5 to 1-2 since his office visit in May  No family history of GI disease or malignancy   Past Medical History:  Diagnosis Date   Anxiety    Aortic ectasia (HCC)    Asthma    Barrett's esophagus    BPH (benign prostatic hyperplasia)    Chronic sinusitis    Claudication of right lower extremity (HCC)    COPD (chronic obstructive pulmonary disease) (HCC)    Coronary artery calcification    DDD (degenerative disc disease), cervical    DDD (degenerative disc disease), lumbar    GERD (gastroesophageal reflux disease)    Headache    Hemorrhoids    Hyperlipidemia    Hypertension    Infection of total knee replacement (HCC)    Insomnia    Mild episode of recurrent major depressive disorder (HCC)    Neuropathy    Neuropathy, peripheral, idiopathic    Osteoarthritis    Pre-diabetes    Primary osteoarthritis of right knee    Smoker    Tobacco use    Umbilical  hernia    Vitamin D deficiency     Past Surgical History:  Procedure Laterality Date   AMPUTATION TOE Left 09/06/2022   Procedure: AMPUTATION TOE;  Surgeon: Anell Baptist, DPM;  Location: [facility] ORS;  Service: Podiatry;  Laterality: Left;   APPENDECTOMY     BACK SURGERY     injection   ESOPHAGOGASTRODUODENOSCOPY N/A 03/15/2015   Procedure: ESOPHAGOGASTRODUODENOSCOPY (EGD);  Surgeon: Stephens Eis, MD;  Location: [facility] ENDOSCOPY;  Service: Gastroenterology;  Laterality: N/A;   ESOPHAGOGASTRODUODENOSCOPY (EGD) WITH PROPOFOL  N/A 08/23/2015   Procedure: ESOPHAGOGASTRODUODENOSCOPY (EGD) WITH PROPOFOL ;  Surgeon: Stephens Eis, MD;  Location: [facility] ENDOSCOPY;  Service: Gastroenterology;  Laterality: N/A;   Barrx Procedure   ESOPHAGOGASTRODUODENOSCOPY (EGD) WITH PROPOFOL  N/A 01/24/2016   Procedure: ESOPHAGOGASTRODUODENOSCOPY (EGD) WITH PROPOFOL ;  Surgeon: Stephens Eis, MD;  Location: [facility] ENDOSCOPY;  Service: Gastroenterology;  Laterality: N/A;   ESOPHAGOGASTRODUODENOSCOPY (EGD) WITH PROPOFOL  N/A 12/03/2022   Procedure: ESOPHAGOGASTRODUODENOSCOPY (EGD) WITH PROPOFOL ;  Surgeon: Luke Salaam, MD;  Location: [facility] ENDOSCOPY;  Service: Gastroenterology;  Laterality: N/A;   HERNIA REPAIR     JOINT REPLACEMENT Left    Partial Knee Replacement   KNEE ARTHROSCOPY     PAROTIDECTOMY Left 06/30/2017   Procedure: PAROTIDECTOMY;  Surgeon: Lesly Raspberry, MD;  Location: [facility] ORS;  Service: ENT;  Laterality: Left;   reattachment of finger Left    little finger   REPLACEMENT UNICONDYLAR JOINT KNEE Left  salvia gland tumors removed     SUPERFICIAL PERONEAL NERVE RELEASE     SUPERFICIAL PERONEAL NERVE RELEASE Right    ARM   TOTAL KNEE ARTHROPLASTY Right 05/19/2023   Procedure: TOTAL KNEE ARTHROPLASTY;  Surgeon: Elner Hahn, MD;  Location: [facility] ORS;  Service: Orthopedics;  Laterality: Right;   TOTAL KNEE REVISION Right 06/01/2023   Procedure: TOTAL KNEE REVISION (POLY EXCHANGE) WASH OUT;  Surgeon: Elner Hahn, MD;   Location: [facility] ORS;  Service: Orthopedics;  Laterality: Right;    Family History No h/o GI disease or malignancy  Review of Systems  Constitutional:  Negative for activity change, appetite change, chills, diaphoresis, fatigue, fever and unexpected weight change.  HENT:  Negative for trouble swallowing and voice change.   Respiratory:  Negative for shortness of breath and wheezing.   Cardiovascular:  Negative for chest pain, palpitations and leg swelling.  Gastrointestinal:  Negative for abdominal distention, abdominal pain, anal bleeding, blood in stool, constipation, diarrhea, nausea and vomiting.  Musculoskeletal:  Negative for arthralgias and myalgias.  Skin:  Negative for color change and pallor.  Neurological:  Negative for dizziness, syncope and weakness.  Psychiatric/Behavioral:  Negative for confusion. The patient is not nervous/anxious.   All other systems reviewed and are negative.    Medications No current facility-administered medications on file prior to encounter.   Current Outpatient Medications on File Prior to Encounter  Medication Sig Dispense Refill   cyanocobalamin (VITAMIN B12) 1000 MCG tablet Take 1,000 mcg by mouth daily.     DULoxetine  (CYMBALTA ) 60 MG capsule Take 60 mg by mouth daily.     fluticasone  (FLONASE ) 50 MCG/ACT nasal spray Place 2 sprays into both nostrils 2 (two) times daily.   0   LYRICA  150 MG capsule Take 150 mg by mouth 3 (three) times daily.   0   nortriptyline  (PAMELOR ) 10 MG capsule Take 20 mg by mouth at bedtime.     omeprazole  (PRILOSEC ) 40 MG capsule Take 40 mg by mouth 2 (two) times daily.     rOPINIRole  (REQUIP ) 0.5 MG tablet Take 0.5 mg by mouth 4 (four) times daily as needed.     rosuvastatin  (CRESTOR ) 10 MG tablet Take 10 mg by mouth at bedtime.     telmisartan  (MICARDIS ) 80 MG tablet Take 0.5 tablets (40 mg total) by mouth daily.     TRELEGY ELLIPTA 100-62.5-25 MCG/ACT AEPB Inhale 1 puff into the lungs daily.     albuterol   (PROVENTIL  HFA;VENTOLIN  HFA) 108 (90 Base) MCG/ACT inhaler Inhale into the lungs every 6 (six) hours as needed for wheezing or shortness of breath.     Albuterol  Sulfate 2.5 MG/0.5ML NEBU Inhale 1 each into the lungs every 6 (six) hours as needed (SOB).     apixaban  (ELIQUIS ) 2.5 MG TABS tablet Take 1 tablet (2.5 mg total) by mouth 2 (two) times daily. (Patient not taking: Reported on 08/17/2023) 30 tablet 0   levofloxacin  (LEVAQUIN ) 500 MG tablet Take 1 tablet (500 mg total) by mouth daily. (Patient not taking: Reported on 03/21/2024) 14 tablet 0   zolpidem  (AMBIEN ) 10 MG tablet Take 0.5 tablets (5 mg total) by mouth at bedtime. PATIENT HAS SUPPLIES 30 tablet 0    Pertinent medications related to GI and procedure were reviewed by me with the patient prior to the procedure   Current Facility-Administered Medications:    0.9 %  sodium chloride  infusion, , Intravenous, Continuous, [name], DO, Last Rate: 20 mL/hr at 03/21/24 1023, New Bag  at 03/21/24 1023  sodium chloride  20 mL/hr at 03/21/24 1023       Allergies  Allergen Reactions   Yellow Jacket Venom [Bee Venom] Anaphylaxis and Shortness Of Breath   Codeine Itching   Metaxalone Swelling   Venlafaxine Other (See Comments)    Loss of appetite, jittery   Zyban [Bupropion] Other (See Comments)    hallucinations   Fluoxetine Anxiety    anger   Allergies were reviewed by me prior to the procedure  Objective   Body mass index is 26.26 kg/m. Vitals:   03/21/24 1022  BP: 129/84  Pulse: 78  Resp: 18  Temp: 97.8 F (36.6 C)  TempSrc: Temporal  SpO2: 99%  Weight: 83 kg  Height: 5' 10 (1.778 m)     Physical Exam Vitals and nursing note reviewed.  Constitutional:      General: He is not in acute distress.    Appearance: Normal appearance. He is not ill-appearing, toxic-appearing or diaphoretic.  HENT:     Head: Normocephalic and atraumatic.     Nose: Nose normal.     Mouth/Throat:     Mouth: Mucous membranes  are moist.     Pharynx: Oropharynx is clear.   Eyes:     General: No scleral icterus.    Extraocular Movements: Extraocular movements intact.    Cardiovascular:     Rate and Rhythm: Normal rate and regular rhythm.     Heart sounds: Normal heart sounds. No murmur heard.    No friction rub. No gallop.  Pulmonary:     Effort: Pulmonary effort is normal. No respiratory distress.     Breath sounds: Normal breath sounds. No wheezing, rhonchi or rales.  Abdominal:     General: Bowel sounds are normal. There is no distension.     Palpations: Abdomen is soft.     Tenderness: There is no abdominal tenderness. There is no guarding or rebound.   Musculoskeletal:     Cervical back: Neck supple.     Right lower leg: No edema.     Left lower leg: No edema.   Skin:    General: Skin is warm and dry.     Coloration: Skin is not jaundiced or pale.   Neurological:     General: No focal deficit present.     Mental Status: He is alert and oriented to person, place, and time. Mental status is at baseline.   Psychiatric:        Mood and Affect: Mood normal.        Behavior: Behavior normal.        Thought Content: Thought content normal.        Judgment: Judgment normal.      Assessment:  Mr. Anthony Bender is a 68 y.o. male  who presents today for Esophagogastroduodenoscopy and Colonoscopy for barretts esophagus; personal history of colon polyps .  Plan:  Esophagogastroduodenoscopy and Colonoscopy with possible intervention today  Esophagogastroduodenoscopy and Colonoscopy with possible biopsy, control of bleeding, polypectomy, and interventions as necessary has been discussed with the patient/patient representative. Informed consent was obtained from the patient/patient representative after explaining the indication, nature, and risks of the procedure including but not limited to death, bleeding, perforation, missed neoplasm/lesions, cardiorespiratory compromise, and reaction to  medications. Opportunity for questions was given and appropriate answers were provided. Patient/patient representative has verbalized understanding is amenable to undergoing the procedure.   [name], DO  [facility]  Portions of the record may have  been created with voice recognition software. Occasional wrong-word or 'sound-a-like' substitutions may have occurred due to the inherent limitations of voice recognition software.  Read the chart carefully and recognize, using context, where substitutions may have occurred.

## 2024-03-22 ENCOUNTER — Encounter: Payer: Self-pay | Admitting: Gastroenterology

## 2024-03-22 LAB — SURGICAL PATHOLOGY

## 2024-03-23 ENCOUNTER — Other Ambulatory Visit: Payer: Self-pay

## 2024-03-23 ENCOUNTER — Emergency Department

## 2024-03-23 ENCOUNTER — Inpatient Hospital Stay
Admission: EM | Admit: 2024-03-23 | Discharge: 2024-03-25 | DRG: 379 | Disposition: A | Discharge status: Home / Self Care | Attending: Internal Medicine | Admitting: Internal Medicine

## 2024-03-23 DIAGNOSIS — G629 Polyneuropathy, unspecified: Secondary | ICD-10-CM | POA: Diagnosis present

## 2024-03-23 DIAGNOSIS — K259 Gastric ulcer, unspecified as acute or chronic, without hemorrhage or perforation: Secondary | ICD-10-CM | POA: Diagnosis not present

## 2024-03-23 DIAGNOSIS — K2980 Duodenitis without bleeding: Secondary | ICD-10-CM | POA: Diagnosis present

## 2024-03-23 DIAGNOSIS — R7303 Prediabetes: Secondary | ICD-10-CM | POA: Diagnosis present

## 2024-03-23 DIAGNOSIS — F17210 Nicotine dependence, cigarettes, uncomplicated: Secondary | ICD-10-CM | POA: Diagnosis present

## 2024-03-23 DIAGNOSIS — J449 Chronic obstructive pulmonary disease, unspecified: Secondary | ICD-10-CM | POA: Insufficient documentation

## 2024-03-23 DIAGNOSIS — Z833 Family history of diabetes mellitus: Secondary | ICD-10-CM

## 2024-03-23 DIAGNOSIS — Z91030 Bee allergy status: Secondary | ICD-10-CM

## 2024-03-23 DIAGNOSIS — K2970 Gastritis, unspecified, without bleeding: Secondary | ICD-10-CM | POA: Diagnosis present

## 2024-03-23 DIAGNOSIS — D122 Benign neoplasm of ascending colon: Secondary | ICD-10-CM | POA: Diagnosis present

## 2024-03-23 DIAGNOSIS — I2510 Atherosclerotic heart disease of native coronary artery without angina pectoris: Secondary | ICD-10-CM | POA: Diagnosis present

## 2024-03-23 DIAGNOSIS — K254 Chronic or unspecified gastric ulcer with hemorrhage: Secondary | ICD-10-CM | POA: Diagnosis present

## 2024-03-23 DIAGNOSIS — K5730 Diverticulosis of large intestine without perforation or abscess without bleeding: Secondary | ICD-10-CM | POA: Diagnosis present

## 2024-03-23 DIAGNOSIS — K922 Gastrointestinal hemorrhage, unspecified: Secondary | ICD-10-CM | POA: Diagnosis present

## 2024-03-23 DIAGNOSIS — I10 Essential (primary) hypertension: Secondary | ICD-10-CM | POA: Diagnosis present

## 2024-03-23 DIAGNOSIS — D124 Benign neoplasm of descending colon: Secondary | ICD-10-CM | POA: Diagnosis present

## 2024-03-23 DIAGNOSIS — K219 Gastro-esophageal reflux disease without esophagitis: Secondary | ICD-10-CM | POA: Diagnosis present

## 2024-03-23 DIAGNOSIS — D120 Benign neoplasm of cecum: Secondary | ICD-10-CM | POA: Diagnosis present

## 2024-03-23 DIAGNOSIS — Z7901 Long term (current) use of anticoagulants: Secondary | ICD-10-CM

## 2024-03-23 DIAGNOSIS — E785 Hyperlipidemia, unspecified: Secondary | ICD-10-CM | POA: Diagnosis present

## 2024-03-23 DIAGNOSIS — N400 Enlarged prostate without lower urinary tract symptoms: Secondary | ICD-10-CM | POA: Diagnosis present

## 2024-03-23 DIAGNOSIS — Z8249 Family history of ischemic heart disease and other diseases of the circulatory system: Secondary | ICD-10-CM | POA: Diagnosis not present

## 2024-03-23 DIAGNOSIS — Z7982 Long term (current) use of aspirin: Secondary | ICD-10-CM | POA: Diagnosis not present

## 2024-03-23 DIAGNOSIS — I739 Peripheral vascular disease, unspecified: Secondary | ICD-10-CM | POA: Diagnosis present

## 2024-03-23 DIAGNOSIS — G4700 Insomnia, unspecified: Secondary | ICD-10-CM | POA: Diagnosis present

## 2024-03-23 DIAGNOSIS — K920 Hematemesis: Secondary | ICD-10-CM | POA: Diagnosis not present

## 2024-03-23 DIAGNOSIS — Z888 Allergy status to other drugs, medicaments and biological substances status: Secondary | ICD-10-CM

## 2024-03-23 DIAGNOSIS — Z79899 Other long term (current) drug therapy: Secondary | ICD-10-CM

## 2024-03-23 DIAGNOSIS — D123 Benign neoplasm of transverse colon: Secondary | ICD-10-CM | POA: Diagnosis present

## 2024-03-23 DIAGNOSIS — J4489 Other specified chronic obstructive pulmonary disease: Secondary | ICD-10-CM | POA: Diagnosis present

## 2024-03-23 DIAGNOSIS — Z96651 Presence of right artificial knee joint: Secondary | ICD-10-CM | POA: Diagnosis present

## 2024-03-23 DIAGNOSIS — K640 First degree hemorrhoids: Secondary | ICD-10-CM | POA: Diagnosis present

## 2024-03-23 DIAGNOSIS — K2270 Barrett's esophagus without dysplasia: Secondary | ICD-10-CM | POA: Diagnosis present

## 2024-03-23 DIAGNOSIS — Z89422 Acquired absence of other left toe(s): Secondary | ICD-10-CM

## 2024-03-23 DIAGNOSIS — Z7951 Long term (current) use of inhaled steroids: Secondary | ICD-10-CM

## 2024-03-23 DIAGNOSIS — Z860101 Personal history of adenomatous and serrated colon polyps: Secondary | ICD-10-CM

## 2024-03-23 DIAGNOSIS — Z885 Allergy status to narcotic agent status: Secondary | ICD-10-CM

## 2024-03-23 LAB — CBC
HCT: 46.7 % (ref 39.0–52.0)
Hemoglobin: 15.9 g/dL (ref 13.0–17.0)
MCH: 31.9 pg (ref 26.0–34.0)
MCHC: 34 g/dL (ref 30.0–36.0)
MCV: 93.6 fL (ref 80.0–100.0)
Platelets: 203 10*3/uL (ref 150–400)
RBC: 4.99 MIL/uL (ref 4.22–5.81)
RDW: 15.3 % (ref 11.5–15.5)
WBC: 10.2 10*3/uL (ref 4.0–10.5)
nRBC: 0 % (ref 0.0–0.2)

## 2024-03-23 LAB — TYPE AND SCREEN
ABO/RH(D): O POS
Antibody Screen: NEGATIVE

## 2024-03-23 LAB — COMPREHENSIVE METABOLIC PANEL WITH GFR
ALT: 16 U/L (ref 0–44)
AST: 21 U/L (ref 15–41)
Albumin: 3.8 g/dL (ref 3.5–5.0)
Alkaline Phosphatase: 69 U/L (ref 38–126)
Anion gap: 9 (ref 5–15)
BUN: 36 mg/dL — ABNORMAL HIGH (ref 8–23)
CO2: 27 mmol/L (ref 22–32)
Calcium: 9 mg/dL (ref 8.9–10.3)
Chloride: 102 mmol/L (ref 98–111)
Creatinine, Ser: 0.94 mg/dL (ref 0.61–1.24)
GFR, Estimated: >60 mL/min (ref >60)
Glucose, Bld: 138 mg/dL — ABNORMAL HIGH (ref 70–99)
Potassium: 3.9 mmol/L (ref 3.5–5.1)
Sodium: 138 mmol/L (ref 135–145)
Total Bilirubin: 1.2 mg/dL (ref 0.0–1.2)
Total Protein: 7 g/dL (ref 6.5–8.1)

## 2024-03-23 LAB — PROTIME-INR
INR: 1 (ref 0.8–1.2)
Prothrombin Time: 13.2 s (ref 11.4–15.2)

## 2024-03-23 LAB — TROPONIN I (HIGH SENSITIVITY): Troponin I (High Sensitivity): 6 ng/L (ref <18)

## 2024-03-23 LAB — LACTIC ACID, PLASMA: Lactic Acid, Venous: 1.8 mmol/L (ref 0.5–1.9)

## 2024-03-23 LAB — LIPASE, BLOOD: Lipase: 25 U/L (ref 11–51)

## 2024-03-23 LAB — APTT: aPTT: 28 s (ref 24–36)

## 2024-03-23 MED ORDER — ONDANSETRON HCL 4 MG/2ML IJ SOLN
4.0000 mg | INTRAMUSCULAR | Status: AC
  Administered 2024-03-23: 4 mg via INTRAVENOUS
  Filled 2024-03-23: qty 2

## 2024-03-23 MED ORDER — ZOLPIDEM TARTRATE 5 MG PO TABS
5.0000 mg | ORAL_TABLET | Freq: Every day | ORAL | Status: DC
  Administered 2024-03-23 – 2024-03-24 (×2): 5 mg via ORAL
  Filled 2024-03-23 (×2): qty 1

## 2024-03-23 MED ORDER — ACETAMINOPHEN 650 MG RE SUPP: 650.0000 mg | Freq: Four times a day (QID) | RECTAL | Status: DC | PRN

## 2024-03-23 MED ORDER — BUDESON-GLYCOPYRROL-FORMOTEROL 160-9-4.8 MCG/ACT IN AERO
2.0000 | INHALATION_SPRAY | Freq: Two times a day (BID) | RESPIRATORY_TRACT | Status: DC
  Administered 2024-03-24 – 2024-03-25 (×2): 2 via RESPIRATORY_TRACT
  Filled 2024-03-23 (×2): qty 5.9

## 2024-03-23 MED ORDER — PREGABALIN 75 MG PO CAPS
150.0000 mg | ORAL_CAPSULE | Freq: Three times a day (TID) | ORAL | Status: DC
  Administered 2024-03-23 – 2024-03-25 (×4): 150 mg via ORAL
  Filled 2024-03-23: qty 2
  Filled 2024-03-23: qty 3
  Filled 2024-03-23 (×2): qty 2

## 2024-03-23 MED ORDER — IRBESARTAN 150 MG PO TABS
75.0000 mg | ORAL_TABLET | Freq: Every day | ORAL | Status: DC
  Administered 2024-03-24: 75 mg via ORAL
  Filled 2024-03-23 (×2): qty 1

## 2024-03-23 MED ORDER — PANTOPRAZOLE SODIUM 40 MG IV SOLR
40.0000 mg | Freq: Once | INTRAVENOUS | Status: AC
  Administered 2024-03-23: 40 mg via INTRAVENOUS
  Filled 2024-03-23: qty 10

## 2024-03-23 MED ORDER — LORATADINE 10 MG PO TABS
10.0000 mg | ORAL_TABLET | Freq: Every day | ORAL | Status: DC
  Administered 2024-03-24 – 2024-03-25 (×2): 10 mg via ORAL
  Filled 2024-03-23 (×2): qty 1

## 2024-03-23 MED ORDER — ALBUTEROL SULFATE (2.5 MG/3ML) 0.083% IN NEBU: 2.5000 mg | INHALATION_SOLUTION | Freq: Four times a day (QID) | RESPIRATORY_TRACT | Status: DC | PRN

## 2024-03-23 MED ORDER — VITAMIN B-12 1000 MCG PO TABS
1000.0000 ug | ORAL_TABLET | Freq: Every day | ORAL | Status: DC
  Administered 2024-03-24 – 2024-03-25 (×2): 1000 ug via ORAL
  Filled 2024-03-23: qty 2
  Filled 2024-03-23: qty 1

## 2024-03-23 MED ORDER — ONDANSETRON HCL 4 MG/2ML IJ SOLN
4.0000 mg | Freq: Four times a day (QID) | INTRAMUSCULAR | Status: DC | PRN
  Administered 2024-03-24: 4 mg via INTRAVENOUS

## 2024-03-23 MED ORDER — PANTOPRAZOLE SODIUM 40 MG PO TBEC: 40.0000 mg | DELAYED_RELEASE_TABLET | Freq: Every day | ORAL | Status: DC

## 2024-03-23 MED ORDER — MORPHINE SULFATE (PF) 4 MG/ML IV SOLN
4.0000 mg | Freq: Once | INTRAVENOUS | Status: AC
  Administered 2024-03-23: 4 mg via INTRAVENOUS
  Filled 2024-03-23: qty 1

## 2024-03-23 MED ORDER — ROSUVASTATIN CALCIUM 10 MG PO TABS
10.0000 mg | ORAL_TABLET | Freq: Every day | ORAL | Status: DC
  Administered 2024-03-23 – 2024-03-24 (×2): 10 mg via ORAL
  Filled 2024-03-23 (×2): qty 1

## 2024-03-23 MED ORDER — ACETAMINOPHEN 325 MG PO TABS
650.0000 mg | ORAL_TABLET | Freq: Four times a day (QID) | ORAL | Status: DC | PRN
Start: 2024-03-23 — End: 2024-03-25
  Administered 2024-03-24: 650 mg via ORAL
  Filled 2024-03-23: qty 2

## 2024-03-23 MED ORDER — SODIUM CHLORIDE 0.9 % IV BOLUS
1000.0000 mL | Freq: Once | INTRAVENOUS | Status: AC
  Administered 2024-03-23: 1000 mL via INTRAVENOUS

## 2024-03-23 MED ORDER — DULOXETINE HCL 30 MG PO CPEP
60.0000 mg | ORAL_CAPSULE | Freq: Every day | ORAL | Status: DC
  Administered 2024-03-24 – 2024-03-25 (×2): 60 mg via ORAL
  Filled 2024-03-23: qty 2
  Filled 2024-03-23: qty 1

## 2024-03-23 MED ORDER — ROPINIROLE HCL 1 MG PO TABS: 0.5000 mg | ORAL_TABLET | Freq: Four times a day (QID) | ORAL | Status: DC | PRN

## 2024-03-23 MED ORDER — FLUTICASONE PROPIONATE 50 MCG/ACT NA SUSP: 2.0000 | Freq: Two times a day (BID) | NASAL | Status: DC

## 2024-03-23 MED ORDER — SODIUM CHLORIDE 0.9 % IV SOLN: INTRAVENOUS | Status: AC

## 2024-03-23 MED ORDER — TRAZODONE HCL 50 MG PO TABS: 25.0000 mg | ORAL_TABLET | Freq: Every evening | ORAL | Status: DC | PRN

## 2024-03-23 MED ORDER — ONDANSETRON HCL 4 MG PO TABS
4.0000 mg | ORAL_TABLET | Freq: Four times a day (QID) | ORAL | Status: DC | PRN
  Administered 2024-03-24: 4 mg via ORAL
  Filled 2024-03-23: qty 1

## 2024-03-23 NOTE — ED Triage Notes (Signed)
 Pt arrives via [facility] from home for hemorrage, coffee ground emeis, pt had an endoscopy   99 CBG 97.7 123/84

## 2024-03-23 NOTE — ED Provider Notes (Signed)
 [facility] Provider Note    Event Date/Time   First MD Initiated Contact with Patient 03/23/24 2057     (approximate)   History   Hematemesis   HPI  Anthony Bender is a 68 y.o. male reports that he had an endoscopy 2 days ago and was told that he had nonbleeding ulcers and irritated stomach.  Today he ate breakfast felt okay and then throughout the day he started to feel fatigued and this evening started feeling very nauseated and started vomiting up what he describes as coffee grounds concerned that he may have bleeding.  He has not taken any blood thinners or aspirin for his blood thinning medication for the last 2 days since his endoscopy.  He reports he feels nauseated but no pain.  He said some vomiting of darkish appearing flecks that he describes as looking like coffee.  He also relates that he started to have headache around the back of the head starting around the same time as the vomiting.  No chest pain or trouble breathing.  No bloody stool or diarrhea.  Denies any history of liver disease other than being told he had fatty liver      Reviewed endoscopy report by Dr. Mamie Searles from 2 days ago.  Patient does have nonbleeding ulcers noted at that time  Physical Exam   Triage Vital Signs: ED Triage Vitals [03/23/24 2100]  Encounter Vitals Group     BP 113/84     Girls Systolic BP Percentile      Girls Diastolic BP Percentile      Boys Systolic BP Percentile      Boys Diastolic BP Percentile      Pulse Rate (!) 104     Resp 18     Temp 98 F (36.7 C)     Temp Source Oral     SpO2 100 %     Weight      Height      Head Circumference      Peak Flow      Pain Score      Pain Loc      Pain Education      Exclude from Growth Chart     Most recent vital signs: Vitals:   03/23/24 2100 03/23/24 2130  BP: 113/84 131/87  Pulse: (!) 104 87  Resp: 18   Temp: 98 F (36.7 C)   SpO2: 100% 95%     General: Awake, no distress.  Appears  slightly pale.  Is in no extremis and is fully awake alert and oriented reports a moderate headache around the back of the occiput and feeling nauseated.  He has not vomited since prior to calling EMS CV:  Good peripheral perfusion.  Normal tones slight tachycardia Resp:  Normal effort.  Clear bilateral with normal work of breathing and clear speech Abd:  No distention.  Denies abdominal pain to palpation in any quadrant.  No ascites.  He does report palpation in the upper abdomen and epigastrium feels nauseating Other:  Warm well-perfused extremities   ED Results / Procedures / Treatments   Labs (all labs ordered are listed, but only abnormal results are displayed) Labs Reviewed  COMPREHENSIVE METABOLIC PANEL WITH GFR - Abnormal; Notable for the following components:      Result Value   Glucose, Bld 138 (*)    BUN 36 (*)    All other components within normal limits  CBC  LIPASE, BLOOD  PROTIME-INR  APTT  LACTIC ACID, PLASMA  HIV ANTIBODY (ROUTINE TESTING W REFLEX)  BASIC METABOLIC PANEL WITH GFR  CBC  TYPE AND SCREEN  TROPONIN I (HIGH SENSITIVITY)     EKG  And interpreted by me at 2100 heart rate 105 QRS 100 QTc 450 Normal sinus rhythm, very mild ST segment abnormality including slight depression V4 V3.  No STEMI.  Of note the patient does not complain of any chest pain   RADIOLOGY  CT Head Wo Contrast Result Date: 03/23/2024 CLINICAL DATA:  Tension headache. EXAM: CT HEAD WITHOUT CONTRAST TECHNIQUE: Contiguous axial images were obtained from the base of the skull through the vertex without intravenous contrast. RADIATION DOSE REDUCTION: This exam was performed according to the departmental dose-optimization program which includes automated exposure control, adjustment of the mA and/or kV according to patient size and/or use of iterative reconstruction technique. COMPARISON:  March 18, 2008 FINDINGS: Brain: There is generalized cerebral atrophy with widening of the extra-axial  spaces and ventricular dilatation. There are areas of decreased attenuation within the white matter tracts of the supratentorial brain, consistent with microvascular disease changes. Vascular: No hyperdense vessel or unexpected calcification. Skull: Normal. Negative for fracture or focal lesion. Sinuses/Orbits: Mild to moderate severity bilateral maxillary sinus mucosal thickening is seen. Other: None. IMPRESSION: 1. Generalized cerebral atrophy and microvascular disease changes of the supratentorial brain. 2. No acute intracranial abnormality. 3. Mild to moderate severity bilateral maxillary sinus disease. Electronically Signed   By: Virgle Grime M.D.   On: 03/23/2024 21:18      PROCEDURES:  Critical Care performed: No  Procedures   MEDICATIONS ORDERED IN ED: Medications  rosuvastatin  (CRESTOR ) tablet 10 mg (has no administration in time range)  irbesartan  (AVAPRO ) tablet 75 mg (has no administration in time range)  DULoxetine  (CYMBALTA ) DR capsule 60 mg (has no administration in time range)  zolpidem  (AMBIEN ) tablet 5 mg (has no administration in time range)  pantoprazole  (PROTONIX ) EC tablet 40 mg (has no administration in time range)  cyanocobalamin (VITAMIN B12) tablet 1,000 mcg (has no administration in time range)  pregabalin  (LYRICA ) capsule 150 mg (has no administration in time range)  rOPINIRole  (REQUIP ) tablet 0.5 mg (has no administration in time range)  loratadine (CLARITIN) tablet 10 mg (has no administration in time range)  fluticasone  (FLONASE ) 50 MCG/ACT nasal spray 2 spray (has no administration in time range)  budesonide-glycopyrrolate -formoterol (BREZTRI) 160-9-4.8 MCG/ACT inhaler 2 puff (has no administration in time range)  albuterol  (PROVENTIL ) (2.5 MG/3ML) 0.083% nebulizer solution 2.5 mg (has no administration in time range)  0.9 %  sodium chloride  infusion (has no administration in time range)  acetaminophen  (TYLENOL ) tablet 650 mg (has no administration in  time range)    Or  acetaminophen  (TYLENOL ) suppository 650 mg (has no administration in time range)  traZODone (DESYREL) tablet 25 mg (has no administration in time range)  ondansetron  (ZOFRAN ) tablet 4 mg (has no administration in time range)    Or  ondansetron  (ZOFRAN ) injection 4 mg (has no administration in time range)  pantoprazole  (PROTONIX ) injection 40 mg (40 mg Intravenous Given 03/23/24 2106)  ondansetron  (ZOFRAN ) injection 4 mg (4 mg Intravenous Given 03/23/24 2105)  sodium chloride  0.9 % bolus 1,000 mL (0 mLs Intravenous Stopped 03/23/24 2248)  morphine  (PF) 4 MG/ML injection 4 mg (4 mg Intravenous Given 03/23/24 2106)     IMPRESSION / MDM / ASSESSMENT AND PLAN / ED COURSE  I reviewed the triage vital signs and the nursing notes.  Differential diagnosis includes, but is not limited to, possible upper GI bleeding, acute gastritis, bleeding ulcer or site from previous biopsy, erosion, etc.  Recent upper endoscopy 2 days ago.  Reports he has not been taking his anticoagulant for the past 48 hours.  Reports symptoms of nausea and reports it sounds to be likely upper GI type bleeding symptoms.  He also reports headache starting around the same time and is neurovascularly intact alert well oriented but given the proximity of headache to the vomiting I think it would be prudent to obtain CT scan to be certain that no evidence of acute intracranial hemorrhage or obvious central pathology is present though I feel his symptoms are most likely gastrointestinal at this time.  A low pretest probability for perforation given no associated pain or peritonitis but rather just an unremitting nausea with vomiting at this time.    Patient's presentation is most consistent with acute complicated illness / injury requiring diagnostic workup.   Hemodynamics are normal.  Hemoglobin demonstrates stability but given the patient's symptoms and on exam he does have small amount of  coffee-ground emesis it is apparent that he appears to have acute upper GI bleeding.   Clinical Course as of 03/23/24 2334  Wed Mar 23, 2024  2330 Updated patient and family.  Understanding and agreeable with plan for hospitalization for further workup and GI consultation. [MQ]  2332 Consult placed with Dr. Corky Diener via secure message at this time.  Patient alert oriented remains hemodynamically stable.  Admitted to the hospitalist service for ongoing care and treatment of apparent upper GI bleeding. [MQ]    Clinical Course User Index [MQ] Iver Marker, MD   ----------------------------------------- 11:33 PM on 03/23/2024 -----------------------------------------  Labs remarkable for elevated BUN concerning for GI bleeding.  CT of the head negative for acute finding.  After receiving medication patient reports headache has resolved completely still having mild nausea but no further active major emesis.  Admitted to hospitalist service of the care of Dr. Achilles Holes  FINAL CLINICAL IMPRESSION(S) / ED DIAGNOSES   Final diagnoses:  Acute upper GI bleed     Rx / DC Orders   ED Discharge Orders     None        Note:  This document was prepared using Dragon voice recognition software and may include unintentional dictation errors.   Iver Marker, MD 03/23/24 [phone]

## 2024-03-24 ENCOUNTER — Inpatient Hospital Stay: Admitting: Anesthesiology

## 2024-03-24 ENCOUNTER — Encounter: Payer: Self-pay | Admitting: Family Medicine

## 2024-03-24 ENCOUNTER — Encounter
Admission: EM | Disposition: A | Discharge status: Home / Self Care | Payer: Self-pay | Source: Home / Self Care | Attending: Internal Medicine

## 2024-03-24 DIAGNOSIS — K2270 Barrett's esophagus without dysplasia: Secondary | ICD-10-CM | POA: Diagnosis not present

## 2024-03-24 DIAGNOSIS — K922 Gastrointestinal hemorrhage, unspecified: Secondary | ICD-10-CM | POA: Diagnosis not present

## 2024-03-24 DIAGNOSIS — K920 Hematemesis: Secondary | ICD-10-CM | POA: Diagnosis not present

## 2024-03-24 DIAGNOSIS — G629 Polyneuropathy, unspecified: Secondary | ICD-10-CM

## 2024-03-24 DIAGNOSIS — I10 Essential (primary) hypertension: Secondary | ICD-10-CM

## 2024-03-24 DIAGNOSIS — J449 Chronic obstructive pulmonary disease, unspecified: Secondary | ICD-10-CM | POA: Insufficient documentation

## 2024-03-24 DIAGNOSIS — K259 Gastric ulcer, unspecified as acute or chronic, without hemorrhage or perforation: Secondary | ICD-10-CM

## 2024-03-24 HISTORY — PX: HOT HEMOSTASIS: SHX5433

## 2024-03-24 HISTORY — PX: HEMOSTASIS CLIP PLACEMENT: SHX6857

## 2024-03-24 HISTORY — PX: ESOPHAGOGASTRODUODENOSCOPY: SHX5428

## 2024-03-24 LAB — BASIC METABOLIC PANEL WITH GFR
Anion gap: 9 (ref 5–15)
BUN: 35 mg/dL — ABNORMAL HIGH (ref 8–23)
CO2: 20 mmol/L — ABNORMAL LOW (ref 22–32)
Calcium: 8.6 mg/dL — ABNORMAL LOW (ref 8.9–10.3)
Chloride: 109 mmol/L (ref 98–111)
Creatinine, Ser: 0.75 mg/dL (ref 0.61–1.24)
GFR, Estimated: >60 mL/min (ref >60)
Glucose, Bld: 105 mg/dL — ABNORMAL HIGH (ref 70–99)
Potassium: 4.1 mmol/L (ref 3.5–5.1)
Sodium: 138 mmol/L (ref 135–145)

## 2024-03-24 LAB — CBC
HCT: 41.6 % (ref 39.0–52.0)
Hemoglobin: 13.8 g/dL (ref 13.0–17.0)
MCH: 32.1 pg (ref 26.0–34.0)
MCHC: 33.2 g/dL (ref 30.0–36.0)
MCV: 96.7 fL (ref 80.0–100.0)
Platelets: 168 10*3/uL (ref 150–400)
RBC: 4.3 MIL/uL (ref 4.22–5.81)
RDW: 15.5 % (ref 11.5–15.5)
WBC: 6.9 10*3/uL (ref 4.0–10.5)
nRBC: 0 % (ref 0.0–0.2)

## 2024-03-24 LAB — HEMOGLOBIN AND HEMATOCRIT, BLOOD
HCT: 39.9 % (ref 39.0–52.0)
Hemoglobin: 13.2 g/dL (ref 13.0–17.0)

## 2024-03-24 LAB — HIV ANTIBODY (ROUTINE TESTING W REFLEX): HIV Screen 4th Generation wRfx: NONREACTIVE

## 2024-03-24 SURGERY — EGD (ESOPHAGOGASTRODUODENOSCOPY)
Anesthesia: General

## 2024-03-24 MED ORDER — DIPHENHYDRAMINE HCL 50 MG/ML IJ SOLN
INTRAMUSCULAR | Status: AC
Start: 2024-03-24 — End: 2024-03-24
  Filled 2024-03-24: qty 1

## 2024-03-24 MED ORDER — FENTANYL CITRATE PF 50 MCG/ML IJ SOSY
PREFILLED_SYRINGE | INTRAMUSCULAR | Status: AC
  Filled 2024-03-24: qty 1

## 2024-03-24 MED ORDER — LIDOCAINE HCL (CARDIAC) PF 100 MG/5ML IV SOSY
PREFILLED_SYRINGE | INTRAVENOUS | Status: DC | PRN
  Administered 2024-03-24: 50 mg via INTRAVENOUS

## 2024-03-24 MED ORDER — FENTANYL CITRATE (PF) 100 MCG/2ML IJ SOLN: 12.5000 ug | Freq: Once | INTRAMUSCULAR | Status: DC

## 2024-03-24 MED ORDER — PROPOFOL 10 MG/ML IV BOLUS
INTRAVENOUS | Status: DC | PRN
  Administered 2024-03-24 (×2): 30 mg via INTRAVENOUS
  Administered 2024-03-24: 70 mg via INTRAVENOUS

## 2024-03-24 MED ORDER — FLUTICASONE PROPIONATE 50 MCG/ACT NA SUSP: 2.0000 | Freq: Two times a day (BID) | NASAL | Status: DC | PRN

## 2024-03-24 MED ORDER — FENTANYL CITRATE PF 50 MCG/ML IJ SOSY: 25.0000 ug | PREFILLED_SYRINGE | INTRAMUSCULAR | Status: DC | PRN

## 2024-03-24 MED ORDER — ONDANSETRON HCL 4 MG/2ML IJ SOLN
INTRAMUSCULAR | Status: AC
  Filled 2024-03-24: qty 2

## 2024-03-24 MED ORDER — PANTOPRAZOLE SODIUM 40 MG IV SOLR
40.0000 mg | Freq: Two times a day (BID) | INTRAVENOUS | Status: DC
  Administered 2024-03-24 – 2024-03-25 (×3): 40 mg via INTRAVENOUS
  Filled 2024-03-24 (×3): qty 10

## 2024-03-24 MED ORDER — EPINEPHRINE (ANAPHYLAXIS) 30 MG/30ML IJ SOLN
INTRAMUSCULAR | Status: DC | PRN
  Administered 2024-03-24: 1 mL

## 2024-03-24 MED ORDER — FENTANYL CITRATE (PF) 100 MCG/2ML IJ SOLN
25.0000 ug | Freq: Once | INTRAMUSCULAR | Status: AC
  Administered 2024-03-24: 25 ug via INTRAVENOUS

## 2024-03-24 MED ORDER — SUCRALFATE 1 GM/10ML PO SUSP
1.0000 g | Freq: Three times a day (TID) | ORAL | Status: DC
  Administered 2024-03-24 – 2024-03-25 (×5): 1 g via ORAL
  Filled 2024-03-24 (×6): qty 10

## 2024-03-24 MED ORDER — DIPHENHYDRAMINE HCL 50 MG/ML IJ SOLN
25.0000 mg | Freq: Once | INTRAMUSCULAR | Status: AC
  Administered 2024-03-24: 25 mg via INTRAVENOUS

## 2024-03-24 MED ORDER — EPINEPHRINE 1 MG/10ML IJ SOSY
PREFILLED_SYRINGE | INTRAMUSCULAR | Status: AC
  Filled 2024-03-24: qty 10

## 2024-03-24 NOTE — OR Nursing (Signed)
 Pt came out of procedure w/ N/V, order for Zofran  received from Anesthesia, pt developed itching and rash after administration, 25mg  Benadryl  received from Anesthesia, pt c/o 8/10 pain in epigastric area, GI made aware. Order for 25mcg Fentanyl  received from Dr. Ole Berkeley.  BP elevated, treating pain to see if BP will come down.

## 2024-03-24 NOTE — Assessment & Plan Note (Addendum)
-   The patient will be admitted to a medical telemetry bed. - Will follow serial hemoglobins and hematocrits. - Will continue IV Protonix . - The patient will be kept n.p.o. after midnight. - GI consult will be obtained. - Dr. Corky Diener was notified and is aware about the patient

## 2024-03-24 NOTE — Assessment & Plan Note (Signed)
Will continue antihypertensive therapy.

## 2024-03-24 NOTE — H&P (Addendum)
 [place]   PATIENT NAME: Anthony Bender    MR#:  [id number]  DATE OF BIRTH:  Dec 24, 1955  DATE OF ADMISSION:  03/23/2024  PRIMARY CARE PHYSICIAN: Al Hover, MD   Patient is coming from: Home  REQUESTING/REFERRING PHYSICIAN: Iver Marker, MD  CHIEF COMPLAINT:   Chief Complaint  Patient presents with   Hematemesis    HISTORY OF PRESENT ILLNESS:  Anthony Bender is a 68 y.o. Caucasian male with medical history significant for anxiety, asthma, COPD, BPH, hypertension and dyslipidemia, who presented to emergency room with acute onset of recurrent nausea and vomiting with coffee-ground emesis.  The patient had an EGD on Monday revealing Barrett's esophagus and nonbleeding ulcers and colonoscopy with polypectomy.  The patient admitted to epigastric abdominal pain as well as melena without bright red bleeding per rectum.  No dyspnea cough or wheezing hemoptysis.  No dysuria, oliguria or hematuria or flank pain.  He admitted to occipital headache without dizziness or blurred vision or paresthesias or focal muscle weakness.  The patient denies taking any blood thinners.  ED Course: When the patient came to the ER,, Heart rate was 109 respiratory rate of 29 with otherwise normal vital signs.  Labs revealed blood glucose of 138 and BUN of 36 with otherwise unremarkable CMP.  Lactic acid was 1.8 and high-sensitivity troponin I was 6.  CBC was within normal.  Coag profile was normal.  Blood group was O+ with negative antibody screen.   EKG as reviewed by me : EKG showed sinus tachycardia with a rate of 106 with low voltage QRS. Imaging: Noncontrast head CT scan showed generalized cerebral atrophy and microvascular disease changes of the supratentorial brain with no acute intracranial abnormality.  It showed mild to moderate severity bilateral maxillary sinus disease.  The patient was given 1 L bolus of IV normal saline, 4 mg of IV Zofran  and 40 mg of IV Protonix  and 4 mg of IV morphine   sulfate.  The patient will be admitted to the medical telemetry bed for further evaluation and management. PAST MEDICAL HISTORY:   Past Medical History:  Diagnosis Date   Anxiety    Aortic ectasia (HCC)    Asthma    Barrett's esophagus    BPH (benign prostatic hyperplasia)    Chronic sinusitis    Claudication of right lower extremity (HCC)    COPD (chronic obstructive pulmonary disease) (HCC)    Coronary artery calcification    DDD (degenerative disc disease), cervical    DDD (degenerative disc disease), lumbar    GERD (gastroesophageal reflux disease)    Headache    Hemorrhoids    Hyperlipidemia    Hypertension    Infection of total knee replacement (HCC)    Insomnia    Mild episode of recurrent major depressive disorder (HCC)    Neuropathy    Neuropathy, peripheral, idiopathic    Osteoarthritis    Pre-diabetes    Primary osteoarthritis of right knee    Smoker    Tobacco use    Umbilical hernia    Vitamin D deficiency     PAST SURGICAL HISTORY:   Past Surgical History:  Procedure Laterality Date   AMPUTATION TOE Left 09/06/2022   Procedure: AMPUTATION TOE;  Surgeon: Anell Baptist, DPM;  Location: [facility] ORS;  Service: Podiatry;  Laterality: Left;   APPENDECTOMY     BACK SURGERY     injection   COLONOSCOPY N/A 03/21/2024   Procedure: COLONOSCOPY;  Surgeon: [name],  DO;  Location: [facility] ENDOSCOPY;  Service: Gastroenterology;  Laterality: N/A;   ESOPHAGOGASTRODUODENOSCOPY N/A 03/15/2015   Procedure: ESOPHAGOGASTRODUODENOSCOPY (EGD);  Surgeon: Stephens Eis, MD;  Location: [facility] ENDOSCOPY;  Service: Gastroenterology;  Laterality: N/A;   ESOPHAGOGASTRODUODENOSCOPY N/A 03/21/2024   Procedure: EGD (ESOPHAGOGASTRODUODENOSCOPY);  Surgeon: [name], DO;  Location: [facility] ENDOSCOPY;  Service: Gastroenterology;  Laterality: N/A;   ESOPHAGOGASTRODUODENOSCOPY (EGD) WITH PROPOFOL  N/A 08/23/2015   Procedure: ESOPHAGOGASTRODUODENOSCOPY (EGD) WITH PROPOFOL ;  Surgeon:  Stephens Eis, MD;  Location: [facility] ENDOSCOPY;  Service: Gastroenterology;  Laterality: N/A;   Barrx Procedure   ESOPHAGOGASTRODUODENOSCOPY (EGD) WITH PROPOFOL  N/A 01/24/2016   Procedure: ESOPHAGOGASTRODUODENOSCOPY (EGD) WITH PROPOFOL ;  Surgeon: Stephens Eis, MD;  Location: [facility] ENDOSCOPY;  Service: Gastroenterology;  Laterality: N/A;   ESOPHAGOGASTRODUODENOSCOPY (EGD) WITH PROPOFOL  N/A 12/03/2022   Procedure: ESOPHAGOGASTRODUODENOSCOPY (EGD) WITH PROPOFOL ;  Surgeon: Luke Salaam, MD;  Location: [facility] ENDOSCOPY;  Service: Gastroenterology;  Laterality: N/A;   HEMOSTASIS CLIP PLACEMENT  03/21/2024   Procedure: CONTROL OF HEMORRHAGE, GI TRACT, ENDOSCOPIC, BY CLIPPING OR OVERSEWING;  Surgeon: [name], DO;  Location: [facility] ENDOSCOPY;  Service: Gastroenterology;;   HERNIA REPAIR     JOINT REPLACEMENT Left    Partial Knee Replacement   KNEE ARTHROSCOPY     PAROTIDECTOMY Left 06/30/2017   Procedure: PAROTIDECTOMY;  Surgeon: Lesly Raspberry, MD;  Location: [facility] ORS;  Service: ENT;  Laterality: Left;   POLYPECTOMY  03/21/2024   Procedure: POLYPECTOMY, INTESTINE;  Surgeon: [name], DO;  Location: [facility] ENDOSCOPY;  Service: Gastroenterology;;   reattachment of finger Left    little finger   REPLACEMENT UNICONDYLAR JOINT KNEE Left    salvia gland tumors removed     SUBMUCOSAL INJECTION  03/21/2024   Procedure: INJECTION, SUBMUCOSAL;  Surgeon: [name], DO;  Location: [facility] ENDOSCOPY;  Service: Gastroenterology;;   SUPERFICIAL PERONEAL NERVE RELEASE     SUPERFICIAL PERONEAL NERVE RELEASE Right    ARM   TOTAL KNEE ARTHROPLASTY Right 05/19/2023   Procedure: TOTAL KNEE ARTHROPLASTY;  Surgeon: Elner Hahn, MD;  Location: [facility] ORS;  Service: Orthopedics;  Laterality: Right;   TOTAL KNEE REVISION Right 06/01/2023   Procedure: TOTAL KNEE REVISION (POLY EXCHANGE) WASH OUT;  Surgeon: Elner Hahn, MD;  Location: [facility] ORS;  Service: Orthopedics;  Laterality: Right;    SOCIAL  HISTORY:   Social History   Tobacco Use   Smoking status: Every Day    Current packs/day: 1.50    Average packs/day: 1.5 packs/day for 59.0 years (88.5 ttl pk-yrs)    Types: Cigarettes    Passive exposure: Current   Smokeless tobacco: Never  Substance Use Topics   Alcohol use: Not Currently    FAMILY HISTORY:   Family History  Problem Relation Age of Onset   Diabetes Mother    Hypertension Mother    Hypertension Father     DRUG ALLERGIES:   Allergies  Allergen Reactions   Yellow Jacket Venom [Bee Venom] Anaphylaxis and Shortness Of Breath   Codeine Itching   Metaxalone Swelling   Venlafaxine Other (See Comments)    Loss of appetite, jittery   Zyban [Bupropion] Other (See Comments)    hallucinations   Fluoxetine Anxiety    anger    REVIEW OF SYSTEMS:   ROS As per history of present illness. All pertinent systems were reviewed above. Constitutional, HEENT, cardiovascular, respiratory, GI, GU, musculoskeletal, neuro, psychiatric, endocrine, integumentary and hematologic systems were reviewed and are otherwise negative/unremarkable except for positive findings mentioned above  in the HPI.   MEDICATIONS AT HOME:   Prior to Admission medications   Medication Sig Start Date End Date Taking? Authorizing Provider  albuterol  (PROVENTIL  HFA;VENTOLIN  HFA) 108 (90 Base) MCG/ACT inhaler Inhale into the lungs every 6 (six) hours as needed for wheezing or shortness of breath.   Yes [provider]  Albuterol  Sulfate 2.5 MG/0.5ML NEBU Inhale 1 each into the lungs every 6 (six) hours as needed (SOB).   Yes [provider]  aspirin EC 81 MG tablet Take 81 mg by mouth daily. 02/22/24 02/21/25 Yes [provider]  cyanocobalamin (VITAMIN B12) 1000 MCG tablet Take 1,000 mcg by mouth daily. 07/31/23  Yes [provider]  DULoxetine  (CYMBALTA ) 60 MG capsule Take 60 mg by mouth daily. 11/07/22 02/17/25 Yes [provider]  fexofenadine (ALLEGRA) 180  MG tablet Take 180 mg by mouth daily. 01/01/24 12/31/24 Yes [provider]  fluticasone  (FLONASE ) 50 MCG/ACT nasal spray Place 2 sprays into both nostrils 2 (two) times daily.  01/02/15  Yes [provider]  LYRICA  150 MG capsule Take 150 mg by mouth 3 (three) times daily.  01/02/15  Yes [provider]  nortriptyline  (PAMELOR ) 10 MG capsule Take 20 mg by mouth at bedtime. 04/23/22  Yes [provider]  omeprazole  (PRILOSEC ) 40 MG capsule Take 40 mg by mouth 2 (two) times daily.   Yes [provider]  rOPINIRole  (REQUIP ) 0.5 MG tablet Take 0.5 mg by mouth 4 (four) times daily as needed.   Yes [provider]  rosuvastatin  (CRESTOR ) 10 MG tablet Take 10 mg by mouth at bedtime.   Yes [provider]  telmisartan  (MICARDIS ) 80 MG tablet Take 0.5 tablets (40 mg total) by mouth daily. 06/26/23  Yes Sreeram, Sundra Engel, MD  TRELEGY ELLIPTA 100-62.5-25 MCG/ACT AEPB Inhale 1 puff into the lungs daily.   Yes [provider]  zolpidem  (AMBIEN ) 10 MG tablet Take 0.5 tablets (5 mg total) by mouth at bedtime. PATIENT HAS SUPPLIES 01/12/18  Yes Eappen, Saramma, MD  apixaban  (ELIQUIS ) 2.5 MG TABS tablet Take 1 tablet (2.5 mg total) by mouth 2 (two) times daily. Patient not taking: Reported on 08/17/2023 06/04/23   Rojelio Clement, PA-C  levofloxacin  (LEVAQUIN ) 500 MG tablet Take 1 tablet (500 mg total) by mouth daily. Patient not taking: Reported on 03/21/2024 06/04/23   Rojelio Clement, PA-C      VITAL SIGNS:  Blood pressure 123/77, pulse 81, temperature 98.2 F (36.8 C), temperature source Oral, resp. rate 18, SpO2 98%.  PHYSICAL EXAMINATION:  Physical Exam  GENERAL:  68 y.o.-year-old Caucasian male patient lying in the bed with no acute distress.  EYES: Pupils equal, round, reactive to light and accommodation. No scleral icterus. Extraocular muscles intact.  HEENT: Head atraumatic, normocephalic. Oropharynx and nasopharynx clear.   NECK:  Supple, no jugular venous distention. No thyroid enlargement, no tenderness.  LUNGS: Normal breath sounds bilaterally, no wheezing, rales,rhonchi or crepitation. No use of accessory muscles of respiration.  CARDIOVASCULAR: Regular rate and rhythm, S1, S2 normal. No murmurs, rubs, or gallops.  ABDOMEN: Soft, nondistended, nontender. Bowel sounds present. No organomegaly or mass.  EXTREMITIES: No pedal edema, cyanosis, or clubbing.  NEUROLOGIC: Cranial nerves II through XII are intact. Muscle strength 5/5 in all extremities. Sensation intact. Gait not checked.  PSYCHIATRIC: The patient is alert and oriented x 3.  Normal affect and good eye contact. SKIN: No obvious rash, lesion, or ulcer.   LABORATORY PANEL:   CBC Recent Labs  Lab 03/23/24 2100  WBC 10.2  HGB 15.9  HCT 46.7  PLT 203   ------------------------------------------------------------------------------------------------------------------  Chemistries  Recent Labs  Lab 03/23/24 2100  NA 138  K 3.9  CL 102  CO2 27  GLUCOSE 138*  BUN 36*  CREATININE 0.94  CALCIUM  9.0  AST 21  ALT 16  ALKPHOS 69  BILITOT 1.2   ------------------------------------------------------------------------------------------------------------------  Cardiac Enzymes No results for input(s): TROPONINI in the last 168 hours. ------------------------------------------------------------------------------------------------------------------  RADIOLOGY:  CT Head Wo Contrast Result Date: 03/23/2024 CLINICAL DATA:  Tension headache. EXAM: CT HEAD WITHOUT CONTRAST TECHNIQUE: Contiguous axial images were obtained from the base of the skull through the vertex without intravenous contrast. RADIATION DOSE REDUCTION: This exam was performed according to the departmental dose-optimization program which includes automated exposure control, adjustment of the mA and/or kV according to patient size and/or use of iterative reconstruction technique.  COMPARISON:  March 18, 2008 FINDINGS: Brain: There is generalized cerebral atrophy with widening of the extra-axial spaces and ventricular dilatation. There are areas of decreased attenuation within the white matter tracts of the supratentorial brain, consistent with microvascular disease changes. Vascular: No hyperdense vessel or unexpected calcification. Skull: Normal. Negative for fracture or focal lesion. Sinuses/Orbits: Mild to moderate severity bilateral maxillary sinus mucosal thickening is seen. Other: None. IMPRESSION: 1. Generalized cerebral atrophy and microvascular disease changes of the supratentorial brain. 2. No acute intracranial abnormality. 3. Mild to moderate severity bilateral maxillary sinus disease. Electronically Signed   By: Virgle Grime M.D.   On: 03/23/2024 21:18      IMPRESSION AND PLAN:  Assessment and Plan: * Upper GI bleeding - The patient will be admitted to a medical telemetry bed. - Will follow serial hemoglobins and hematocrits. - Will continue IV Protonix . - The patient will be kept n.p.o. after midnight. - GI consult will be obtained. - Dr. Corky Diener was notified and is aware about the patient  Essential hypertension - Will continue antihypertensive therapy.  Peripheral neuropathy - Will continue Lyrica .  Chronic obstructive pulmonary disease (COPD) (HCC) - Will continue his bronchodilator therapy.   DVT prophylaxis: SCDs.  Medical prophylaxis currently contraindicated due to GI bleeding.. Advanced Care Planning:  Code Status: full code. Family Communication:  The plan of care was discussed in details with the patient (and family). I answered all questions. The patient agreed to proceed with the above mentioned plan. Further management will depend upon hospital course. Disposition Plan: Back to previous home environment Consults called: GI. All the records are reviewed and case discussed with ED provider.  Status is: Inpatient  At the time of  the admission, it appears that the appropriate admission status for this patient is inpatient.  This is judged to be reasonable and necessary in order to provide the required intensity of service to ensure the patient's safety given the presenting symptoms, physical exam findings and initial radiographic and laboratory data in the context of comorbid conditions.  The patient requires inpatient status due to high intensity of service, high risk of further deterioration and high frequency of surveillance required.  I certify that at the time of admission, it is my clinical judgment that the patient will require inpatient hospital care extending more than 2 midnights.                            Dispo: The patient is from: Home              Anticipated  d/c is to: Home              Patient currently is not medically stable to d/c.              Difficult to place patient: No  Virgene Griffin M.D on 03/24/2024 at 1:39 AM  [facility]   From 7 PM-7 AM, contact night-coverage [email or web address]  CC: Primary care physician; Al Hover, MD

## 2024-03-24 NOTE — Consult Note (Signed)
 Marnee Sink, MD [facility]  [address], [address] [place], [place] [address] Phone: [phone] Fax : [phone]  Consultation  Referring Provider:     Dr. Achilles Holes Primary Care Physician:  Al Hover, MD Primary Gastroenterologist:  Dr. Mamie Searles         Reason for Consultation:     Hematemesis  Date of Admission:  03/23/2024 Date of Consultation:  03/24/2024         HPI:   TRAYE BATES is a 68 y.o. male with a history of Barrett's esophagus and adenomatous polyps who had an EGD and colonoscopy 2 days ago with colon polyps found and removed and an upper endoscopy showing:  Impression:  - Duodenitis. - Gastritis. Biopsied.  - Non- bleeding gastric ulcers with a clean ulcer base ( Forrest Class III) .  - Esophagogastric landmarks identified.  - Esophageal mucosal changes classified as Barrett' s stage C4- M5 per Prague criteria. Biopsied.  The patient has had multiple upper endoscopies by Dr. Janine Melbourne and Dr. Antony Baumgartner in the past with peptic ulcer disease and Barrett's esophagus.  The patient had been taking Goody powders and has a long history of tobacco use.  The patient reported a large amount of vomiting coffee grounds with a subsequent smaller amounts.  The patient reports that he has stopped taking NSAIDs since having the endoscopy.  The patient did have some epigastric pain yesterday when this all started but denies any abdominal pain now.  Past Medical History:  Diagnosis Date   Anxiety    Aortic ectasia (HCC)    Asthma    Barrett's esophagus    BPH (benign prostatic hyperplasia)    Chronic sinusitis    Claudication of right lower extremity (HCC)    COPD (chronic obstructive pulmonary disease) (HCC)    Coronary artery calcification    DDD (degenerative disc disease), cervical    DDD (degenerative disc disease), lumbar    GERD (gastroesophageal reflux disease)    Headache    Hemorrhoids    Hyperlipidemia    Hypertension    Infection of total knee replacement (HCC)     Insomnia    Mild episode of recurrent major depressive disorder (HCC)    Neuropathy    Neuropathy, peripheral, idiopathic    Osteoarthritis    Pre-diabetes    Primary osteoarthritis of right knee    Smoker    Tobacco use    Umbilical hernia    Vitamin D deficiency     Past Surgical History:  Procedure Laterality Date   AMPUTATION TOE Left 09/06/2022   Procedure: AMPUTATION TOE;  Surgeon: Anell Baptist, DPM;  Location: [facility] ORS;  Service: Podiatry;  Laterality: Left;   APPENDECTOMY     BACK SURGERY     injection   COLONOSCOPY N/A 03/21/2024   Procedure: COLONOSCOPY;  Surgeon: [name], DO;  Location: [facility] ENDOSCOPY;  Service: Gastroenterology;  Laterality: N/A;   ESOPHAGOGASTRODUODENOSCOPY N/A 03/15/2015   Procedure: ESOPHAGOGASTRODUODENOSCOPY (EGD);  Surgeon: Stephens Eis, MD;  Location: [facility] ENDOSCOPY;  Service: Gastroenterology;  Laterality: N/A;   ESOPHAGOGASTRODUODENOSCOPY N/A 03/21/2024   Procedure: EGD (ESOPHAGOGASTRODUODENOSCOPY);  Surgeon: [name], DO;  Location: [facility] ENDOSCOPY;  Service: Gastroenterology;  Laterality: N/A;   ESOPHAGOGASTRODUODENOSCOPY (EGD) WITH PROPOFOL  N/A 08/23/2015   Procedure: ESOPHAGOGASTRODUODENOSCOPY (EGD) WITH PROPOFOL ;  Surgeon: Stephens Eis, MD;  Location: [facility] ENDOSCOPY;  Service: Gastroenterology;  Laterality: N/A;   Barrx Procedure   ESOPHAGOGASTRODUODENOSCOPY (EGD) WITH PROPOFOL  N/A 01/24/2016   Procedure: ESOPHAGOGASTRODUODENOSCOPY (EGD)  WITH PROPOFOL ;  Surgeon: Stephens Eis, MD;  Location: [facility] ENDOSCOPY;  Service: Gastroenterology;  Laterality: N/A;   ESOPHAGOGASTRODUODENOSCOPY (EGD) WITH PROPOFOL  N/A 12/03/2022   Procedure: ESOPHAGOGASTRODUODENOSCOPY (EGD) WITH PROPOFOL ;  Surgeon: Luke Salaam, MD;  Location: [facility] ENDOSCOPY;  Service: Gastroenterology;  Laterality: N/A;   HEMOSTASIS CLIP PLACEMENT  03/21/2024   Procedure: CONTROL OF HEMORRHAGE, GI TRACT, ENDOSCOPIC, BY CLIPPING OR OVERSEWING;  Surgeon: [name],  DO;  Location: [facility] ENDOSCOPY;  Service: Gastroenterology;;   HERNIA REPAIR     JOINT REPLACEMENT Left    Partial Knee Replacement   KNEE ARTHROSCOPY     PAROTIDECTOMY Left 06/30/2017   Procedure: PAROTIDECTOMY;  Surgeon: Lesly Raspberry, MD;  Location: [facility] ORS;  Service: ENT;  Laterality: Left;   POLYPECTOMY  03/21/2024   Procedure: POLYPECTOMY, INTESTINE;  Surgeon: [name], DO;  Location: [facility] ENDOSCOPY;  Service: Gastroenterology;;   reattachment of finger Left    little finger   REPLACEMENT UNICONDYLAR JOINT KNEE Left    salvia gland tumors removed     SUBMUCOSAL INJECTION  03/21/2024   Procedure: INJECTION, SUBMUCOSAL;  Surgeon: [name], DO;  Location: [facility] ENDOSCOPY;  Service: Gastroenterology;;   SUPERFICIAL PERONEAL NERVE RELEASE     SUPERFICIAL PERONEAL NERVE RELEASE Right    ARM   TOTAL KNEE ARTHROPLASTY Right 05/19/2023   Procedure: TOTAL KNEE ARTHROPLASTY;  Surgeon: Elner Hahn, MD;  Location: [facility] ORS;  Service: Orthopedics;  Laterality: Right;   TOTAL KNEE REVISION Right 06/01/2023   Procedure: TOTAL KNEE REVISION (POLY EXCHANGE) WASH OUT;  Surgeon: Elner Hahn, MD;  Location: [facility] ORS;  Service: Orthopedics;  Laterality: Right;    Prior to Admission medications   Medication Sig Start Date End Date Taking? Authorizing Provider  albuterol  (PROVENTIL  HFA;VENTOLIN  HFA) 108 (90 Base) MCG/ACT inhaler Inhale into the lungs every 6 (six) hours as needed for wheezing or shortness of breath.   Yes [provider]  Albuterol  Sulfate 2.5 MG/0.5ML NEBU Inhale 1 each into the lungs every 6 (six) hours as needed (SOB).   Yes [provider]  aspirin EC 81 MG tablet Take 81 mg by mouth daily. 02/22/24 02/21/25 Yes [provider]  cyanocobalamin (VITAMIN B12) 1000 MCG tablet Take 1,000 mcg by mouth daily. 07/31/23  Yes [provider]  DULoxetine  (CYMBALTA ) 60 MG capsule Take 60 mg by mouth daily. 11/07/22 02/17/25 Yes  [provider]  fexofenadine (ALLEGRA) 180 MG tablet Take 180 mg by mouth daily. 01/01/24 12/31/24 Yes [provider]  fluticasone  (FLONASE ) 50 MCG/ACT nasal spray Place 2 sprays into both nostrils 2 (two) times daily.  01/02/15  Yes [provider]  LYRICA  150 MG capsule Take 150 mg by mouth 3 (three) times daily.  01/02/15  Yes [provider]  nortriptyline  (PAMELOR ) 10 MG capsule Take 20 mg by mouth at bedtime. 04/23/22  Yes [provider]  omeprazole  (PRILOSEC ) 40 MG capsule Take 40 mg by mouth 2 (two) times daily.   Yes [provider]  rOPINIRole  (REQUIP ) 0.5 MG tablet Take 0.5 mg by mouth 4 (four) times daily as needed.   Yes [provider]  rosuvastatin  (CRESTOR ) 10 MG tablet Take 10 mg by mouth at bedtime.   Yes [provider]  telmisartan  (MICARDIS ) 80 MG tablet Take 0.5 tablets (40 mg total) by mouth daily. 06/26/23  Yes Sreeram, Sundra Engel, MD  TRELEGY ELLIPTA 100-62.5-25 MCG/ACT AEPB Inhale 1 puff into the lungs daily.   Yes [provider]  zolpidem  (AMBIEN ) 10 MG tablet Take 0.5 tablets (5 mg total) by mouth at bedtime. PATIENT HAS SUPPLIES 01/12/18  Yes Eappen, Saramma, MD  apixaban  (ELIQUIS ) 2.5 MG TABS tablet Take 1 tablet (2.5 mg total) by mouth 2 (two) times daily. Patient not taking: Reported on 08/17/2023 06/04/23   Rojelio Clement, PA-C  levofloxacin  (LEVAQUIN ) 500 MG tablet Take 1 tablet (500 mg total) by mouth daily. Patient not taking: Reported on 03/21/2024 06/04/23   Rojelio Clement, PA-C    Family History  Problem Relation Age of Onset   Diabetes Mother    Hypertension Mother    Hypertension Father      Social History   Tobacco Use   Smoking status: Every Day    Current packs/day: 1.50    Average packs/day: 1.5 packs/day for 59.0 years (88.5 ttl pk-yrs)    Types: Cigarettes    Passive exposure: Current   Smokeless tobacco: Never  Vaping Use   Vaping status: Never Used   Substance Use Topics   Alcohol use: Not Currently   Drug use: Yes    Types: Marijuana    Comment: CBD    Allergies as of 03/23/2024 - Review Complete 03/23/2024  Allergen Reaction Noted   Yellow jacket venom [bee venom] Anaphylaxis and Shortness Of Breath 06/19/2017   Codeine Itching 03/14/2015   Metaxalone Swelling 08/14/2019   Venlafaxine Other (See Comments) 04/28/2016   Zyban [bupropion] Other (See Comments) 03/14/2015   Fluoxetine Anxiety 04/30/2018    Review of Systems:    All systems reviewed and negative except where noted in HPI.   Physical Exam:  Vital signs in last 24 hours: Temp:  [98 F (36.7 C)-98.2 F (36.8 C)] 98.2 F (36.8 C) (06/19 0531) Pulse Rate:  [73-104] 75 (06/19 0645) Resp:  [16-24] 17 (06/19 0645) BP: (98-131)/(52-87) 112/80 (06/19 0400) SpO2:  [94 %-100 %] 94 % (06/19 0645) Last BM Date : 03/23/24 General:   Pleasant, cooperative in NAD Head:  Normocephalic and atraumatic. Eyes:   No icterus.   Conjunctiva pink. PERRLA. Ears:  Normal auditory acuity. Neck:  Supple; no masses or thyroidomegaly Rectal:  Not performed. Msk:  Symmetrical without gross deformities.  Neurologic:  Alert and oriented x3;  grossly normal neurologically. Skin:  Intact without significant lesions or rashes. Psych:  Alert and cooperative. Normal affect.  LAB RESULTS: Recent Labs    03/23/24 2100 03/24/24 0353  WBC 10.2 6.9  HGB 15.9 13.8  HCT 46.7 41.6  PLT 203 168   BMET Recent Labs    03/23/24 2100 03/24/24 0353  NA 138 138  K 3.9 4.1  CL 102 109  CO2 27 20*  GLUCOSE 138* 105*  BUN 36* 35*  CREATININE 0.94 0.75  CALCIUM  9.0 8.6*   LFT Recent Labs    03/23/24 2100  PROT 7.0  ALBUMIN 3.8  AST 21  ALT 16  ALKPHOS 69  BILITOT 1.2   PT/INR Recent Labs    03/23/24 2100  LABPROT 13.2  INR 1.0    STUDIES: CT Head Wo Contrast Result Date: 03/23/2024 CLINICAL DATA:  Tension headache. EXAM: CT HEAD WITHOUT CONTRAST TECHNIQUE: Contiguous  axial images were obtained from the base of the skull through the vertex without intravenous contrast. RADIATION DOSE REDUCTION: This exam was performed according to the departmental dose-optimization program which includes automated exposure control, adjustment of the mA and/or kV according to patient size and/or use of iterative reconstruction technique. COMPARISON:  March 18, 2008 FINDINGS: Brain: There is  generalized cerebral atrophy with widening of the extra-axial spaces and ventricular dilatation. There are areas of decreased attenuation within the white matter tracts of the supratentorial brain, consistent with microvascular disease changes. Vascular: No hyperdense vessel or unexpected calcification. Skull: Normal. Negative for fracture or focal lesion. Sinuses/Orbits: Mild to moderate severity bilateral maxillary sinus mucosal thickening is seen. Other: None. IMPRESSION: 1. Generalized cerebral atrophy and microvascular disease changes of the supratentorial brain. 2. No acute intracranial abnormality. 3. Mild to moderate severity bilateral maxillary sinus disease. Electronically Signed   By: Virgle Grime M.D.   On: 03/23/2024 21:18      Impression / Plan:   Assessment: Principal Problem:   Upper GI bleeding Active Problems:   Essential hypertension   Chronic obstructive pulmonary disease (COPD) (HCC)   Peripheral neuropathy   YATES WEISGERBER is a 68 y.o. y/o male with hematemesis yesterday after having an EGD 2 days ago.  The patient had biopsies taken of his Barrett's esophagus and of his gastric ulcer.  The patient has stopped his Juluis Ok powders as reported by him.  The patient has had no further sign of any GI bleeding and his hemoglobin this morning was 13.8 when it was previously 15.9 yesterday.  His baseline is between 12.3 and 14.  Plan:  The patient had an upper endoscopy by Dr. Mamie Searles 2 days ago and came in with hematemesis.  The patient will be set up for an EGD for today.   The patient has had no further signs of any GI bleeding.  The patient has been explained the plan and agrees with it.  Thank you for involving me in the care of this patient.      LOS: 1 day   Marnee Sink, MD, MD. Sylvan Evener 03/24/2024, 8:10 AM,  Pager [phone] 7am-5pm  Check AMION for 5pm -7am coverage and on weekends   Note: This dictation was prepared with Dragon dictation along with smaller phrase technology. Any transcriptional errors that result from this process are unintentional.

## 2024-03-24 NOTE — Op Note (Signed)
 [name] Gastroenterology Patient Name: Anthony Bender Procedure Date: 03/24/2024 2:37 PM MRN: [id number] Account #: [id number] Date of Birth: [birth year] Admit Type: Inpatient Age: 68 Room: [facility] ENDO ROOM 3 Gender: Male Note Status: Finalized Instrument Name: Almyra Jain [id number] Procedure:             Upper GI endoscopy Indications:           Hematemesis Providers:             Marnee Sink MD, MD Referring MD:          No Local Md, MD (Referring MD) Medicines:             Propofol  per Anesthesia Complications:         No immediate complications. Procedure:             Pre-Anesthesia Assessment:                        - Prior to the procedure, a History and Physical was                         performed, and patient medications and allergies were                         reviewed. The patient's tolerance of previous                         anesthesia was also reviewed. The risks and benefits                         of the procedure and the sedation options and risks                         were discussed with the patient. All questions were                         answered, and informed consent was obtained. Prior                         Anticoagulants: The patient has taken no anticoagulant                         or antiplatelet agents. ASA Grade Assessment: II - A                         patient with mild systemic disease. After reviewing                         the risks and benefits, the patient was deemed in                         satisfactory condition to undergo the procedure.                        After obtaining informed consent, the endoscope was                         passed under direct vision. Throughout the procedure,  the patient's blood pressure, pulse, and oxygen                         saturations were monitored continuously. The Endoscope                         was introduced through the mouth, and advanced to the                          second part of duodenum. The upper GI endoscopy was                         accomplished without difficulty. The patient tolerated                         the procedure well. Findings:      Barrett's esophagus was present in the lower third of the esophagus.      Few superficial post biopsy esophageal ulcers with no bleeding and no       stigmata of recent bleeding were found.      One non-bleeding cratered gastric ulcer with a flat pigmented spot       (Forrest Class IIc) was found in the gastric antrum. Area was       successfully injected with 2 mL of a 0.1 mg/mL solution of epinephrine        for hemostasis. Coagulation for hemostasis using monopolar probe was       successful. For hemostasis, one hemostatic clip was successfully placed       (MR conditional). Clip manufacturer: [employer]. There was no       bleeding at the end of the procedure.      The examined duodenum was normal. Impression:            - Barrett's esophagus.                        - Esophageal ulcers with no bleeding and no stigmata                         of recent bleeding.                        - Non-bleeding gastric ulcer with a flat pigmented                         spot (Forrest Class IIc). Injected. Treated with a                         monopolar probe. Clip (MR conditional) was placed.                         Clip manufacturer: [employer].                        - Normal examined duodenum.                        - No specimens collected. Recommendation:        - Return patient to hospital ward for ongoing care.                        -  Soft diet.                        - Continue present medications.                        - No aspirin, ibuprofen, naproxen, or other                         non-steroidal anti-inflammatory drugs. Procedure Code(s):     --- Professional ---                        [phone], Esophagogastroduodenoscopy, flexible,                          transoral; with control of bleeding, any method Diagnosis Code(s):     --- Professional ---                        K92.0, Hematemesis                        K25.9, Gastric ulcer, unspecified as acute or chronic,                         without hemorrhage or perforation CPT copyright 2022 American Medical Association. All rights reserved. The codes documented in this report are preliminary and upon coder review may  be revised to meet current compliance requirements. Marnee Sink MD, MD 03/24/2024 2:56:06 PM This report has been signed electronically. Number of Addenda: 0 Note Initiated On: 03/24/2024 2:37 PM Estimated Blood Loss:  Estimated blood loss: none.      [facility]

## 2024-03-24 NOTE — Progress Notes (Signed)
 PROGRESS NOTE    [name]  UJW:[id number] DOB: 1956/04/18 DOA: 03/23/2024 PCP: Al Hover, MD   Brief Narrative:    Anthony Bender is a 68 y.o. Caucasian male with medical history significant for anxiety, asthma, COPD, BPH, hypertension and dyslipidemia, who presented to emergency room with acute onset of recurrent nausea and vomiting with coffee-ground emesis.  The patient had an EGD on Monday revealing Barrett's esophagus and nonbleeding ulcers and colonoscopy with polypectomy.  The patient admitted to epigastric abdominal pain as well as melena without bright red bleeding per rectum.  He has undergone EGD with findings of Barrett's esophagus and no stigmata of recent bleeding noted.  He was noted to have 1 nonbleeding cratered gastric ulcer which was injected with epinephrine  and hemostatic clip was placed.  He was recommended to remain on soft diet and was noted to have significant post endoscopic pain.  Assessment & Plan:   Principal Problem:   Upper GI bleeding Active Problems:   Essential hypertension   Chronic obstructive pulmonary disease (COPD) (HCC)   Peripheral neuropathy  Assessment and Plan:   Upper GI bleeding - S/p endoscopy with no significant bleeding noted.  Noted to have Barrett's esophagus as well as created gastric ulcer requiring hemostatic clip. - Will continue IV Protonix . - Appreciate GI recommendations -Continue soft diet - Pain management with IV medications as needed   Essential hypertension - Will continue antihypertensive therapy.   Peripheral neuropathy - Will continue Lyrica .   Chronic obstructive pulmonary disease (COPD) (HCC) - Will continue his bronchodilator therapy.    DVT prophylaxis:SCDs Code Status: Full Family Communication: Spouse at bedside 6/19 Disposition Plan:  Status is: Inpatient Remains inpatient appropriate because: Need for IV medications and close monitoring.   Consultants:  GI  Procedures:  EGD  6/19  Antimicrobials:  None   Subjective: Patient seen and evaluated today with noted recurrent nausea and vomiting and denies any bowel movements.  Objective: Vitals:   03/24/24 1550 03/24/24 1601 03/24/24 1603 03/24/24 1631  BP: (!) 175/94 (!) 151/87 (!) 161/86 (!) 157/79  Pulse: 70 70 71 64  Resp: (!) 25 (!) 24 14 (!) 24  Temp:      TempSrc:      SpO2: 100% 100% 100% 99%    Intake/Output Summary (Last 24 hours) at 03/24/2024 1651 Last data filed at 03/24/2024 1502 Gross per 24 hour  Intake 2041.5 ml  Output --  Net 2041.5 ml   There were no vitals filed for this visit.  Examination:  General exam: Appears calm and comfortable  Respiratory system: Clear to auscultation. Respiratory effort normal. Cardiovascular system: S1 & S2 heard, RRR.  Gastrointestinal system: Abdomen is soft Central nervous system: Alert and awake Extremities: No edema Skin: No significant lesions noted Psychiatry: Flat affect.    Data Reviewed: I have personally reviewed following labs and imaging studies  CBC: Recent Labs  Lab 03/23/24 2100 03/24/24 0353 03/24/24 0910  WBC 10.2 6.9  --   HGB 15.9 13.8 13.2  HCT 46.7 41.6 39.9  MCV 93.6 96.7  --   PLT 203 168  --    Basic Metabolic Panel: Recent Labs  Lab 03/23/24 2100 03/24/24 0353  NA 138 138  K 3.9 4.1  CL 102 109  CO2 27 20*  GLUCOSE 138* 105*  BUN 36* 35*  CREATININE 0.94 0.75  CALCIUM  9.0 8.6*   GFR: Estimated Creatinine Clearance: 90 mL/min (by C-G formula based on SCr of 0.75 mg/dL). Liver  Function Tests: Recent Labs  Lab 03/23/24 2100  AST 21  ALT 16  ALKPHOS 69  BILITOT 1.2  PROT 7.0  ALBUMIN 3.8   Recent Labs  Lab 03/23/24 2100  LIPASE 25   No results for input(s): AMMONIA in the last 168 hours. Coagulation Profile: Recent Labs  Lab 03/23/24 2100  INR 1.0   Cardiac Enzymes: No results for input(s): CKTOTAL, CKMB, CKMBINDEX, TROPONINI in the last 168 hours. BNP (last 3  results) No results for input(s): PROBNP in the last 8760 hours. HbA1C: No results for input(s): HGBA1C in the last 72 hours. CBG: No results for input(s): GLUCAP in the last 168 hours. Lipid Profile: No results for input(s): CHOL, HDL, LDLCALC, TRIG, CHOLHDL, LDLDIRECT in the last 72 hours. Thyroid Function Tests: No results for input(s): TSH, T4TOTAL, FREET4, T3FREE, THYROIDAB in the last 72 hours. Anemia Panel: No results for input(s): VITAMINB12, FOLATE, FERRITIN, TIBC, IRON, RETICCTPCT in the last 72 hours. Sepsis Labs: Recent Labs  Lab 03/23/24 2104  LATICACIDVEN 1.8    No results found for this or any previous visit (from the past 240 hours).       Radiology Studies: CT Head Wo Contrast Result Date: 03/23/2024 CLINICAL DATA:  Tension headache. EXAM: CT HEAD WITHOUT CONTRAST TECHNIQUE: Contiguous axial images were obtained from the base of the skull through the vertex without intravenous contrast. RADIATION DOSE REDUCTION: This exam was performed according to the departmental dose-optimization program which includes automated exposure control, adjustment of the mA and/or kV according to patient size and/or use of iterative reconstruction technique. COMPARISON:  March 18, 2008 FINDINGS: Brain: There is generalized cerebral atrophy with widening of the extra-axial spaces and ventricular dilatation. There are areas of decreased attenuation within the white matter tracts of the supratentorial brain, consistent with microvascular disease changes. Vascular: No hyperdense vessel or unexpected calcification. Skull: Normal. Negative for fracture or focal lesion. Sinuses/Orbits: Mild to moderate severity bilateral maxillary sinus mucosal thickening is seen. Other: None. IMPRESSION: 1. Generalized cerebral atrophy and microvascular disease changes of the supratentorial brain. 2. No acute intracranial abnormality. 3. Mild to moderate severity bilateral  maxillary sinus disease. Electronically Signed   By: Virgle Grime M.D.   On: 03/23/2024 21:18        Scheduled Meds:  budesonide-glycopyrrolate -formoterol  2 puff Inhalation BID   cyanocobalamin  1,000 mcg Oral Daily   DULoxetine   60 mg Oral Daily   irbesartan   75 mg Oral Daily   loratadine  10 mg Oral Daily   pantoprazole  (PROTONIX ) IV  40 mg Intravenous Q12H   pregabalin   150 mg Oral TID   rosuvastatin   10 mg Oral QHS   sucralfate  1 g Oral TID WC & HS   zolpidem   5 mg Oral QHS   Continuous Infusions:  sodium chloride  100 mL/hr at 03/24/24 1353     LOS: 1 day    Time spent: 55 minutes    Yanis Juma D Mason Sole, DO [facility]  If 7PM-7AM, please contact night-coverage [email or web address] 03/24/2024, 4:51 PM

## 2024-03-24 NOTE — ED Notes (Signed)
 Pt disconnected from cords to use restroom

## 2024-03-24 NOTE — Assessment & Plan Note (Signed)
-   Will continue his bronchodilator therapy.

## 2024-03-24 NOTE — Anesthesia Preprocedure Evaluation (Signed)
 Anesthesia Evaluation  Patient identified by MRN, date of birth, ID band Patient awake    Reviewed: Allergy & Precautions, NPO status , Patient's Chart, lab work & pertinent test results  History of Anesthesia Complications Negative for: history of anesthetic complications  Airway Mallampati: II  TM Distance: >3 FB Neck ROM: Full    Dental  (+) Edentulous Upper, Edentulous Lower, Dental Advidsory Given   Pulmonary neg shortness of breath, neg sleep apnea, COPD,  COPD inhaler, neg recent URI, Current Smoker and Patient abstained from smoking.   Pulmonary exam normal breath sounds clear to auscultation       Cardiovascular Exercise Tolerance: Good METShypertension, Pt. on medications (-) angina + Peripheral Vascular Disease  (-) CAD, (-) Past MI and (-) Cardiac Stents (-) dysrhythmias (-) Valvular Problems/Murmurs Rhythm:Regular Rate:Normal - Systolic murmurs    Neuro/Psych  Headaches, neg Seizures PSYCHIATRIC DISORDERS Anxiety Depression     Neuromuscular disease    GI/Hepatic hiatal hernia, PUD,GERD  Medicated and Controlled,,(+)     (-) substance abuse    Endo/Other  neg diabetes    Renal/GU negative Renal ROS     Musculoskeletal  (+) Arthritis ,    Abdominal   Peds  Hematology   Anesthesia Other Findings Past Medical History: No date: Anxiety No date: Aortic ectasia (HCC) No date: Asthma No date: Barrett's esophagus No date: BPH (benign prostatic hyperplasia) No date: COPD (chronic obstructive pulmonary disease) (HCC) No date: DDD (degenerative disc disease), lumbar No date: GERD (gastroesophageal reflux disease) No date: Headache No date: Hemorrhoids No date: Hyperlipidemia No date: Hyperlipidemia No date: Hypertension No date: Insomnia No date: Mild episode of recurrent major depressive disorder (HCC) No date: Neuropathy No date: Neuropathy, peripheral, idiopathic No date: Osteoarthritis No date:  Primary osteoarthritis of right knee No date: Smoker No date: Tobacco use No date: Umbilical hernia No date: Vitamin D deficiency  Reproductive/Obstetrics                             Anesthesia Physical Anesthesia Plan  ASA: 3  Anesthesia Plan: General   Post-op Pain Management:    Induction: Intravenous  PONV Risk Score and Plan: 1 and Treatment may vary due to age or medical condition, Propofol  infusion and TIVA  Airway Management Planned: Natural Airway and Nasal Cannula  Additional Equipment: None  Intra-op Plan:   Post-operative Plan:   Informed Consent: I have reviewed the patients History and Physical, chart, labs and discussed the procedure including the risks, benefits and alternatives for the proposed anesthesia with the patient or authorized representative who has indicated his/her understanding and acceptance.       Plan Discussed with: CRNA and Surgeon  Anesthesia Plan Comments:        Anesthesia Quick Evaluation

## 2024-03-24 NOTE — Transfer of Care (Signed)
 Immediate Anesthesia Transfer of Care Note  Patient: Anthony Bender  Procedure(s) Performed: EGD (ESOPHAGOGASTRODUODENOSCOPY)  Patient Location: PACU and Endoscopy Unit  Anesthesia Type:General  Level of Consciousness: drowsy and patient cooperative  Airway & Oxygen Therapy: Patient Spontanous Breathing  Post-op Assessment: Report given to RN and Post -op Vital signs reviewed and stable  Post vital signs: Reviewed and stable  Last Vitals:  Vitals Value Taken Time  BP 149/89 03/24/24 15:02  Temp    Pulse 73 03/24/24 15:01  Resp 16 03/24/24 15:02  SpO2 99 % 03/24/24 15:02  Vitals shown include unfiled device data.  Last Pain:  Vitals:   03/24/24 1350  TempSrc: Tympanic  PainSc: 0-No pain         Complications: No notable events documented.

## 2024-03-24 NOTE — Assessment & Plan Note (Signed)
Will continue Lyrica.

## 2024-03-25 ENCOUNTER — Encounter: Payer: Self-pay | Admitting: Gastroenterology

## 2024-03-25 DIAGNOSIS — K259 Gastric ulcer, unspecified as acute or chronic, without hemorrhage or perforation: Secondary | ICD-10-CM | POA: Diagnosis not present

## 2024-03-25 DIAGNOSIS — K922 Gastrointestinal hemorrhage, unspecified: Secondary | ICD-10-CM | POA: Diagnosis not present

## 2024-03-25 LAB — BASIC METABOLIC PANEL WITH GFR
Anion gap: 7 (ref 5–15)
BUN: 22 mg/dL (ref 8–23)
CO2: 24 mmol/L (ref 22–32)
Calcium: 8.6 mg/dL — ABNORMAL LOW (ref 8.9–10.3)
Chloride: 107 mmol/L (ref 98–111)
Creatinine, Ser: 0.81 mg/dL (ref 0.61–1.24)
GFR, Estimated: >60 mL/min (ref >60)
Glucose, Bld: 101 mg/dL — ABNORMAL HIGH (ref 70–99)
Potassium: 3.6 mmol/L (ref 3.5–5.1)
Sodium: 138 mmol/L (ref 135–145)

## 2024-03-25 LAB — CBC
HCT: 37.2 % — ABNORMAL LOW (ref 39.0–52.0)
Hemoglobin: 12.6 g/dL — ABNORMAL LOW (ref 13.0–17.0)
MCH: 32.1 pg (ref 26.0–34.0)
MCHC: 33.9 g/dL (ref 30.0–36.0)
MCV: 94.7 fL (ref 80.0–100.0)
Platelets: 176 10*3/uL (ref 150–400)
RBC: 3.93 MIL/uL — ABNORMAL LOW (ref 4.22–5.81)
RDW: 15.3 % (ref 11.5–15.5)
WBC: 8.6 10*3/uL (ref 4.0–10.5)
nRBC: 0 % (ref 0.0–0.2)

## 2024-03-25 LAB — MAGNESIUM: Magnesium: 2.1 mg/dL (ref 1.7–2.4)

## 2024-03-25 MED ORDER — OMEPRAZOLE 40 MG PO CPDR: 40.0000 mg | DELAYED_RELEASE_CAPSULE | Freq: Two times a day (BID) | ORAL | 1 refills | Status: AC

## 2024-03-25 NOTE — TOC Transition Note (Signed)
 Transition of Care [facility]) - Discharge Note   Patient Details  Name: Anthony Bender MRN: [id number] Date of Birth: [birth year]  Transition of [facility] ) CM/SW Contact:  Alexandra Ice, RN Phone Number: 03/25/2024, 10:08 AM   Clinical Narrative:    Patient to discharge today, home. No discharge needs identified by TOC.    Final next level of care: Home/Self Care Barriers to Discharge: Barriers Resolved   Patient Goals and CMS Choice Patient states their goals for this hospitalization and ongoing recovery are:: go home          Discharge Placement                  Name of family member notified: Lester Crickenberger Patient and family notified of of transfer: 03/25/24  Discharge Plan and Services Additional resources added to the After Visit Summary for                    DME Agency: NA       HH Arranged: NA          Social Drivers of Health (SDOH) Interventions SDOH Screenings   Food Insecurity: No Food Insecurity (02/19/2024)   Received from [facility]  Housing: Low Risk  (02/19/2024)   Received from [facility]  Recent Concern: Housing - High Risk (11/27/2023)   Received from [facility]  Transportation Needs: No Transportation Needs (02/19/2024)   Received from [facility]  Utilities: Not At Risk (02/19/2024)   Received from [facility]  Financial Resource Strain: Low Risk  (02/19/2024)   Received [facility]  Physical Activity: Inactive (01/12/2018)  Social Connections: Moderately Isolated (01/12/2018)  Stress: Stress Concern Present (01/12/2018)  Tobacco Use: High Risk (03/24/2024)     Readmission Risk Interventions     No data to display

## 2024-03-25 NOTE — Progress Notes (Addendum)
 Pt alert and slept through the night.

## 2024-03-25 NOTE — Plan of Care (Signed)

## 2024-03-25 NOTE — Discharge Summary (Signed)
 Physician Discharge Summary  Anthony Bender ZDG:[id number] DOB: 08/06/1956 DOA: 03/23/2024  PCP: Al Hover, MD  Admit date: 03/23/2024  Discharge date: 03/25/2024  Admitted From:Home  Disposition:  Home  Recommendations for Outpatient Follow-up:  Follow up with PCP in 1-2 weeks Follow-up with gastroenterologist Dr. Mamie Searles with referral sent Patient advised on avoiding NSAIDs and tobacco use Remain on PPI as prescribed for 8 weeks Continue the home medications as prior  Home Health: None  Equipment/Devices: None  Discharge Condition:Stable  CODE STATUS: Full  Diet recommendation: Heart Healthy  Brief/Interim Summary: Anthony Bender is a 68 y.o. Caucasian male with medical history significant for anxiety, asthma, COPD, BPH, hypertension and dyslipidemia, who presented to emergency room with acute onset of recurrent nausea and vomiting with coffee-ground emesis.  The patient had an EGD on Monday revealing Barrett's esophagus and nonbleeding ulcers and colonoscopy with polypectomy.  The patient admitted to epigastric abdominal pain as well as melena without bright red bleeding per rectum.  He has undergone EGD on 6/19 with findings of Barrett's esophagus and no stigmata of recent bleeding noted.  He was noted to have 1 nonbleeding cratered gastric ulcer which was injected with epinephrine  and hemostatic clip was placed.  He was recommended to remain on soft diet and was noted to have significant post endoscopic pain.  He no longer has pain this morning and is tolerating diet without any significant complaints or concerns.  He has stable hemoglobin levels and vital signs and has been seen by GI with recommendations to discharge and follow-up with GI outpatient.  He will remain on PPI for 8 weeks as prescribed.  Discharge Diagnoses:  Principal Problem:   Upper GI bleeding Active Problems:   Essential hypertension   Chronic obstructive pulmonary disease (COPD) (HCC)   Peripheral  neuropathy  Principal discharge diagnosis: Upper GI bleeding with treatment of nonbleeding vessel at the base of a gastric ulcer with cautery and Endo Clip as well as epinephrine .  Discharge Instructions  Discharge Instructions     Ambulatory referral to Gastroenterology   Complete by: As directed    What is the reason for referral?: Other   Diet - low sodium heart healthy   Complete by: As directed    Increase activity slowly   Complete by: As directed       Allergies as of 03/25/2024       Reactions   Yellow Jacket Venom [bee Venom] Anaphylaxis, Shortness Of Breath   Codeine Itching   Metaxalone Swelling   Venlafaxine Other (See Comments)   Loss of appetite, jittery   Zyban [bupropion] Other (See Comments)   hallucinations   Fluoxetine Anxiety   anger        Medication List     STOP taking these medications    apixaban  2.5 MG Tabs tablet Commonly known as: Eliquis    levofloxacin  500 MG tablet Commonly known as: Levaquin        TAKE these medications    albuterol  108 (90 Base) MCG/ACT inhaler Commonly known as: VENTOLIN  HFA Inhale into the lungs every 6 (six) hours as needed for wheezing or shortness of breath.   Albuterol  Sulfate 2.5 MG/0.5ML Nebu Inhale 1 each into the lungs every 6 (six) hours as needed (SOB).   aspirin EC 81 MG tablet Take 81 mg by mouth daily.   cyanocobalamin 1000 MCG tablet Commonly known as: VITAMIN B12 Take 1,000 mcg by mouth daily.   DULoxetine  60 MG capsule Commonly known as: CYMBALTA  Take  60 mg by mouth daily.   fexofenadine 180 MG tablet Commonly known as: ALLEGRA Take 180 mg by mouth daily.   fluticasone  50 MCG/ACT nasal spray Commonly known as: FLONASE  Place 2 sprays into both nostrils 2 (two) times daily.   Lyrica  150 MG capsule Generic drug: pregabalin  Take 150 mg by mouth 3 (three) times daily.   nortriptyline  10 MG capsule Commonly known as: PAMELOR  Take 20 mg by mouth at bedtime.   omeprazole  40 MG  capsule Commonly known as: PRILOSEC  Take 1 capsule (40 mg total) by mouth 2 (two) times daily.   rOPINIRole  0.5 MG tablet Commonly known as: REQUIP  Take 0.5 mg by mouth 4 (four) times daily as needed.   rosuvastatin  10 MG tablet Commonly known as: CRESTOR  Take 10 mg by mouth at bedtime.   telmisartan  80 MG tablet Commonly known as: MICARDIS  Take 0.5 tablets (40 mg total) by mouth daily.   Trelegy Ellipta 100-62.5-25 MCG/ACT Aepb Generic drug: Fluticasone -Umeclidin-Vilant Inhale 1 puff into the lungs daily.   zolpidem  10 MG tablet Commonly known as: AMBIEN  Take 0.5 tablets (5 mg total) by mouth at bedtime. PATIENT HAS SUPPLIES        Follow-up Information     Al Hover, MD. Schedule an appointment as soon as possible for a visit in 1 week(s).   Specialty: Family Medicine Contact information: [address] [place] [address] [phone]         [name], DO. Go to.   Specialty: Gastroenterology Contact information: [address] [facility] [place] [address] [phone]                Allergies  Allergen Reactions   Yellow Jacket Venom [Bee Venom] Anaphylaxis and Shortness Of Breath   Codeine Itching   Metaxalone Swelling   Venlafaxine Other (See Comments)    Loss of appetite, jittery   Zyban [Bupropion] Other (See Comments)    hallucinations   Fluoxetine Anxiety    anger    Consultations: GI   Procedures/Studies: CT Head Wo Contrast Result Date: 03/23/2024 CLINICAL DATA:  Tension headache. EXAM: CT HEAD WITHOUT CONTRAST TECHNIQUE: Contiguous axial images were obtained from the base of the skull through the vertex without intravenous contrast. RADIATION DOSE REDUCTION: This exam was performed according to the departmental dose-optimization program which includes automated exposure control, adjustment of the mA and/or kV according to patient size and/or use of iterative reconstruction  technique. COMPARISON:  March 18, 2008 FINDINGS: Brain: There is generalized cerebral atrophy with widening of the extra-axial spaces and ventricular dilatation. There are areas of decreased attenuation within the white matter tracts of the supratentorial brain, consistent with microvascular disease changes. Vascular: No hyperdense vessel or unexpected calcification. Skull: Normal. Negative for fracture or focal lesion. Sinuses/Orbits: Mild to moderate severity bilateral maxillary sinus mucosal thickening is seen. Other: None. IMPRESSION: 1. Generalized cerebral atrophy and microvascular disease changes of the supratentorial brain. 2. No acute intracranial abnormality. 3. Mild to moderate severity bilateral maxillary sinus disease. Electronically Signed   By: Virgle Grime M.D.   On: 03/23/2024 21:18     Discharge Exam: Vitals:   03/25/24 0346 03/25/24 0740  BP: 109/77 104/82  Pulse: 88 91  Resp: 17 18  Temp: 98 F (36.7 C) 98 F (36.7 C)  SpO2: 98% 98%   Vitals:   03/24/24 1657 03/24/24 2011 03/25/24 0346 03/25/24 0740  BP: (!) 162/89 124/80 109/77 104/82  Pulse: 75 87 88 91  Resp:  18 16 17 18   Temp: 97.6 F (36.4 C) 97.9 F (36.6 C) 98 F (36.7 C) 98 F (36.7 C)  TempSrc: Oral   Oral  SpO2: 97% 98% 98% 98%    General: Pt is alert, awake, not in acute distress Cardiovascular: RRR, S1/S2 +, no rubs, no gallops Respiratory: CTA bilaterally, no wheezing, no rhonchi Abdominal: Soft, NT, ND, bowel sounds + Extremities: no edema, no cyanosis    The results of significant diagnostics from this hospitalization (including imaging, microbiology, ancillary and laboratory) are listed below for reference.     Microbiology: No results found for this or any previous visit (from the past 240 hours).   Labs: BNP (last 3 results) No results for input(s): BNP in the last 8760 hours. Basic Metabolic Panel: Recent Labs  Lab 03/23/24 2100 03/24/24 0353 03/25/24 0304  NA 138 138  138  K 3.9 4.1 3.6  CL 102 109 107  CO2 27 20* 24  GLUCOSE 138* 105* 101*  BUN 36* 35* 22  CREATININE 0.94 0.75 0.81  CALCIUM  9.0 8.6* 8.6*  MG  --   --  2.1   Liver Function Tests: Recent Labs  Lab 03/23/24 2100  AST 21  ALT 16  ALKPHOS 69  BILITOT 1.2  PROT 7.0  ALBUMIN 3.8   Recent Labs  Lab 03/23/24 2100  LIPASE 25   No results for input(s): AMMONIA in the last 168 hours. CBC: Recent Labs  Lab 03/23/24 2100 03/24/24 0353 03/24/24 0910 03/25/24 0304  WBC 10.2 6.9  --  8.6  HGB 15.9 13.8 13.2 12.6*  HCT 46.7 41.6 39.9 37.2*  MCV 93.6 96.7  --  94.7  PLT 203 168  --  176   Cardiac Enzymes: No results for input(s): CKTOTAL, CKMB, CKMBINDEX, TROPONINI in the last 168 hours. BNP: Invalid input(s): POCBNP CBG: No results for input(s): GLUCAP in the last 168 hours. D-Dimer No results for input(s): DDIMER in the last 72 hours. Hgb A1c No results for input(s): HGBA1C in the last 72 hours. Lipid Profile No results for input(s): CHOL, HDL, LDLCALC, TRIG, CHOLHDL, LDLDIRECT in the last 72 hours. Thyroid function studies No results for input(s): TSH, T4TOTAL, T3FREE, THYROIDAB in the last 72 hours.  Invalid input(s): FREET3 Anemia work up No results for input(s): VITAMINB12, FOLATE, FERRITIN, TIBC, IRON, RETICCTPCT in the last 72 hours. Urinalysis    Component Value Date/Time   COLORURINE YELLOW (A) 06/18/2023 2137   APPEARANCEUR CLEAR (A) 06/18/2023 2137   APPEARANCEUR Clear 07/22/2014 1144   LABSPEC 1.008 06/18/2023 2137   LABSPEC 1.005 07/22/2014 1144   PHURINE 8.0 06/18/2023 2137   GLUCOSEU NEGATIVE 06/18/2023 2137   GLUCOSEU Negative 07/22/2014 1144   HGBUR NEGATIVE 06/18/2023 2137   BILIRUBINUR NEGATIVE 06/18/2023 2137   BILIRUBINUR Negative 07/22/2014 1144   KETONESUR NEGATIVE 06/18/2023 2137   PROTEINUR NEGATIVE 06/18/2023 2137   NITRITE NEGATIVE 06/18/2023 2137   LEUKOCYTESUR NEGATIVE  06/18/2023 2137   LEUKOCYTESUR Negative 07/22/2014 1144   Sepsis Labs Recent Labs  Lab 03/23/24 2100 03/24/24 0353 03/25/24 0304  WBC 10.2 6.9 8.6   Microbiology No results found for this or any previous visit (from the past 240 hours).   Time coordinating discharge: 35 minutes  SIGNED:   Cornelius Dill, DO [facility] 03/25/2024, 10:09 AM  If 7PM-7AM, please contact night-coverage [email or web address]

## 2024-03-25 NOTE — Progress Notes (Signed)
 Marnee Sink, MD [facility]   [address], [address] [place], [place] [address] Phone: [phone] Fax : [phone]   Subjective: This patient underwent an EGD yesterday for an upper GI bleed.  The patient was found to have a ulcer in the antrum that was seen 2 days prior on an outpatient EGD.  There was a visible vessel seen that was injected with epinephrine  and cauterized with a gold probe device with a clip placed on the area.  The patient has had no further sign of GI bleeding.  He had significant pain after the procedure but that has resolved.   Objective: Vital signs in last 24 hours: Vitals:   03/24/24 1657 03/24/24 2011 03/25/24 0346 03/25/24 0740  BP: (!) 162/89 124/80 109/77 104/82  Pulse: 75 87 88 91  Resp: 18 16 17 18   Temp: 97.6 F (36.4 C) 97.9 F (36.6 C) 98 F (36.7 C) 98 F (36.7 C)  TempSrc: Oral   Oral  SpO2: 97% 98% 98% 98%   Weight change:   Intake/Output Summary (Last 24 hours) at 03/25/2024 0837 Last data filed at 03/24/2024 1816 Gross per 24 hour  Intake 630.99 ml  Output --  Net 630.99 ml     Exam: General: The patient is doing well today without any complaints.  He is alert and oriented x 3 in no apparent distress   Lab Results: @LABTEST2 @ Micro Results: No results found for this or any previous visit (from the past 240 hours). Studies/Results: CT Head Wo Contrast Result Date: 03/23/2024 CLINICAL DATA:  Tension headache. EXAM: CT HEAD WITHOUT CONTRAST TECHNIQUE: Contiguous axial images were obtained from the base of the skull through the vertex without intravenous contrast. RADIATION DOSE REDUCTION: This exam was performed according to the departmental dose-optimization program which includes automated exposure control, adjustment of the mA and/or kV according to patient size and/or use of iterative reconstruction technique. COMPARISON:  March 18, 2008 FINDINGS: Brain: There is generalized cerebral atrophy with widening of the extra-axial spaces  and ventricular dilatation. There are areas of decreased attenuation within the white matter tracts of the supratentorial brain, consistent with microvascular disease changes. Vascular: No hyperdense vessel or unexpected calcification. Skull: Normal. Negative for fracture or focal lesion. Sinuses/Orbits: Mild to moderate severity bilateral maxillary sinus mucosal thickening is seen. Other: None. IMPRESSION: 1. Generalized cerebral atrophy and microvascular disease changes of the supratentorial brain. 2. No acute intracranial abnormality. 3. Mild to moderate severity bilateral maxillary sinus disease. Electronically Signed   By: Virgle Grime M.D.   On: 03/23/2024 21:18   Medications: I have reviewed the patient's current medications. Scheduled Meds:  budesonide-glycopyrrolate -formoterol  2 puff Inhalation BID   cyanocobalamin  1,000 mcg Oral Daily   DULoxetine   60 mg Oral Daily   irbesartan   75 mg Oral Daily   loratadine  10 mg Oral Daily   pantoprazole  (PROTONIX ) IV  40 mg Intravenous Q12H   pregabalin   150 mg Oral TID   rosuvastatin   10 mg Oral QHS   sucralfate  1 g Oral TID WC & HS   zolpidem   5 mg Oral QHS   Continuous Infusions: PRN Meds:.acetaminophen  **OR** acetaminophen , albuterol , fentaNYL , fluticasone , ondansetron  **OR** ondansetron  (ZOFRAN ) IV, rOPINIRole , traZODone   Assessment: Principal Problem:   Upper GI bleeding Active Problems:   Essential hypertension   Chronic obstructive pulmonary disease (COPD) (HCC)   Peripheral neuropathy    Plan: This patient is status post EGD with treatment of a nonbleeding vessel and the  base of an ulcer with epinephrine , cautery and a Endo Clip placed on it.  The patient has had no further sign of GI bleeding and would like to go home.  The patient should follow-up with Dr. Mamie Searles as an outpatient and should be sent home on a PPI twice daily for the next 8 weeks.  He should continue to avoid NSAIDs and has been told that smoking can  contribute to his peptic ulcer disease.  Nothing further to do from a GI point of view.  I will sign off.  Please call if any further GI concerns or questions.  We would like to thank you for the opportunity to participate in the care of Rithwik G Cornia.    LOS: 2 days   Marnee Sink, MD.FACG 03/25/2024, 8:37 AM Pager [phone] 7am-5pm  Check AMION for 5pm -7am coverage and on weekends

## 2024-04-01 NOTE — Anesthesia Postprocedure Evaluation (Signed)
 Anesthesia Post Note  Patient: CHIBUIKEM THANG  Procedure(s) Performed: EGD (ESOPHAGOGASTRODUODENOSCOPY) EGD, WITH ARGON PLASMA COAGULATION CONTROL OF HEMORRHAGE, GI TRACT, ENDOSCOPIC, BY CLIPPING OR OVERSEWING  Patient location during evaluation: Endoscopy Anesthesia Type: General Level of consciousness: awake and alert Pain management: pain level controlled Vital Signs Assessment: post-procedure vital signs reviewed and stable Respiratory status: spontaneous breathing, nonlabored ventilation, respiratory function stable and patient connected to nasal cannula oxygen Cardiovascular status: blood pressure returned to baseline and stable Postop Assessment: no apparent nausea or vomiting Anesthetic complications: no   No notable events documented.   Last Vitals:  Vitals:   03/25/24 0346 03/25/24 0740  BP: 109/77 104/82  Pulse: 88 91  Resp: 17 18  Temp: 36.7 C 36.7 C  SpO2: 98% 98%    Last Pain:  Vitals:   03/25/24 0740  TempSrc: Oral  PainSc: 0-No pain                 Prentice Murphy

## 2024-04-27 ENCOUNTER — Encounter: Payer: Self-pay | Admitting: Gastroenterology

## 2024-05-19 ENCOUNTER — Ambulatory Visit: Admitting: Registered Nurse

## 2024-05-19 ENCOUNTER — Encounter: Payer: Self-pay | Admitting: Gastroenterology

## 2024-05-19 ENCOUNTER — Ambulatory Visit
Admission: RE | Admit: 2024-05-19 | Discharge: 2024-05-19 | Disposition: A | Discharge status: Home / Self Care | Attending: Gastroenterology | Admitting: Gastroenterology

## 2024-05-19 ENCOUNTER — Encounter
Admission: RE | Disposition: A | Discharge status: Home / Self Care | Payer: Self-pay | Source: Home / Self Care | Attending: Gastroenterology

## 2024-05-19 DIAGNOSIS — K449 Diaphragmatic hernia without obstruction or gangrene: Secondary | ICD-10-CM | POA: Diagnosis not present

## 2024-05-19 DIAGNOSIS — K219 Gastro-esophageal reflux disease without esophagitis: Secondary | ICD-10-CM | POA: Diagnosis not present

## 2024-05-19 DIAGNOSIS — K2270 Barrett's esophagus without dysplasia: Secondary | ICD-10-CM | POA: Insufficient documentation

## 2024-05-19 DIAGNOSIS — Z09 Encounter for follow-up examination after completed treatment for conditions other than malignant neoplasm: Secondary | ICD-10-CM | POA: Insufficient documentation

## 2024-05-19 DIAGNOSIS — K2970 Gastritis, unspecified, without bleeding: Secondary | ICD-10-CM | POA: Diagnosis not present

## 2024-05-19 DIAGNOSIS — I739 Peripheral vascular disease, unspecified: Secondary | ICD-10-CM | POA: Insufficient documentation

## 2024-05-19 DIAGNOSIS — Z7951 Long term (current) use of inhaled steroids: Secondary | ICD-10-CM | POA: Diagnosis not present

## 2024-05-19 DIAGNOSIS — F419 Anxiety disorder, unspecified: Secondary | ICD-10-CM | POA: Insufficient documentation

## 2024-05-19 DIAGNOSIS — J449 Chronic obstructive pulmonary disease, unspecified: Secondary | ICD-10-CM | POA: Insufficient documentation

## 2024-05-19 DIAGNOSIS — I10 Essential (primary) hypertension: Secondary | ICD-10-CM | POA: Insufficient documentation

## 2024-05-19 DIAGNOSIS — Z8711 Personal history of peptic ulcer disease: Secondary | ICD-10-CM | POA: Diagnosis not present

## 2024-05-19 DIAGNOSIS — M1711 Unilateral primary osteoarthritis, right knee: Secondary | ICD-10-CM | POA: Insufficient documentation

## 2024-05-19 DIAGNOSIS — F329 Major depressive disorder, single episode, unspecified: Secondary | ICD-10-CM | POA: Diagnosis not present

## 2024-05-19 HISTORY — PX: ESOPHAGOGASTRODUODENOSCOPY: SHX5428

## 2024-05-19 SURGERY — EGD (ESOPHAGOGASTRODUODENOSCOPY)
Anesthesia: General

## 2024-05-19 MED ORDER — DEXMEDETOMIDINE HCL IN NACL 80 MCG/20ML IV SOLN
INTRAVENOUS | Status: DC | PRN
  Administered 2024-05-19 (×2): 8 ug via INTRAVENOUS

## 2024-05-19 MED ORDER — PROPOFOL 10 MG/ML IV BOLUS
INTRAVENOUS | Status: DC | PRN
  Administered 2024-05-19: 150 mg via INTRAVENOUS

## 2024-05-19 MED ORDER — LIDOCAINE HCL (CARDIAC) PF 100 MG/5ML IV SOSY
PREFILLED_SYRINGE | INTRAVENOUS | Status: DC | PRN
  Administered 2024-05-19: 100 mg via INTRAVENOUS

## 2024-05-19 MED ORDER — SODIUM CHLORIDE 0.9 % IV SOLN: INTRAVENOUS | Status: DC

## 2024-05-19 NOTE — Transfer of Care (Signed)
 Immediate Anesthesia Transfer of Care Note  Patient: Anthony Bender  Procedure(s) Performed: EGD (ESOPHAGOGASTRODUODENOSCOPY)  Patient Location: Endoscopy Unit  Anesthesia Type:General  Level of Consciousness: drowsy and patient cooperative  Airway & Oxygen Therapy: Patient Spontanous Breathing  Post-op Assessment: Report given to RN and Post -op Vital signs reviewed and stable  Post vital signs: Reviewed and stable  Last Vitals:  Vitals Value Taken Time  BP 111/78 05/19/24 08:46  Temp 36.2 C 05/19/24 08:46  Pulse 75 05/19/24 08:47  Resp 19 05/19/24 08:47  SpO2 97 % 05/19/24 08:47  Vitals shown include unfiled device data.  Last Pain:  Vitals:   05/19/24 0846  TempSrc: Temporal  PainSc: Asleep         Complications: No notable events documented.

## 2024-05-19 NOTE — H&P (Signed)
 Pre-Procedure H&P   Patient ID: Anthony Bender is a 68 y.o. male.  Gastroenterology Provider: Elspeth Ozell Jungling, DO  Referring Provider: Dr. Rudy PCP: Rudy Alyce RAMAN, MD  Date: 05/19/2024  HPI Anthony Bender is a 68 y.o. male who presents today for Esophagogastroduodenoscopy for gastric ulcers, barretts esophagus .  Patient underwent EGD in February 2024 for melena with multiple Forrest grade 3 ulcers in the stomach.  This is secondary to Mobic and Goody powder use.  He underwent follow-up EGD for Barrett's surveillance.  He was noted to have a C4 M5 area of Barrett's that did not have dysplasia on biopsy.  EGD being repeated today after patient has been on twice a day PPI for improvement in ulcers, as gastric ulcers were also appreciated on June EGD.  Denies any further melena or hematochezia.  Hemoglobin 15 MCV 92 platelets 219,000 creatinine 1.0   Past Medical History:  Diagnosis Date   Anxiety    Aortic ectasia (HCC)    Asthma    Barrett's esophagus    BPH (benign prostatic hyperplasia)    Chronic sinusitis    Claudication of right lower extremity (HCC)    COPD (chronic obstructive pulmonary disease) (HCC)    Coronary artery calcification    DDD (degenerative disc disease), cervical    DDD (degenerative disc disease), lumbar    GERD (gastroesophageal reflux disease)    Headache    Hemorrhoids    Hyperlipidemia    Hypertension    Infection of total knee replacement (HCC)    Insomnia    Mild episode of recurrent major depressive disorder (HCC)    Neuropathy    Neuropathy, peripheral, idiopathic    Osteoarthritis    Pre-diabetes    Primary osteoarthritis of right knee    Smoker    Tobacco use    Umbilical hernia    Vitamin D deficiency     Past Surgical History:  Procedure Laterality Date   AMPUTATION TOE Left 09/06/2022   Procedure: AMPUTATION TOE;  Surgeon: Ashley Soulier, DPM;  Location: [facility] ORS;  Service: Podiatry;  Laterality: Left;    APPENDECTOMY     BACK SURGERY     injection   COLONOSCOPY N/A 03/21/2024   Procedure: COLONOSCOPY;  Surgeon: Jungling Elspeth Ozell, DO;  Location: [facility] ENDOSCOPY;  Service: Gastroenterology;  Laterality: N/A;   ESOPHAGOGASTRODUODENOSCOPY N/A 03/15/2015   Procedure: ESOPHAGOGASTRODUODENOSCOPY (EGD);  Surgeon: Deward CINDERELLA Piedmont, MD;  Location: [facility] ENDOSCOPY;  Service: Gastroenterology;  Laterality: N/A;   ESOPHAGOGASTRODUODENOSCOPY N/A 03/21/2024   Procedure: EGD (ESOPHAGOGASTRODUODENOSCOPY);  Surgeon: Jungling Elspeth Ozell, DO;  Location: [facility] ENDOSCOPY;  Service: Gastroenterology;  Laterality: N/A;   ESOPHAGOGASTRODUODENOSCOPY N/A 03/24/2024   Procedure: EGD (ESOPHAGOGASTRODUODENOSCOPY);  Surgeon: Jinny Carmine, MD;  Location: [facility] ENDOSCOPY;  Service: Endoscopy;  Laterality: N/A;   ESOPHAGOGASTRODUODENOSCOPY (EGD) WITH PROPOFOL  N/A 08/23/2015   Procedure: ESOPHAGOGASTRODUODENOSCOPY (EGD) WITH PROPOFOL ;  Surgeon: Deward CINDERELLA Piedmont, MD;  Location: [facility] ENDOSCOPY;  Service: Gastroenterology;  Laterality: N/A;   Barrx Procedure   ESOPHAGOGASTRODUODENOSCOPY (EGD) WITH PROPOFOL  N/A 01/24/2016   Procedure: ESOPHAGOGASTRODUODENOSCOPY (EGD) WITH PROPOFOL ;  Surgeon: Deward CINDERELLA Piedmont, MD;  Location: [facility] ENDOSCOPY;  Service: Gastroenterology;  Laterality: N/A;   ESOPHAGOGASTRODUODENOSCOPY (EGD) WITH PROPOFOL  N/A 12/03/2022   Procedure: ESOPHAGOGASTRODUODENOSCOPY (EGD) WITH PROPOFOL ;  Surgeon: Therisa Bi, MD;  Location: [facility] ENDOSCOPY;  Service: Gastroenterology;  Laterality: N/A;   HEMOSTASIS CLIP PLACEMENT  03/21/2024   Procedure: CONTROL OF HEMORRHAGE, GI TRACT, ENDOSCOPIC, BY CLIPPING OR OVERSEWING;  Surgeon: Jungling Elspeth  Ozell, DO;  Location: [facility] ENDOSCOPY;  Service: Gastroenterology;;   HEMOSTASIS CLIP PLACEMENT  03/24/2024   Procedure: CONTROL OF HEMORRHAGE, GI TRACT, ENDOSCOPIC, BY CLIPPING OR OVERSEWING;  Surgeon: Jinny Carmine, MD;  Location: [facility] ENDOSCOPY;  Service: Endoscopy;;   HERNIA REPAIR     HOT HEMOSTASIS   03/24/2024   Procedure: EGD, WITH ARGON PLASMA COAGULATION;  Surgeon: Jinny Carmine, MD;  Location: [facility] ENDOSCOPY;  Service: Endoscopy;;   JOINT REPLACEMENT Left    Partial Knee Replacement   KNEE ARTHROSCOPY     PAROTIDECTOMY Left 06/30/2017   Procedure: PAROTIDECTOMY;  Surgeon: Herminio Miu, MD;  Location: [facility] ORS;  Service: ENT;  Laterality: Left;   POLYPECTOMY  03/21/2024   Procedure: POLYPECTOMY, INTESTINE;  Surgeon: Onita Elspeth Ozell, DO;  Location: [facility] ENDOSCOPY;  Service: Gastroenterology;;   reattachment of finger Left    little finger   REPLACEMENT UNICONDYLAR JOINT KNEE Left    salvia gland tumors removed     SUBMUCOSAL INJECTION  03/21/2024   Procedure: INJECTION, SUBMUCOSAL;  Surgeon: Onita Elspeth Ozell, DO;  Location: [facility] ENDOSCOPY;  Service: Gastroenterology;;   SUPERFICIAL PERONEAL NERVE RELEASE     SUPERFICIAL PERONEAL NERVE RELEASE Right    ARM   TOTAL KNEE ARTHROPLASTY Right 05/19/2023   Procedure: TOTAL KNEE ARTHROPLASTY;  Surgeon: Edie Norleen PARAS, MD;  Location: [facility] ORS;  Service: Orthopedics;  Laterality: Right;   TOTAL KNEE REVISION Right 06/01/2023   Procedure: TOTAL KNEE REVISION (POLY EXCHANGE) WASH OUT;  Surgeon: Edie Norleen PARAS, MD;  Location: [facility] ORS;  Service: Orthopedics;  Laterality: Right;    Family History No h/o GI disease or malignancy  Review of Systems  Constitutional:  Negative for activity change, appetite change, chills, diaphoresis, fatigue, fever and unexpected weight change.  HENT:  Negative for trouble swallowing and voice change.   Respiratory:  Negative for shortness of breath and wheezing.   Cardiovascular:  Negative for chest pain, palpitations and leg swelling.  Gastrointestinal:  Negative for abdominal distention, abdominal pain, anal bleeding, blood in stool, constipation, diarrhea, nausea and vomiting.  Musculoskeletal:  Negative for arthralgias and myalgias.  Skin:  Negative for color change and pallor.  Neurological:   Negative for dizziness, syncope and weakness.  Psychiatric/Behavioral:  Negative for confusion. The patient is not nervous/anxious.   All other systems reviewed and are negative.    Medications No current facility-administered medications on file prior to encounter.   Current Outpatient Medications on File Prior to Encounter  Medication Sig Dispense Refill   diazepam  (VALIUM ) 2 MG tablet Take 2 mg by mouth every 6 (six) hours as needed for anxiety.     DULoxetine  (CYMBALTA ) 60 MG capsule Take 60 mg by mouth daily.     LYRICA  150 MG capsule Take 150 mg by mouth 3 (three) times daily.   0   nortriptyline  (PAMELOR ) 10 MG capsule Take 20 mg by mouth at bedtime.     omeprazole  (PRILOSEC ) 40 MG capsule Take 1 capsule (40 mg total) by mouth 2 (two) times daily. 60 capsule 1   rosuvastatin  (CRESTOR ) 10 MG tablet Take 10 mg by mouth at bedtime.     SUMAtriptan  (IMITREX ) 25 MG tablet Take 25 mg by mouth every 2 (two) hours as needed for migraine. May repeat in 2 hours if headache persists or recurs.     TRELEGY ELLIPTA 100-62.5-25 MCG/ACT AEPB Inhale 1 puff into the lungs daily.     albuterol  (PROVENTIL  HFA;VENTOLIN  HFA) 108 (90 Base) MCG/ACT inhaler Inhale into the  lungs every 6 (six) hours as needed for wheezing or shortness of breath.     Albuterol  Sulfate 2.5 MG/0.5ML NEBU Inhale 1 each into the lungs every 6 (six) hours as needed (SOB).     aspirin EC 81 MG tablet Take 81 mg by mouth daily.     cyanocobalamin  (VITAMIN B12) 1000 MCG tablet Take 1,000 mcg by mouth daily.     fexofenadine (ALLEGRA) 180 MG tablet Take 180 mg by mouth daily.     fluticasone  (FLONASE ) 50 MCG/ACT nasal spray Place 2 sprays into both nostrils 2 (two) times daily.   0   rOPINIRole  (REQUIP ) 0.5 MG tablet Take 0.5 mg by mouth 4 (four) times daily as needed.     telmisartan  (MICARDIS ) 80 MG tablet Take 0.5 tablets (40 mg total) by mouth daily.     zolpidem  (AMBIEN ) 10 MG tablet Take 0.5 tablets (5 mg total) by mouth at  bedtime. PATIENT HAS SUPPLIES 30 tablet 0    Pertinent medications related to GI and procedure were reviewed by me with the patient prior to the procedure   Current Facility-Administered Medications:    0.9 %  sodium chloride  infusion, , Intravenous, Continuous, Onita Elspeth Sharper, DO, Last Rate: 20 mL/hr at 05/19/24 0807, New Bag at 05/19/24 0807  sodium chloride  20 mL/hr at 05/19/24 0807       Allergies  Allergen Reactions   Yellow Jacket Venom [Bee Venom] Anaphylaxis and Shortness Of Breath   Codeine Itching   Metaxalone Swelling   Venlafaxine Other (See Comments)    Loss of appetite, jittery   Zyban [Bupropion] Other (See Comments)    hallucinations   Fluoxetine Anxiety    anger   Allergies were reviewed by me prior to the procedure  Objective   Body mass index is 25.4 kg/m. Vitals:   05/19/24 0744 05/19/24 0753  BP:  (!) 148/98  Pulse:  79  Resp:  16  Temp:  (!) 96.6 F (35.9 C)  TempSrc:  Temporal  SpO2:  98%  Weight: 80.3 kg   Height: 5' 10 (1.778 m)      Physical Exam Vitals and nursing note reviewed.  Constitutional:      General: He is not in acute distress.    Appearance: Normal appearance. He is not ill-appearing, toxic-appearing or diaphoretic.  HENT:     Head: Normocephalic and atraumatic.     Nose: Nose normal.     Mouth/Throat:     Mouth: Mucous membranes are moist.     Pharynx: Oropharynx is clear.  Eyes:     General: No scleral icterus.    Extraocular Movements: Extraocular movements intact.  Cardiovascular:     Rate and Rhythm: Normal rate and regular rhythm.     Heart sounds: Normal heart sounds. No murmur heard.    No friction rub. No gallop.  Pulmonary:     Effort: Pulmonary effort is normal. No respiratory distress.     Breath sounds: Normal breath sounds. No wheezing, rhonchi or rales.  Abdominal:     General: Bowel sounds are normal. There is no distension.     Palpations: Abdomen is soft.     Tenderness: There is no  abdominal tenderness. There is no guarding or rebound.  Musculoskeletal:     Cervical back: Neck supple.     Right lower leg: No edema.     Left lower leg: No edema.  Skin:    General: Skin is warm and dry.     Coloration:  Skin is not jaundiced or pale.  Neurological:     General: No focal deficit present.     Mental Status: He is alert and oriented to person, place, and time. Mental status is at baseline.  Psychiatric:        Mood and Affect: Mood normal.        Behavior: Behavior normal.        Thought Content: Thought content normal.        Judgment: Judgment normal.      Assessment:  Anthony Bender is a 68 y.o. male  who presents today for Esophagogastroduodenoscopy for gastric ulcers, barretts esophagus .  Plan:  Esophagogastroduodenoscopy with possible intervention today  Esophagogastroduodenoscopy with possible biopsy, control of bleeding, polypectomy, and interventions as necessary has been discussed with the patient/patient representative. Informed consent was obtained from the patient/patient representative after explaining the indication, nature, and risks of the procedure including but not limited to death, bleeding, perforation, missed neoplasm/lesions, cardiorespiratory compromise, and reaction to medications. Opportunity for questions was given and appropriate answers were provided. Patient/patient representative has verbalized understanding is amenable to undergoing the procedure.   Elspeth Ozell Jungling, DO  [facility]  Portions of the record may have been created with voice recognition software. Occasional wrong-word or 'sound-a-like' substitutions may have occurred due to the inherent limitations of voice recognition software.  Read the chart carefully and recognize, using context, where substitutions may have occurred.

## 2024-05-19 NOTE — Interval H&P Note (Signed)
 History and Physical Interval Note: Preprocedure H&P from 05/19/24  was reviewed and there was no interval change after seeing and examining the patient.  Written consent was obtained from the patient after discussion of risks, benefits, and alternatives. Patient has consented to proceed with Esophagogastroduodenoscopy with possible intervention   05/19/2024 8:25 AM  Anthony Bender  has presented today for surgery, with the diagnosis of Barrett's esophagus without dysplasia (K22.70) Gastroesophageal reflux disease without esophagitis (K21.9) Gastric ulcer, unspecified chronicity, unspecified whether gastric ulcer hemorrhage or perforation present (K25.9).  The various methods of treatment have been discussed with the patient and family. After consideration of risks, benefits and other options for treatment, the patient has consented to  Procedure(s): EGD (ESOPHAGOGASTRODUODENOSCOPY) (N/A) as a surgical intervention.  The patient's history has been reviewed, patient examined, no change in status, stable for surgery.  I have reviewed the patient's chart and labs.  Questions were answered to the patient's satisfaction.     Elspeth Ozell Jungling

## 2024-05-19 NOTE — Anesthesia Procedure Notes (Signed)
 Procedure Name: MAC Date/Time: 05/19/2024 8:31 AM  Performed by: Lorrene Camelia LABOR, CRNAPre-anesthesia Checklist: Patient identified, Emergency Drugs available, Patient being monitored and Suction available Patient Re-evaluated:Patient Re-evaluated prior to induction Oxygen Delivery Method: Simple face mask Preoxygenation: Pre-oxygenation with 100% oxygen Induction Type: IV induction Comments: pom

## 2024-05-19 NOTE — Anesthesia Postprocedure Evaluation (Signed)
 Anesthesia Post Note  Patient: Anthony Bender  Procedure(s) Performed: EGD (ESOPHAGOGASTRODUODENOSCOPY)  Patient location during evaluation: Endoscopy Anesthesia Type: General Level of consciousness: awake and alert Pain management: pain level controlled Vital Signs Assessment: post-procedure vital signs reviewed and stable Respiratory status: spontaneous breathing, nonlabored ventilation, respiratory function stable and patient connected to nasal cannula oxygen Cardiovascular status: blood pressure returned to baseline and stable Postop Assessment: no apparent nausea or vomiting Anesthetic complications: no   No notable events documented.   Last Vitals:  Vitals:   05/19/24 0902 05/19/24 0906  BP: 115/76 113/80  Pulse: 88 69  Resp: 15 14  Temp:    SpO2: 98% 100%    Last Pain:  Vitals:   05/19/24 0906  TempSrc:   PainSc: 0-No pain                 Prentice Murphy

## 2024-05-19 NOTE — Anesthesia Preprocedure Evaluation (Signed)
 Anesthesia Evaluation  Patient identified by MRN, date of birth, ID band Patient awake    Reviewed: Allergy & Precautions, NPO status , Patient's Chart, lab work & pertinent test results  History of Anesthesia Complications Negative for: history of anesthetic complications  Airway Mallampati: II  TM Distance: >3 FB Neck ROM: Full    Dental  (+) Edentulous Upper, Edentulous Lower, Dental Advidsory Given   Pulmonary neg shortness of breath, neg sleep apnea, COPD,  COPD inhaler, neg recent URI, Current Smoker and Patient abstained from smoking.   Pulmonary exam normal breath sounds clear to auscultation       Cardiovascular Exercise Tolerance: Good METShypertension, Pt. on medications (-) angina + Peripheral Vascular Disease  (-) CAD, (-) Past MI and (-) Cardiac Stents (-) dysrhythmias (-) Valvular Problems/Murmurs Rhythm:Regular Rate:Normal - Systolic murmurs    Neuro/Psych  Headaches, neg Seizures PSYCHIATRIC DISORDERS Anxiety Depression     Neuromuscular disease    GI/Hepatic hiatal hernia, PUD,GERD  Medicated and Controlled,,(+)     (-) substance abuse    Endo/Other  neg diabetes    Renal/GU negative Renal ROS     Musculoskeletal  (+) Arthritis ,    Abdominal   Peds  Hematology   Anesthesia Other Findings Past Medical History: No date: Anxiety No date: Aortic ectasia (HCC) No date: Asthma No date: Barrett's esophagus No date: BPH (benign prostatic hyperplasia) No date: COPD (chronic obstructive pulmonary disease) (HCC) No date: DDD (degenerative disc disease), lumbar No date: GERD (gastroesophageal reflux disease) No date: Headache No date: Hemorrhoids No date: Hyperlipidemia No date: Hyperlipidemia No date: Hypertension No date: Insomnia No date: Mild episode of recurrent major depressive disorder (HCC) No date: Neuropathy No date: Neuropathy, peripheral, idiopathic No date: Osteoarthritis No date:  Primary osteoarthritis of right knee No date: Smoker No date: Tobacco use No date: Umbilical hernia No date: Vitamin D deficiency  Reproductive/Obstetrics                             Anesthesia Physical Anesthesia Plan  ASA: 3  Anesthesia Plan: General   Post-op Pain Management:    Induction: Intravenous  PONV Risk Score and Plan: 1 and Treatment may vary due to age or medical condition, Propofol  infusion and TIVA  Airway Management Planned: Natural Airway and Nasal Cannula  Additional Equipment: None  Intra-op Plan:   Post-operative Plan:   Informed Consent: I have reviewed the patients History and Physical, chart, labs and discussed the procedure including the risks, benefits and alternatives for the proposed anesthesia with the patient or authorized representative who has indicated his/her understanding and acceptance.       Plan Discussed with: CRNA and Surgeon  Anesthesia Plan Comments:        Anesthesia Quick Evaluation

## 2024-05-19 NOTE — Op Note (Signed)
 [facility] Patient Name: Anthony Bender Procedure Date: 05/19/2024 8:12 AM MRN: [id number] Account #: [id number] Date of Birth: 11/20/1955 Admit Type: Outpatient Age: 68 Room: [facility] ENDO ROOM 1 Gender: Male Note Status: Supervisor Override Instrument Name: Barnie GI Scope [id number] Procedure:             Upper GI endoscopy Indications:           Gastro-esophageal reflux disease, For endoscopic                         therapy of Barrett's esophagus without dysplasia,                         Follow-up of acute gastric ulcer with hemorrhage Providers:             Elspeth Ozell Onita ROSALEA, DO Referring MD:          Alyce Centers, MD Medicines:             Monitored Anesthesia Care Complications:         No immediate complications. Estimated blood loss:                         Minimal. Procedure:             Pre-Anesthesia Assessment:                        - Prior to the procedure, a History and Physical was                         performed, and patient medications and allergies were                         reviewed. The patient is competent. The risks and                         benefits of the procedure and the sedation options and                         risks were discussed with the patient. All questions                         were answered and informed consent was obtained.                         Patient identification and proposed procedure were                         verified by the physician, the nurse, the anesthetist                         and the technician in the endoscopy suite. Mental                         Status Examination: alert and oriented. Airway                         Examination: normal oropharyngeal airway and neck  mobility. Respiratory Examination: clear to                         auscultation. CV Examination: RRR, no murmurs, no S3                         or S4. Prophylactic Antibiotics: The patient  does not                         require prophylactic antibiotics. Prior                         Anticoagulants: The patient has taken no anticoagulant                         or antiplatelet agents. ASA Grade Assessment: III - A                         patient with severe systemic disease. After reviewing                         the risks and benefits, the patient was deemed in                         satisfactory condition to undergo the procedure. The                         anesthesia plan was to use monitored anesthesia care                         (MAC). Immediately prior to administration of                         medications, the patient was re-assessed for adequacy                         to receive sedatives. The heart rate, respiratory                         rate, oxygen saturations, blood pressure, adequacy of                         pulmonary ventilation, and response to care were                         monitored throughout the procedure. The physical                         status of the patient was re-assessed after the                         procedure.                        After obtaining informed consent, the endoscope was                         passed under direct vision. Throughout the procedure,  the patient's blood pressure, pulse, and oxygen                         saturations were monitored continuously. The Endoscope                         was introduced through the mouth, and advanced to the                         second part of duodenum. The upper GI endoscopy was                         accomplished without difficulty. The patient tolerated                         the procedure well. Findings:      The duodenal bulb, first portion of the duodenum and second portion of       the duodenum were normal. Estimated blood loss: none.      Localized mild inflammation characterized by erosions and erythema was       found in the gastric  antrum. Biopsies were taken with a cold forceps for       histology. Estimated blood loss was minimal.      A small hiatal hernia was present. Estimated blood loss: none.      There were esophageal mucosal changes classified as Barrett's stage       C4-M5 per Prague criteria present in the lower third of the esophagus.       The maximum longitudinal extent of these mucosal changes was 5 cm in       length. This was recently biopsied surveillance, so biopsies were not       repeated Imaging was performed using white light and narrow band imaging       to visualize the mucosa.      Esophagogastric landmarks were identified: the gastroesophageal junction       was found at 40 cm from the incisors.      The exam of the esophagus was otherwise normal. Impression:            - Normal duodenal bulb, first portion of the duodenum                         and second portion of the duodenum.                        - Gastritis. Biopsied.                        - Small hiatal hernia.                        - Esophageal mucosal changes classified as Barrett's                         stage C4-M5 per Prague criteria.                        - Esophagogastric landmarks identified. Recommendation:        - Patient has a contact number available for  emergencies. The signs and symptoms of potential                         delayed complications were discussed with the patient.                         Return to normal activities tomorrow. Written                         discharge instructions were provided to the patient.                        - Discharge patient to home.                        - Resume previous diet.                        - Continue present medications.                        - Continue twice a day omeprazole . Take 20-30 minutes                         prior to breakfast and supper                        - Await pathology results.                        - Repeat  upper endoscopy in 3 years for surveillance                         based on pathology results.                        - Return to referring physician as previously                         scheduled.                        - The findings and recommendations were discussed with                         the patient. Procedure Code(s):     --- Professional ---                        [phone], Esophagogastroduodenoscopy, flexible,                         transoral; with biopsy, single or multiple Diagnosis Code(s):     --- Professional ---                        K22.70, Barrett's esophagus without dysplasia                        K29.70, Gastritis, unspecified, without bleeding                        K44.9, Diaphragmatic hernia without obstruction  or                         gangrene                        K25.0, Acute gastric ulcer with hemorrhage CPT copyright 2022 American Medical Association. All rights reserved. The codes documented in this report are preliminary and upon coder review may  be revised to meet current compliance requirements. Attending Participation:      I personally performed the entire procedure. Elspeth Jungling, DO Elspeth Ozell Jungling DO, DO 05/19/2024 8:52:54 AM This report has been signed electronically. Number of Addenda: 0 Note Initiated On: 05/19/2024 8:12 AM Estimated Blood Loss:  Estimated blood loss was minimal.      [facility]

## 2024-05-20 ENCOUNTER — Encounter: Payer: Self-pay | Admitting: Gastroenterology

## 2024-05-20 LAB — SURGICAL PATHOLOGY

## 2024-08-11 ENCOUNTER — Other Ambulatory Visit (INDEPENDENT_AMBULATORY_CARE_PROVIDER_SITE_OTHER): Payer: Self-pay | Admitting: Vascular Surgery

## 2024-08-11 DIAGNOSIS — I77819 Aortic ectasia, unspecified site: Secondary | ICD-10-CM

## 2024-08-11 DIAGNOSIS — I7025 Atherosclerosis of native arteries of other extremities with ulceration: Secondary | ICD-10-CM

## 2024-08-15 ENCOUNTER — Other Ambulatory Visit (INDEPENDENT_AMBULATORY_CARE_PROVIDER_SITE_OTHER)

## 2024-08-15 ENCOUNTER — Ambulatory Visit (INDEPENDENT_AMBULATORY_CARE_PROVIDER_SITE_OTHER): Admitting: Vascular Surgery

## 2024-08-15 ENCOUNTER — Encounter (INDEPENDENT_AMBULATORY_CARE_PROVIDER_SITE_OTHER)

## 2024-09-04 ENCOUNTER — Other Ambulatory Visit: Payer: Self-pay

## 2024-09-04 ENCOUNTER — Emergency Department
Admission: EM | Admit: 2024-09-04 | Discharge: 2024-09-04 | Disposition: A | Discharge status: Home / Self Care | Attending: Emergency Medicine | Admitting: Emergency Medicine

## 2024-09-04 DIAGNOSIS — T50995A Adverse effect of other drugs, medicaments and biological substances, initial encounter: Secondary | ICD-10-CM | POA: Insufficient documentation

## 2024-09-04 DIAGNOSIS — J449 Chronic obstructive pulmonary disease, unspecified: Secondary | ICD-10-CM | POA: Insufficient documentation

## 2024-09-04 DIAGNOSIS — I10 Essential (primary) hypertension: Secondary | ICD-10-CM | POA: Insufficient documentation

## 2024-09-04 DIAGNOSIS — F419 Anxiety disorder, unspecified: Secondary | ICD-10-CM | POA: Insufficient documentation

## 2024-09-04 DIAGNOSIS — J069 Acute upper respiratory infection, unspecified: Secondary | ICD-10-CM | POA: Diagnosis not present

## 2024-09-04 DIAGNOSIS — T50905A Adverse effect of unspecified drugs, medicaments and biological substances, initial encounter: Secondary | ICD-10-CM

## 2024-09-04 DIAGNOSIS — R0981 Nasal congestion: Secondary | ICD-10-CM | POA: Diagnosis present

## 2024-09-04 LAB — RESP PANEL BY RT-PCR (RSV, FLU A&B, COVID)  RVPGX2
Influenza A by PCR: NEGATIVE
Influenza B by PCR: NEGATIVE
Resp Syncytial Virus by PCR: NEGATIVE
SARS Coronavirus 2 by RT PCR: NEGATIVE

## 2024-09-04 MED ORDER — LORAZEPAM 1 MG PO TABS
1.0000 mg | ORAL_TABLET | Freq: Once | ORAL | Status: AC
  Administered 2024-09-04: 1 mg via ORAL
  Filled 2024-09-04: qty 1

## 2024-09-04 NOTE — ED Triage Notes (Signed)
 Pt states he took a 7 0 H pill last night to help him relax. He said it worked and he was able to go to sleep. He woke up feeling anxious and with severe nasal stuffiness and can't breathe out of his nose. He says it feels like something is stuck at the back of his nose and its worsening his anxiety.

## 2024-09-04 NOTE — Discharge Instructions (Signed)
 Your case was discussed with poison control who feels that your symptoms of anxiety and agitation are a side effect of the 7-OH that you took.  You were given Ativan which helps with your symptoms.  Your COVID/flu/RSV swab is negative.  You likely have another viral etiology of your nasal congestion.  Please return for any new, worsening, or change in symptoms or other concerns.  It was a pleasure caring for you today.

## 2024-09-04 NOTE — ED Provider Notes (Signed)
 [facility] Provider Note    Event Date/Time   First MD Initiated Contact with Patient 09/04/24 1139     (approximate)   History   Anxiety and Nasal Congestion   HPI  Anthony Bender is a 68 y.o. male with a past medical history of hypertension, COPD, DJD, who presents today for evaluation of anxiety and agitation.  He reports that he has nasal congestion which began a couple of days ago which is causing him to be anxious because he likes to breathe out of his nose, and so he took a medication that he bought called 7-OH which she had never taken before and now he feels very anxious and agitated.  He denies any chest pain or shortness of breath.  No nausea or vomiting.  Reports that he just cannot sit still.  Patient Active Problem List   Diagnosis Date Noted   Essential hypertension 03/24/2024   Chronic obstructive pulmonary disease (COPD) (HCC) 03/24/2024   Peripheral neuropathy 03/24/2024   Upper GI bleeding 03/23/2024   Atherosclerosis of native arteries of the extremities with ulceration (HCC) 08/17/2023   DJD (degenerative joint disease) 08/17/2023   Lactic acidosis 06/19/2023   Absent pedal pulses 06/19/2023   Acute respiratory failure with hypoxia (HCC) 06/19/2023   Hypovolemia 06/19/2023   Shortness of breath 06/19/2023   Hemarthrosis following procedure 06/04/2023   Severe sepsis with acute organ dysfunction (HCC) 05/28/2023   Leukocytosis 05/28/2023   Elevated troponin 05/28/2023   Depression 05/28/2023   Status post total knee replacement using cement, right 05/19/2023   Gastric ulcer 12/04/2022   UGIB (upper gastrointestinal bleed) 12/03/2022   Hypokalemia 12/03/2022   Open wound of fifth toe of left foot, initial encounter 09/05/2022   Atypical chest pain 04/27/2018   Asthma without status asthmaticus 01/12/2018   Benign prostatic hyperplasia 01/12/2018   Barrett's esophagus 01/12/2018   Hyperlipidemia, unspecified 01/12/2018    Claudication of right lower extremity 03/18/2016   DDD (degenerative disc disease), cervical 03/18/2016   Recurrent pain of both knees 03/12/2016   Low back pain with radiation 12/18/2015   Personal history of tobacco use, presenting hazards to health 10/12/2015   Chronic insomnia 08/24/2015   Presence of left artificial knee joint 07/03/2015   Primary osteoarthritis of right knee 03/07/2015   COPD (chronic obstructive pulmonary disease) (HCC) 03/21/2014   Hemorrhoids 03/21/2014   Neuropathy, peripheral, idiopathic 03/21/2014   Hypertension 03/21/2014   Vitamin D deficiency, unspecified 03/21/2014          Physical Exam   Triage Vital Signs: ED Triage Vitals [09/04/24 1122]  Encounter Vitals Group     BP 127/88     Girls Systolic BP Percentile      Girls Diastolic BP Percentile      Boys Systolic BP Percentile      Boys Diastolic BP Percentile      Pulse Rate 99     Resp (!) 22     Temp 97.6 F (36.4 C)     Temp Source Oral     SpO2 99 %     Weight      Height      Head Circumference      Peak Flow      Pain Score 0     Pain Loc      Pain Education      Exclude from Growth Chart     Most recent vital signs: Vitals:   09/04/24 1122 09/04/24 1315  BP: 127/88 117/81  Pulse: 99 73  Resp: (!) 22 18  Temp: 97.6 F (36.4 C)   SpO2: 99% 100%    Physical Exam Vitals and nursing note reviewed.  Constitutional:      General: Awake and alert. No acute distress.  Appears agitated and is pacing the room    Appearance: Normal appearance. The patient is normal weight.  HENT:     Head: Normocephalic and atraumatic.     Mouth: Mucous membranes are moist.  Eyes:     General: PERRL. Normal EOMs        Right eye: No discharge.        Left eye: No discharge.     Conjunctiva/sclera: Conjunctivae normal.  Cardiovascular:     Rate and Rhythm: Normal rate and regular rhythm.     Pulses: Normal pulses.  Pulmonary:     Effort: Pulmonary effort is normal. No respiratory  distress.     Breath sounds: Normal breath sounds.  Abdominal:     Abdomen is soft. There is no abdominal tenderness. No rebound or guarding. No distention. Musculoskeletal:        General: No swelling. Normal range of motion.     Cervical back: Normal range of motion and neck supple.  Skin:    General: Skin is warm and dry.     Capillary Refill: Capillary refill takes less than 2 seconds.     Findings: No rash.  Neurological:     Mental Status: The patient is awake and alert.      ED Results / Procedures / Treatments   Labs (all labs ordered are listed, but only abnormal results are displayed) Labs Reviewed  RESP PANEL BY RT-PCR (RSV, FLU A&B, COVID)  [id number]     EKG     RADIOLOGY     PROCEDURES:  Critical Care performed:   Procedures   MEDICATIONS ORDERED IN ED: Medications  LORazepam (ATIVAN) tablet 1 mg (1 mg Oral Given 09/04/24 1200)     IMPRESSION / MDM / ASSESSMENT AND PLAN / ED COURSE  I reviewed the triage vital signs and the nursing notes.   Differential diagnosis includes, but is not limited to, anxiety, medication reaction, viral URI  Patient is awake and alert, hemodynamically stable and afebrile, though agitated and pacing around the room.  He keeps sniffing through his nose saying that it is clogged.  There is no visible obstruction on exam.  He has normal-appearing oropharynx.  No chest pain or feelings of shortness of breath from his lungs, only through his nose.  I discussed with poison control given his consumption of 7-OH.  They report that this dosage is symptomatic management only and recommends treatment with benzodiazepines.  He was given 1 mg of lorazepam with significant improvement of his symptoms.  He reports that he feels back to baseline.  COVID/flu/RSV swab is negative.  No chest pain or shortness of breath, does not wish to have further workup.  I do not feel that he needs further workup and that it is poison control.  EKG was  nonischemic, also reviewed by attending MD.  We discussed return precautions and outpatient follow-up.  Patient understands and agrees with plan.  Discharged in stable condition with his wife.   Patient's presentation is most consistent with acute complicated illness / injury requiring diagnostic workup.   Clinical Course as of 09/04/24 1757  Sun Sep 04, 2024  1155 Discussed with poison control.  7-OH is stimulant effects in low  dose, opioid at higher dose. Anxiety/agitation at lower dose.  Patient's dose of 120 mg is considered a low dose.  Symptoms last 1-12 hours. Supportive care until at baseline. Benzos. EKG [JP]  1248 Patient reports that he feels markedly improved and ready for discharge home [JP]    Clinical Course User Index [JP] Irasema Chalk E, PA-C     FINAL CLINICAL IMPRESSION(S) / ED DIAGNOSES   Final diagnoses:  Upper respiratory tract infection, unspecified type  Anxiety  Adverse effect of drug, initial encounter     Rx / DC Orders   ED Discharge Orders     None        Note:  This document was prepared using Dragon voice recognition software and may include unintentional dictation errors.   Evania Lyne E, PA-C 09/04/24 1757    Dorothyann Drivers, MD 09/05/24 2102

## 2024-09-04 NOTE — ED Notes (Signed)
Pt verbalizes understanding of discharge instructions. Opportunity for questioning and answers were provided. Pt discharged from ED to home with wife.    

## 2024-10-18 ENCOUNTER — Emergency Department
Admission: EM | Admit: 2024-10-18 | Discharge: 2024-10-18 | Disposition: A | Discharge status: Home / Self Care | Attending: Emergency Medicine | Admitting: Emergency Medicine

## 2024-10-18 ENCOUNTER — Emergency Department

## 2024-10-18 DIAGNOSIS — R0781 Pleurodynia: Secondary | ICD-10-CM | POA: Diagnosis present

## 2024-10-18 DIAGNOSIS — W19XXXA Unspecified fall, initial encounter: Secondary | ICD-10-CM | POA: Diagnosis not present

## 2024-10-18 DIAGNOSIS — I10 Essential (primary) hypertension: Secondary | ICD-10-CM | POA: Insufficient documentation

## 2024-10-18 DIAGNOSIS — R0789 Other chest pain: Secondary | ICD-10-CM

## 2024-10-18 DIAGNOSIS — J4489 Other specified chronic obstructive pulmonary disease: Secondary | ICD-10-CM | POA: Insufficient documentation

## 2024-10-18 MED ORDER — LIDOCAINE 5 % EX PTCH
1.0000 | MEDICATED_PATCH | Freq: Once | CUTANEOUS | Status: DC
  Administered 2024-10-18: 1 via TRANSDERMAL
  Filled 2024-10-18: qty 1

## 2024-10-18 MED ORDER — HYDROCODONE-ACETAMINOPHEN 5-325 MG PO TABS: 1.0000 | ORAL_TABLET | Freq: Three times a day (TID) | ORAL | 0 refills | Status: AC | PRN

## 2024-10-18 MED ORDER — LIDOCAINE 5 % EX PTCH: 1.0000 | MEDICATED_PATCH | Freq: Two times a day (BID) | CUTANEOUS | 0 refills | Status: AC | PRN

## 2024-10-18 NOTE — ED Triage Notes (Signed)
 Pt presents to the ED via POV from home. Pt reports falling last Monday (1/5). Reports having a negative xray of his ribs. Pt reports that the pain in his rib cage has continued. Pain is located on the right side. Pt states that he was sent her by his PCP for possible CT scan of his rib cage.

## 2024-10-18 NOTE — Progress Notes (Signed)
 (This documentation was completed in part using voice recognition software, if there are any gross dictation abnormalities, please contact the author) Assessment/Plan:  Diagnoses and all orders for this visit:  Ground-level fall  Chest wall contusion, right, initial encounter  Abdominal trauma, initial encounter  Weight loss  Essential hypertension    Assessment & Plan   Ground-level fall with chest wall and abdominal trauma Sustained a ground-level fall 8 days ago, resulting in chest wall and abdominal trauma. Reports severe pain in the right chest and abdomen, exacerbated by lying down, with difficulty breathing due to pain. Initial x-rays showed no fractures or dislocations. Differential diagnosis includes potential intraabdominal trauma renal contusion, liver injury, rib fracture, or internal bleeding etc. Concerns about possible internal damage due to persistent severe pain and nausea. Discussed the possibility of a CT scan to evaluate for internal injuries, after further discussion he was agreeable to ED evaluation, but he prefers to go to the emergency room for further evaluation and management by car and not EMS. - Referred to emergency room for trauma evaluation and further imaging, including CT scan of chest and abdomen. - Instructed to report abdominal pain and difficulty breathing to emergency room staff. - Advised to monitor for any signs of internal bleeding or worsening symptoms.  Abnormal weight loss Reports decreased appetite and nausea, potentially related to pain and recent prednisone  use. Weight loss noted since last visit, possibly due to reduced food intake.  Need to monitor closely, will follow up again after ED visit.  Essential hypertension Blood pressure elevated, likely secondary to pain from trauma. No acute cardiac symptoms reported, but pain-related hypertension is a concern.  He denies any RIGHT sided chest pain, etc.           Chief Complaint:    Anthony Bender is a 69 y.o. male in today for: Chief Complaint  Patient presents with   Rib Pain    Subjective:   HPI:   History of Present Illness History of Present Illness   Anthony Bender is a 69 year old male who presents with right chest pain following a fall.  Right chest pain - Onset eight days ago after tripping over a ferret and falling sideways onto the edge of a minimally padded couch - Severe pain, especially when lying down - Sharp pain localized along the right rib area - Pain makes breathing difficult - No tenderness to palpation of the chest wall - No back pain or spinal symptoms  Associated gastrointestinal and urinary symptoms - Nausea and decreased appetite over the past week - Constipation without blood in stool - Increased urination without hematuria - Sensation of abdominal discomfort described as similar to a 'liver punch' - No blood in urine  Pain management and medication use - Visited walk-in clinic the day after injury; chest x-rays performed - Completed courses of prednisone  and tramadol - Currently nearing the end of baclofen supply - Temporary relief from prescribed medications         Objective:   Vitals:   10/18/24 1049  BP: (!) 168/96  Pulse: 94  Temp: 36.7 C (98 F)  SpO2: 99%  Weight: 79.1 kg (174 lb 6.4 oz)  Height: 175.3 cm (5' 9.02)  PainSc:   8  PainLoc: Rib Cage   Body mass index is 25.74 kg/m. Home Vitals:    Physical Exam Constitutional: Alert, sitting in tripod position Head: Normocephalic atraumatic Ears: Normal external exam, no evidence of trauma Nose: Normal nose Neck: Normal ROM, no  rigidity, tenderness or adenopathy, no tenderness along the cervical spine Cardiovascular: Regular rate  Pulmonary: Normal effort, no rales or rhonchi or wheezing Abdominal: Increased RUQ and RLQ tenderness, no rebound or palpable mass Musculoskeletal: RIGHT lower chest wall pain Psychiatric: Normal mood and  behavior, without psychomotor agitation or retardation   Snapshot of Problems, meds, allergies, Shx:   Patient Active Problem List  Diagnosis   COPD (chronic obstructive pulmonary disease) (CMS/HHS-HCC)   Essential hypertension   GERD (gastroesophageal reflux disease)   Neuropathy, peripheral, idiopathic   Vitamin D deficiency   Hemorrhoids   Barrett's esophagus   Benign prostatic hyperplasia   Primary osteoarthritis of left knee   Primary osteoarthritis of right knee   Status post left partial knee replacement   Low back pain with radiation   DDD (degenerative disc disease), lumbar   DDD (degenerative disc disease), cervical   Pain syndrome, chronic   Claudication of right lower extremity   Mild episode of recurrent major depressive disorder ()   Dyslipidemia   Insomnia   Chronic sinusitis   Coronary artery calcification   Tobacco abuse   History of concussion   Aortic ectasia   Prediabetes   Status post total knee replacement using cement, right   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   Cigarette smoker    Outpatient Medications Prior to Visit  Medication Sig Dispense Refill   albuterol  (PROAIR  HFA) 90 mcg/actuation inhaler Inhale 2 inhalations into the lungs every 6 (six) hours as needed for Wheezing 1 each 6   albuterol  (PROVENTIL ) 2.5 mg /3 mL (0.083 %) nebulizer solution Take 3 mLs (2.5 mg total) by nebulization every 6 (six) hours as needed for Wheezing or Shortness of Breath 15 mL 3   aspirin 81 MG EC tablet Take 1 tablet (81 mg total) by mouth once daily 30 tablet 11   baclofen (LIORESAL) 10 MG tablet 1 PO TID PRN 15 tablet 0   DULoxetine  (CYMBALTA ) 60 MG DR capsule Take 1 capsule (60 mg total) by mouth once daily 90 capsule 3   fexofenadine (ALLEGRA) 180 MG tablet Take 1 tablet (180 mg total) by mouth once daily 30 tablet 3   fluticasone  propionate (FLONASE ) 50 mcg/actuation nasal spray SHAKE LIQUID AND USE 2  SPRAYS IN EACH NOSTRIL DAILY 16 g 7   nortriptyline  (PAMELOR ) 10 MG capsule TAKE 2 CAPSULES (20 MG TOTAL) BY MOUTH AT BEDTIME FOR 360 DAYS 180 capsule 3   omeprazole  (PRILOSEC ) 40 MG DR capsule TAKE 1 CAPSULE BY MOUTH 2 TIMES DAILY BEFORE MEALS. 180 capsule 2   pregabalin  (LYRICA ) 150 MG capsule TAKE 1 CAPSULE BY MOUTH THREE TIMES A DAY 90 capsule 3   rOPINIRole  (REQUIP ) 0.5 MG immediate release tablet Take 1 tablet (0.5 mg total) by mouth 3 (three) times daily You may take an addition tablet (0.5 mg) at night as needed. 360 tablet 2   rosuvastatin  (CRESTOR ) 10 MG tablet TAKE 1 TABLET BY MOUTH EVERY DAY 100 tablet 3   SUMAtriptan  (IMITREX ) 25 MG tablet      tamsulosin (FLOMAX) 0.4 mg capsule Take 1 capsule (0.4 mg total) by mouth once daily Take 30 minutes after same meal each day. 90 capsule 1   telmisartan  (MICARDIS ) 40 MG tablet Take 40 mg by mouth once daily     traMADoL (ULTRAM) 50 mg tablet Take 1 tablet (50 mg total) by mouth every 6 (six) hours as needed for up to 10 days 20 tablet 0  TRELEGY ELLIPTA 100-62.5-25 mcg inhaler INHALE 1 PUFF INTO THE LUNGS ONCE DAILY. 60 each 5   zolpidem  (AMBIEN ) 10 mg tablet Take 1 tablet (10 mg total) by mouth at bedtime as needed for Sleep 30 tablet 2   No facility-administered medications prior to visit.   Allergies  Allergen Reactions   Venom-Honey Bee Anaphylaxis and Shortness Of Breath   Codeine Itching   Bupropion Hallucination   Effexor [Venlafaxine] Other (See Comments)    Loss of appetite, jittery   Prozac [Fluoxetine] Other (See Comments)    anger   Skelaxin [Metaxalone] Swelling   Zyban [Bupropion Hcl] Hallucination    Social History:  reports that he has been smoking cigarettes. He has a 36 pack-year smoking history. He has been exposed to tobacco smoke. He has never used smokeless tobacco. He reports that he does not currently use alcohol. He reports that he does not currently use drugs.  This note has been  created in part using automated tools and reviewed for accuracy by SEAN SCOTT HARPER.

## 2024-10-18 NOTE — ED Notes (Signed)
"  Patient in CT at this time.   "

## 2024-10-18 NOTE — Discharge Instructions (Addendum)
 Your exam and CT scan did not show any evidence of an acute rib fracture.  You may experience chest wall pain related to your injury.  Use the Lidoderm  patches as directed.  Take the prescription pain medicine as needed.  Follow-up with your primary provider for ongoing evaluation.  Return to the ED if necessary.  A copy of your CT scan has been provided, for your primary provider to follow-up with routine lung cancer screening testing, as recommended

## 2024-10-21 NOTE — ED Provider Notes (Signed)
 "   [facility] Emergency Department Provider Note     Event Date/Time   First MD Initiated Contact with Patient 10/18/24 1515     (approximate)   History   Rib Injury   HPI  Anthony Bender is a 69 y.o. male with HTN, GERD, COPD, HLD asthma, and BPH, returns to the ED For evaluation of ongoing right-sided chest wall pain.  Patient sustained a mechanical fall and was evaluated here in the ED with a negative x-ray at that time.  He presents for ongoing rib pain, and was advised to report to the ED by his PCP for further evaluation including CT imaging.  Patient denies any hematemesis, shortness of breath, cough, or congestion.  He does endorse right-sided pain aggravated by deep breaths and palpation over the lower anterior lateral rib border.  No other injury reported at this time.  No fevers, chills, or sweats reported.   Physical Exam   Triage Vital Signs: ED Triage Vitals [10/18/24 1254]  Encounter Vitals Group     BP (!) 163/94     Girls Systolic BP Percentile      Girls Diastolic BP Percentile      Boys Systolic BP Percentile      Boys Diastolic BP Percentile      Pulse Rate 86     Resp 18     Temp 97.6 F (36.4 C)     Temp Source Oral     SpO2 99 %     Weight 174 lb (78.9 kg)     Height 5' 10 (1.778 m)     Head Circumference      Peak Flow      Pain Score 8     Pain Loc      Pain Education      Exclude from Growth Chart     Most recent vital signs: Vitals:   10/18/24 1254  BP: (!) 163/94  Pulse: 86  Resp: 18  Temp: 97.6 F (36.4 C)  SpO2: 99%    General Awake, no distress. NAD HEENT NCAT. PERRL. EOMI. No rhinorrhea. Mucous membranes are moist.  CV:  Good peripheral perfusion. RRR RESP:  Normal effort. CTA.  No chest wall deformity, dyskinetic movement, ecchymosis, bruising.  Mildly tender to palpation to the right inferior rib border. ABD:  No distention.  Soft and nontender MSK:  AROM of all extremities   ED Results /  Procedures / Treatments   Labs (all labs ordered are listed, but only abnormal results are displayed) Labs Reviewed - No data to display   EKG  Vent. rate 86 BPM  PR interval 142 ms  QRS duration 78 ms  QT/QTcB 364/435 ms  P-R-T axes 79 67 90  Normal sinus rhythm  Nonspecific ST abnormality No STEMI   RADIOLOGY  I personally viewed and evaluated these images as part of my medical decision making, as well as reviewing the written report by the radiologist.  ED Provider Interpretation: No acute findings  CT Chest w/o CM  IMPRESSION: 1. No acute intrathoracic process.  No displaced rib fracture. 2.  Aortic Atherosclerosis (ICD10-I70.0). 3. Pulmonary emphysema is present. Pulmonary emphysema is an independent risk factor for lung cancer. Recommend patient be considered for lung cancer screening. [place]  [employer]. Screening for Lung Cancer: [place]  Licensed Con[employer]ancer. JAMA. 2021;325(10):[id number]   PROCEDURES:  Critical Care performed: No  Procedures   MEDICATIONS ORDERED IN ED: Medications - No data  to display   IMPRESSION / MDM / ASSESSMENT AND PLAN / ED COURSE  I reviewed the triage vital signs and the nursing notes.                              Differential diagnosis includes, but is not limited to, rib fracture, pneumothorax, chest wall contusion  Patient's presentation is most consistent with acute complicated illness / injury requiring diagnostic workup.  Patient's diagnosis is consistent with right rib contusion.  Geriatric patient presents to the ED for evaluation of ongoing right-sided anterior chest wall pain following mechanical fall.  No CT evidence of any acute rib fracture or intrathoracic process, based on my completion of images.  Patient is in no acute respiratory distress.  Patient will be discharged home with prescriptions for Lidoderm  patches and hydrocodone  (#9). Patient is to follow up with his  primary provider as needed or otherwise directed. Patient is given ED precautions to return to the ED for any worsening or new symptoms.     FINAL CLINICAL IMPRESSION(S) / ED DIAGNOSES   Final diagnoses:  Pain in rib     Rx / DC Orders   ED Discharge Orders          Ordered    HYDROcodone -acetaminophen  (NORCO/VICODIN) 5-325 MG tablet  3 times daily PRN        10/18/24 1601    lidocaine  (LIDODERM ) 5 %  Every 12 hours PRN        10/18/24 1601             Note:  This document was prepared using Dragon voice recognition software and may include unintentional dictation errors.    Loyd Candida LULLA Aldona, PA-C 10/21/24 1651    Waymond Lorelle Cummins, MD 10/24/24 1340  "
